# Patient Record
Sex: Female | Born: 1990 | Race: Black or African American | Hispanic: No | Marital: Single | Smoking: Former smoker
Health system: Southern US, Community
[De-identification: ages and names within clinical notes are randomized; demographics above are authoritative.]

## PROBLEM LIST (undated history)

## (undated) DIAGNOSIS — F32A Depression, unspecified: Secondary | ICD-10-CM

## (undated) DIAGNOSIS — F329 Major depressive disorder, single episode, unspecified: Secondary | ICD-10-CM

## (undated) DIAGNOSIS — H6020 Malignant otitis externa, unspecified ear: Secondary | ICD-10-CM

## (undated) HISTORY — PX: DENTAL EXAMINATION UNDER ANESTHESIA: SHX1447

---
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
## 2008-04-22 ENCOUNTER — Emergency Department (HOSPITAL_COMMUNITY): Admission: EM | Admit: 2008-04-22 | Discharge: 2008-04-22 | Payer: Self-pay | Admitting: Emergency Medicine

## 2008-10-29 ENCOUNTER — Ambulatory Visit: Payer: Self-pay | Admitting: Internal Medicine

## 2008-10-29 ENCOUNTER — Inpatient Hospital Stay (HOSPITAL_COMMUNITY): Admission: EM | Admit: 2008-10-29 | Discharge: 2008-10-30 | Payer: Self-pay | Admitting: Emergency Medicine

## 2008-10-29 ENCOUNTER — Ambulatory Visit: Payer: Self-pay | Admitting: Psychiatry

## 2008-10-30 ENCOUNTER — Encounter: Payer: Self-pay | Admitting: Internal Medicine

## 2009-06-20 ENCOUNTER — Emergency Department (HOSPITAL_COMMUNITY): Admission: EM | Admit: 2009-06-20 | Discharge: 2009-06-20 | Payer: Self-pay | Admitting: Emergency Medicine

## 2010-02-06 ENCOUNTER — Emergency Department (HOSPITAL_COMMUNITY): Admission: EM | Admit: 2010-02-06 | Discharge: 2010-02-06 | Payer: Self-pay | Admitting: Emergency Medicine

## 2010-03-16 ENCOUNTER — Emergency Department (HOSPITAL_COMMUNITY): Admission: EM | Admit: 2010-03-16 | Discharge: 2010-03-16 | Payer: Self-pay | Admitting: Emergency Medicine

## 2010-04-22 ENCOUNTER — Emergency Department (HOSPITAL_COMMUNITY): Admission: EM | Admit: 2010-04-22 | Discharge: 2010-04-22 | Payer: Self-pay | Admitting: Emergency Medicine

## 2010-04-27 ENCOUNTER — Emergency Department (HOSPITAL_COMMUNITY): Admission: EM | Admit: 2010-04-27 | Discharge: 2010-04-27 | Payer: Self-pay | Admitting: Emergency Medicine

## 2010-06-11 ENCOUNTER — Emergency Department (HOSPITAL_COMMUNITY): Admission: EM | Admit: 2010-06-11 | Discharge: 2010-06-12 | Payer: Self-pay | Admitting: Emergency Medicine

## 2010-08-04 ENCOUNTER — Emergency Department (HOSPITAL_COMMUNITY): Admission: EM | Admit: 2010-08-04 | Discharge: 2010-08-04 | Payer: Self-pay | Admitting: Emergency Medicine

## 2010-08-10 ENCOUNTER — Emergency Department (HOSPITAL_COMMUNITY): Admission: EM | Admit: 2010-08-10 | Discharge: 2010-08-10 | Payer: Self-pay | Admitting: Emergency Medicine

## 2010-08-22 ENCOUNTER — Emergency Department (HOSPITAL_COMMUNITY): Admission: EM | Admit: 2010-08-22 | Discharge: 2010-08-22 | Payer: Self-pay | Admitting: Emergency Medicine

## 2010-09-11 ENCOUNTER — Emergency Department (HOSPITAL_COMMUNITY): Admission: EM | Admit: 2010-09-11 | Discharge: 2010-09-11 | Payer: Self-pay | Admitting: Emergency Medicine

## 2010-09-18 ENCOUNTER — Emergency Department (HOSPITAL_COMMUNITY): Admission: EM | Admit: 2010-09-18 | Discharge: 2010-09-18 | Payer: Self-pay | Admitting: Emergency Medicine

## 2010-10-28 ENCOUNTER — Emergency Department (HOSPITAL_COMMUNITY)
Admission: EM | Admit: 2010-10-28 | Discharge: 2010-10-28 | Payer: Self-pay | Source: Home / Self Care | Admitting: Emergency Medicine

## 2010-11-02 ENCOUNTER — Emergency Department (HOSPITAL_COMMUNITY)
Admission: EM | Admit: 2010-11-02 | Discharge: 2010-11-02 | Payer: Self-pay | Source: Home / Self Care | Admitting: Emergency Medicine

## 2010-12-13 ENCOUNTER — Emergency Department (HOSPITAL_COMMUNITY)
Admission: EM | Admit: 2010-12-13 | Discharge: 2010-12-13 | Payer: Self-pay | Source: Home / Self Care | Admitting: Emergency Medicine

## 2010-12-13 LAB — URINE MICROSCOPIC-ADD ON

## 2010-12-13 LAB — DIFFERENTIAL
Basophils Absolute: 0 10*3/uL (ref 0.0–0.1)
Basophils Relative: 0 % (ref 0–1)
Eosinophils Absolute: 0.1 10*3/uL (ref 0.0–0.7)
Eosinophils Relative: 1 % (ref 0–5)
Lymphs Abs: 1.3 10*3/uL (ref 0.7–4.0)
Monocytes Absolute: 0.6 10*3/uL (ref 0.1–1.0)
Monocytes Relative: 8 % (ref 3–12)
Neutro Abs: 5.8 10*3/uL (ref 1.7–7.7)
Neutrophils Relative %: 74 % (ref 43–77)

## 2010-12-13 LAB — COMPREHENSIVE METABOLIC PANEL
ALT: 13 U/L (ref 0–35)
AST: 33 U/L (ref 0–37)
Albumin: 3.9 g/dL (ref 3.5–5.2)
Alkaline Phosphatase: 69 U/L (ref 39–117)
BUN: 13 mg/dL (ref 6–23)
CO2: 25 mEq/L (ref 19–32)
Calcium: 9.3 mg/dL (ref 8.4–10.5)
Chloride: 108 mEq/L (ref 96–112)
Creatinine, Ser: 0.98 mg/dL (ref 0.4–1.2)
GFR calc Af Amer: >60 mL/min (ref >60)
GFR calc non Af Amer: >60 mL/min (ref >60)
Potassium: 4.7 mEq/L (ref 3.5–5.1)
Total Bilirubin: 1.4 mg/dL — ABNORMAL HIGH (ref 0.3–1.2)
Total Protein: 6.6 g/dL (ref 6.0–8.3)

## 2010-12-13 LAB — URINALYSIS, ROUTINE W REFLEX MICROSCOPIC
Hgb urine dipstick: NEGATIVE
Ketones, ur: 15 mg/dL — AB
Leukocytes, UA: NEGATIVE
Protein, ur: 30 mg/dL — AB
Specific Gravity, Urine: 1.023 (ref 1.005–1.030)
Urine Glucose, Fasting: NEGATIVE mg/dL
Urobilinogen, UA: 0.2 mg/dL (ref 0.0–1.0)
pH: 8 (ref 5.0–8.0)

## 2010-12-13 LAB — WET PREP, GENITAL
Trich, Wet Prep: NONE SEEN
Yeast Wet Prep HPF POC: NONE SEEN

## 2010-12-13 LAB — CBC
MCH: 29.9 pg (ref 26.0–34.0)
MCHC: 33.9 g/dL (ref 30.0–36.0)
MCV: 88.1 fL (ref 78.0–100.0)
Platelets: 237 10*3/uL (ref 150–400)
RBC: 4.02 MIL/uL (ref 3.87–5.11)
RDW: 14.3 % (ref 11.5–15.5)

## 2010-12-13 LAB — POCT PREGNANCY, URINE: Preg Test, Ur: NEGATIVE

## 2010-12-14 LAB — GC/CHLAMYDIA PROBE AMP, GENITAL
Chlamydia, DNA Probe: NEGATIVE
GC Probe Amp, Genital: NEGATIVE

## 2011-01-31 LAB — URINALYSIS, ROUTINE W REFLEX MICROSCOPIC
Bilirubin Urine: NEGATIVE
Bilirubin Urine: NEGATIVE
Glucose, UA: NEGATIVE mg/dL
Glucose, UA: NEGATIVE mg/dL
Hgb urine dipstick: NEGATIVE
Hgb urine dipstick: NEGATIVE
Ketones, ur: 40 mg/dL — AB
Ketones, ur: NEGATIVE mg/dL
Nitrite: NEGATIVE
Nitrite: NEGATIVE
Protein, ur: NEGATIVE mg/dL
Protein, ur: NEGATIVE mg/dL
Specific Gravity, Urine: 1.017 (ref 1.005–1.030)
Specific Gravity, Urine: 1.022 (ref 1.005–1.030)
Urobilinogen, UA: 0.2 mg/dL (ref 0.0–1.0)
Urobilinogen, UA: 1 mg/dL (ref 0.0–1.0)
pH: 5.5 (ref 5.0–8.0)
pH: 8.5 — ABNORMAL HIGH (ref 5.0–8.0)

## 2011-01-31 LAB — DIFFERENTIAL
Basophils Relative: 0 % (ref 0–1)
Eosinophils Absolute: 0.1 10*3/uL (ref 0.0–0.7)
Eosinophils Relative: 1 % (ref 0–5)
Monocytes Absolute: 0.8 10*3/uL (ref 0.1–1.0)
Monocytes Relative: 9 % (ref 3–12)
Neutrophils Relative %: 62 % (ref 43–77)

## 2011-01-31 LAB — POCT PREGNANCY, URINE
Preg Test, Ur: NEGATIVE
Preg Test, Ur: NEGATIVE

## 2011-01-31 LAB — CBC
HCT: 36.6 % (ref 36.0–46.0)
Hemoglobin: 12.2 g/dL (ref 12.0–15.0)
MCH: 29.7 pg (ref 26.0–34.0)
MCHC: 33.3 g/dL (ref 30.0–36.0)
MCV: 89.1 fL (ref 78.0–100.0)
Platelets: 309 10*3/uL (ref 150–400)
RBC: 4.11 MIL/uL (ref 3.87–5.11)
RDW: 12.9 % (ref 11.5–15.5)
WBC: 8.3 10*3/uL (ref 4.0–10.5)

## 2011-01-31 LAB — COMPREHENSIVE METABOLIC PANEL
BUN: 13 mg/dL (ref 6–23)
Calcium: 9.2 mg/dL (ref 8.4–10.5)
Creatinine, Ser: 0.74 mg/dL (ref 0.4–1.2)
Glucose, Bld: 85 mg/dL (ref 70–99)
Total Protein: 7.1 g/dL (ref 6.0–8.3)

## 2011-01-31 LAB — LIPASE, BLOOD: Lipase: 22 U/L (ref 11–59)

## 2011-02-01 LAB — COMPREHENSIVE METABOLIC PANEL
ALT: 18 U/L (ref 0–35)
AST: 22 U/L (ref 0–37)
Albumin: 4.1 g/dL (ref 3.5–5.2)
CO2: 27 mEq/L (ref 19–32)
Calcium: 9.8 mg/dL (ref 8.4–10.5)
Chloride: 105 mEq/L (ref 96–112)
GFR calc Af Amer: >60 mL/min (ref >60)
GFR calc non Af Amer: >60 mL/min (ref >60)
Sodium: 139 mEq/L (ref 135–145)

## 2011-02-01 LAB — CBC
Hemoglobin: 12.7 g/dL (ref 12.0–15.0)
MCHC: 34 g/dL (ref 30.0–36.0)
Platelets: 261 10*3/uL (ref 150–400)
RBC: 4.15 MIL/uL (ref 3.87–5.11)

## 2011-02-01 LAB — DIFFERENTIAL
Eosinophils Absolute: 0.1 10*3/uL (ref 0.0–0.7)
Eosinophils Relative: 1 % (ref 0–5)
Lymphs Abs: 2.2 10*3/uL (ref 0.7–4.0)
Monocytes Absolute: 0.8 10*3/uL (ref 0.1–1.0)

## 2011-02-03 LAB — POCT I-STAT, CHEM 8
Calcium, Ion: 1.19 mmol/L (ref 1.12–1.32)
HCT: 43 % (ref 36.0–46.0)
Hemoglobin: 14.6 g/dL (ref 12.0–15.0)
Sodium: 138 mEq/L (ref 135–145)
TCO2: 26 mmol/L (ref 0–100)

## 2011-02-03 LAB — COMPREHENSIVE METABOLIC PANEL
ALT: 20 U/L (ref 0–35)
AST: 24 U/L (ref 0–37)
CO2: 28 mEq/L (ref 19–32)
Calcium: 9.4 mg/dL (ref 8.4–10.5)
Creatinine, Ser: 0.79 mg/dL (ref 0.4–1.2)
GFR calc Af Amer: >60 mL/min (ref >60)
GFR calc non Af Amer: >60 mL/min (ref >60)
Sodium: 137 mEq/L (ref 135–145)
Total Protein: 7.7 g/dL (ref 6.0–8.3)

## 2011-02-03 LAB — URINALYSIS, ROUTINE W REFLEX MICROSCOPIC
Bilirubin Urine: NEGATIVE
Nitrite: NEGATIVE
Specific Gravity, Urine: 1.021 (ref 1.005–1.030)
Urobilinogen, UA: 0.2 mg/dL (ref 0.0–1.0)
pH: 5.5 (ref 5.0–8.0)

## 2011-02-03 LAB — URINE CULTURE: Colony Count: >=100000

## 2011-02-03 LAB — CBC
HCT: 39.6 % (ref 36.0–46.0)
MCH: 31 pg (ref 26.0–34.0)
MCHC: 33.6 g/dL (ref 30.0–36.0)
MCV: 92.3 fL (ref 78.0–100.0)
Platelets: 269 10*3/uL (ref 150–400)
RDW: 14.2 % (ref 11.5–15.5)
WBC: 9.3 10*3/uL (ref 4.0–10.5)

## 2011-02-03 LAB — DIFFERENTIAL
Basophils Absolute: 0 10*3/uL (ref 0.0–0.1)
Basophils Relative: 0 % (ref 0–1)
Eosinophils Absolute: 0.2 10*3/uL (ref 0.0–0.7)
Eosinophils Relative: 3 % (ref 0–5)
Lymphocytes Relative: 21 % (ref 12–46)
Monocytes Absolute: 0.8 10*3/uL (ref 0.1–1.0)

## 2011-02-03 LAB — WET PREP, GENITAL
Trich, Wet Prep: NONE SEEN
Yeast Wet Prep HPF POC: NONE SEEN

## 2011-02-03 LAB — LIPASE, BLOOD: Lipase: 28 U/L (ref 11–59)

## 2011-02-03 LAB — URINE MICROSCOPIC-ADD ON

## 2011-02-14 ENCOUNTER — Emergency Department (HOSPITAL_COMMUNITY): Payer: Self-pay

## 2011-02-14 ENCOUNTER — Emergency Department (HOSPITAL_COMMUNITY)
Admission: EM | Admit: 2011-02-14 | Discharge: 2011-02-14 | Discharge status: Against Medical Advice | Payer: Self-pay | Attending: Emergency Medicine | Admitting: Emergency Medicine

## 2011-02-14 DIAGNOSIS — R071 Chest pain on breathing: Secondary | ICD-10-CM | POA: Insufficient documentation

## 2011-02-14 DIAGNOSIS — R10816 Epigastric abdominal tenderness: Secondary | ICD-10-CM | POA: Insufficient documentation

## 2011-02-14 DIAGNOSIS — R109 Unspecified abdominal pain: Secondary | ICD-10-CM | POA: Insufficient documentation

## 2011-02-25 LAB — BASIC METABOLIC PANEL
CO2: 26 mEq/L (ref 19–32)
Calcium: 8.8 mg/dL (ref 8.4–10.5)
Creatinine, Ser: 0.92 mg/dL (ref 0.4–1.2)
GFR calc Af Amer: >60 mL/min (ref >60)
GFR calc non Af Amer: >60 mL/min (ref >60)
Sodium: 139 mEq/L (ref 135–145)

## 2011-02-25 LAB — URINE MICROSCOPIC-ADD ON

## 2011-02-25 LAB — GC/CHLAMYDIA PROBE AMP, GENITAL
Chlamydia, DNA Probe: NEGATIVE
GC Probe Amp, Genital: NEGATIVE

## 2011-02-25 LAB — URINALYSIS, ROUTINE W REFLEX MICROSCOPIC
Bilirubin Urine: NEGATIVE
Glucose, UA: NEGATIVE mg/dL
Specific Gravity, Urine: 1.028 (ref 1.005–1.030)
pH: 5.5 (ref 5.0–8.0)

## 2011-02-25 LAB — WET PREP, GENITAL: WBC, Wet Prep HPF POC: NONE SEEN

## 2011-02-25 LAB — POCT PREGNANCY, URINE: Preg Test, Ur: NEGATIVE

## 2011-03-05 ENCOUNTER — Emergency Department (HOSPITAL_COMMUNITY)
Admission: EM | Admit: 2011-03-05 | Discharge: 2011-03-05 | Disposition: A | Discharge status: Home / Self Care | Payer: Self-pay | Attending: Emergency Medicine | Admitting: Emergency Medicine

## 2011-03-05 ENCOUNTER — Emergency Department (HOSPITAL_COMMUNITY): Payer: Self-pay

## 2011-03-05 DIAGNOSIS — R1031 Right lower quadrant pain: Secondary | ICD-10-CM | POA: Insufficient documentation

## 2011-03-05 DIAGNOSIS — R112 Nausea with vomiting, unspecified: Secondary | ICD-10-CM | POA: Insufficient documentation

## 2011-03-05 DIAGNOSIS — R509 Fever, unspecified: Secondary | ICD-10-CM | POA: Insufficient documentation

## 2011-03-05 LAB — COMPREHENSIVE METABOLIC PANEL
BUN: 14 mg/dL (ref 6–23)
Calcium: 9.5 mg/dL (ref 8.4–10.5)
Glucose, Bld: 99 mg/dL (ref 70–99)
Total Protein: 7 g/dL (ref 6.0–8.3)

## 2011-03-05 LAB — URINALYSIS, ROUTINE W REFLEX MICROSCOPIC
Glucose, UA: NEGATIVE mg/dL
Ketones, ur: 15 mg/dL — AB
Leukocytes, UA: NEGATIVE
pH: 5.5 (ref 5.0–8.0)

## 2011-03-05 LAB — WET PREP, GENITAL
Trich, Wet Prep: NONE SEEN
Yeast Wet Prep HPF POC: NONE SEEN

## 2011-03-05 LAB — CBC
HCT: 36.2 % (ref 36.0–46.0)
Hemoglobin: 12.1 g/dL (ref 12.0–15.0)
MCH: 29.6 pg (ref 26.0–34.0)
RBC: 4.09 MIL/uL (ref 3.87–5.11)

## 2011-03-05 LAB — URINE MICROSCOPIC-ADD ON

## 2011-03-05 LAB — DIFFERENTIAL
Basophils Absolute: 0 10*3/uL (ref 0.0–0.1)
Lymphocytes Relative: 13 % (ref 12–46)
Lymphs Abs: 1.2 10*3/uL (ref 0.7–4.0)
Monocytes Absolute: 0.5 10*3/uL (ref 0.1–1.0)
Neutro Abs: 7.9 10*3/uL — ABNORMAL HIGH (ref 1.7–7.7)

## 2011-03-05 MED ORDER — IOHEXOL 300 MG/ML  SOLN
100.0000 mL | Freq: Once | INTRAMUSCULAR | Status: AC | PRN
  Administered 2011-03-05: 100 mL via INTRAVENOUS

## 2011-03-06 LAB — GC/CHLAMYDIA PROBE AMP, GENITAL: GC Probe Amp, Genital: NEGATIVE
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
## 2011-05-01 ENCOUNTER — Emergency Department (HOSPITAL_COMMUNITY): Payer: Self-pay

## 2011-05-01 ENCOUNTER — Emergency Department (HOSPITAL_COMMUNITY)
Admission: EM | Admit: 2011-05-01 | Discharge: 2011-05-01 | Disposition: A | Discharge status: Home / Self Care | Payer: Self-pay | Attending: Emergency Medicine | Admitting: Emergency Medicine

## 2011-05-01 DIAGNOSIS — N390 Urinary tract infection, site not specified: Secondary | ICD-10-CM | POA: Insufficient documentation

## 2011-05-01 DIAGNOSIS — R1031 Right lower quadrant pain: Secondary | ICD-10-CM | POA: Insufficient documentation

## 2011-05-01 LAB — COMPREHENSIVE METABOLIC PANEL
Albumin: 3.8 g/dL (ref 3.5–5.2)
Alkaline Phosphatase: 61 U/L (ref 39–117)
BUN: 12 mg/dL (ref 6–23)
Potassium: 3.6 mEq/L (ref 3.5–5.1)
Total Protein: 6.8 g/dL (ref 6.0–8.3)

## 2011-05-01 LAB — URINALYSIS, ROUTINE W REFLEX MICROSCOPIC
Bilirubin Urine: NEGATIVE
Nitrite: NEGATIVE
Specific Gravity, Urine: 1.02 (ref 1.005–1.030)
pH: 7.5 (ref 5.0–8.0)

## 2011-05-01 LAB — DIFFERENTIAL
Basophils Absolute: 0 10*3/uL (ref 0.0–0.1)
Basophils Relative: 0 % (ref 0–1)
Eosinophils Absolute: 0.1 10*3/uL (ref 0.0–0.7)
Eosinophils Relative: 1 % (ref 0–5)
Monocytes Absolute: 0.6 10*3/uL (ref 0.1–1.0)

## 2011-05-01 LAB — URINE MICROSCOPIC-ADD ON

## 2011-05-01 LAB — CBC
HCT: 36.6 % (ref 36.0–46.0)
MCHC: 33.6 g/dL (ref 30.0–36.0)
RDW: 14 % (ref 11.5–15.5)

## 2011-05-01 LAB — POCT PREGNANCY, URINE: Preg Test, Ur: NEGATIVE

## 2011-05-02 ENCOUNTER — Other Ambulatory Visit (HOSPITAL_COMMUNITY): Payer: Self-pay | Admitting: Emergency Medicine

## 2011-05-02 ENCOUNTER — Ambulatory Visit (HOSPITAL_COMMUNITY)
Admit: 2011-05-02 | Discharge: 2011-05-02 | Disposition: A | Discharge status: Home / Self Care | Payer: Self-pay | Attending: Emergency Medicine | Admitting: Emergency Medicine

## 2011-05-02 DIAGNOSIS — D259 Leiomyoma of uterus, unspecified: Secondary | ICD-10-CM | POA: Insufficient documentation

## 2011-05-02 DIAGNOSIS — R1031 Right lower quadrant pain: Secondary | ICD-10-CM | POA: Insufficient documentation

## 2011-05-02 DIAGNOSIS — N83209 Unspecified ovarian cyst, unspecified side: Secondary | ICD-10-CM

## 2011-07-30 ENCOUNTER — Emergency Department (HOSPITAL_COMMUNITY)
Admission: EM | Admit: 2011-07-30 | Discharge: 2011-07-31 | Discharge status: Against Medical Advice | Payer: Self-pay | Attending: Emergency Medicine | Admitting: Emergency Medicine

## 2011-07-30 DIAGNOSIS — R110 Nausea: Secondary | ICD-10-CM | POA: Insufficient documentation

## 2011-07-30 DIAGNOSIS — R1031 Right lower quadrant pain: Secondary | ICD-10-CM | POA: Insufficient documentation

## 2011-07-30 DIAGNOSIS — R109 Unspecified abdominal pain: Secondary | ICD-10-CM | POA: Insufficient documentation

## 2011-07-30 LAB — URINALYSIS, ROUTINE W REFLEX MICROSCOPIC
Bilirubin Urine: NEGATIVE
Glucose, UA: NEGATIVE mg/dL
Ketones, ur: NEGATIVE mg/dL
Protein, ur: NEGATIVE mg/dL

## 2011-08-01 ENCOUNTER — Emergency Department (HOSPITAL_COMMUNITY)
Admission: EM | Admit: 2011-08-01 | Discharge: 2011-08-02 | Disposition: A | Discharge status: Home / Self Care | Payer: Self-pay | Attending: Emergency Medicine | Admitting: Emergency Medicine

## 2011-08-01 ENCOUNTER — Emergency Department (HOSPITAL_COMMUNITY): Payer: Self-pay

## 2011-08-01 DIAGNOSIS — R55 Syncope and collapse: Secondary | ICD-10-CM | POA: Insufficient documentation

## 2011-08-01 DIAGNOSIS — R110 Nausea: Secondary | ICD-10-CM | POA: Insufficient documentation

## 2011-08-01 DIAGNOSIS — R51 Headache: Secondary | ICD-10-CM | POA: Insufficient documentation

## 2011-08-01 DIAGNOSIS — G8929 Other chronic pain: Secondary | ICD-10-CM | POA: Insufficient documentation

## 2011-08-01 DIAGNOSIS — R109 Unspecified abdominal pain: Secondary | ICD-10-CM | POA: Insufficient documentation

## 2011-08-01 LAB — CBC
HCT: 35.7 % — ABNORMAL LOW (ref 36.0–46.0)
MCH: 30.8 pg (ref 26.0–34.0)
MCHC: 35 g/dL (ref 30.0–36.0)
MCV: 87.9 fL (ref 78.0–100.0)
RDW: 12.5 % (ref 11.5–15.5)
WBC: 5.8 10*3/uL (ref 4.0–10.5)

## 2011-08-01 LAB — COMPREHENSIVE METABOLIC PANEL
Albumin: 4.1 g/dL (ref 3.5–5.2)
BUN: 11 mg/dL (ref 6–23)
Chloride: 103 mEq/L (ref 96–112)
Creatinine, Ser: 0.89 mg/dL (ref 0.50–1.10)
Total Bilirubin: 0.7 mg/dL (ref 0.3–1.2)

## 2011-08-01 LAB — WET PREP, GENITAL
Trich, Wet Prep: NONE SEEN
Yeast Wet Prep HPF POC: NONE SEEN

## 2011-08-01 LAB — DIFFERENTIAL
Lymphocytes Relative: 39 % (ref 12–46)
Monocytes Absolute: 0.4 10*3/uL (ref 0.1–1.0)
Monocytes Relative: 7 % (ref 3–12)
Neutro Abs: 3 10*3/uL (ref 1.7–7.7)

## 2011-08-02 LAB — POCT PREGNANCY, URINE: Preg Test, Ur: NEGATIVE

## 2011-08-02 LAB — URINALYSIS, ROUTINE W REFLEX MICROSCOPIC
Bilirubin Urine: NEGATIVE
Glucose, UA: NEGATIVE mg/dL
Ketones, ur: NEGATIVE mg/dL
Nitrite: NEGATIVE
pH: 6 (ref 5.0–8.0)

## 2011-08-02 LAB — URINE MICROSCOPIC-ADD ON

## 2011-08-03 LAB — GC/CHLAMYDIA PROBE AMP, GENITAL: GC Probe Amp, Genital: POSITIVE — AB

## 2011-08-12 ENCOUNTER — Emergency Department (HOSPITAL_COMMUNITY)
Admission: EM | Admit: 2011-08-12 | Discharge: 2011-08-12 | Disposition: A | Discharge status: Home / Self Care | Payer: Self-pay | Attending: Emergency Medicine | Admitting: Emergency Medicine

## 2011-08-12 ENCOUNTER — Emergency Department (HOSPITAL_COMMUNITY): Payer: Self-pay

## 2011-08-12 DIAGNOSIS — IMO0002 Reserved for concepts with insufficient information to code with codable children: Secondary | ICD-10-CM | POA: Insufficient documentation

## 2011-08-12 DIAGNOSIS — M25469 Effusion, unspecified knee: Secondary | ICD-10-CM | POA: Insufficient documentation

## 2011-08-12 DIAGNOSIS — S8000XA Contusion of unspecified knee, initial encounter: Secondary | ICD-10-CM | POA: Insufficient documentation

## 2011-08-12 DIAGNOSIS — M25569 Pain in unspecified knee: Secondary | ICD-10-CM | POA: Insufficient documentation

## 2011-08-16 LAB — URINALYSIS, ROUTINE W REFLEX MICROSCOPIC
Glucose, UA: NEGATIVE
Leukocytes, UA: NEGATIVE
Nitrite: NEGATIVE
Protein, ur: NEGATIVE
pH: 6

## 2011-08-16 LAB — URINE MICROSCOPIC-ADD ON

## 2011-08-16 LAB — DIFFERENTIAL
Basophils Absolute: 0
Lymphocytes Relative: 9 — ABNORMAL LOW
Neutro Abs: 7.8 — ABNORMAL HIGH
Neutrophils Relative %: 84 — ABNORMAL HIGH

## 2011-08-16 LAB — POCT I-STAT, CHEM 8
BUN: 13
Chloride: 104
Sodium: 139
TCO2: 24

## 2011-08-16 LAB — POCT PREGNANCY, URINE
Operator id: 295021
Preg Test, Ur: NEGATIVE

## 2011-08-16 LAB — CBC
Platelets: 212
RDW: 14.1

## 2011-08-24 LAB — CBC
HCT: 32.3 % — ABNORMAL LOW (ref 36.0–46.0)
HCT: 35.3 % — ABNORMAL LOW (ref 36.0–46.0)
MCHC: 32.6 g/dL (ref 30.0–36.0)
MCHC: 33.3 g/dL (ref 30.0–36.0)
MCV: 89 fL (ref 78.0–100.0)
MCV: 90.8 fL (ref 78.0–100.0)
Platelets: 255 10*3/uL (ref 150–400)
Platelets: 292 10*3/uL (ref 150–400)
RDW: 15.1 % (ref 11.5–15.5)

## 2011-08-24 LAB — FOLATE: Folate: 12.8 ng/mL

## 2011-08-24 LAB — POCT I-STAT, CHEM 8
Calcium, Ion: 1.2 mmol/L (ref 1.12–1.32)
Creatinine, Ser: 1.4 mg/dL — ABNORMAL HIGH (ref 0.4–1.2)
Glucose, Bld: 92 mg/dL (ref 70–99)
HCT: 37 % (ref 36.0–46.0)
Hemoglobin: 12.6 g/dL (ref 12.0–15.0)
TCO2: 25 mmol/L (ref 0–100)

## 2011-08-24 LAB — TRICYCLIC ANTIDEPRESSANT EVALUATION
Amitrip+Nortrip: <5 mcg/L — ABNORMAL LOW (ref 120–250)
Amitriptyline: <5 mcg/L
Clomipramine.: <5 ng/mL
Desipramine: <5 mcg/L
Doxepin: <5 mcg/L
Imipram+Desipr Total: <5 mcg/L — ABNORMAL LOW (ref 150–300)
Imipramine Lvl: <5 mcg/L
Nortriptyline Lvl: <5 mcg/L

## 2011-08-24 LAB — RAPID URINE DRUG SCREEN, HOSP PERFORMED
Amphetamines: NOT DETECTED
Benzodiazepines: NOT DETECTED
Opiates: NOT DETECTED
Tetrahydrocannabinol: NOT DETECTED

## 2011-08-24 LAB — COMPREHENSIVE METABOLIC PANEL
AST: 21 U/L (ref 0–37)
Albumin: 3.1 g/dL — ABNORMAL LOW (ref 3.5–5.2)
BUN: 4 mg/dL — ABNORMAL LOW (ref 6–23)
Calcium: 8.5 mg/dL (ref 8.4–10.5)
Chloride: 107 mEq/L (ref 96–112)
Creatinine, Ser: 0.99 mg/dL (ref 0.4–1.2)
GFR calc Af Amer: >60 mL/min (ref >60)
GFR calc non Af Amer: >60 mL/min (ref >60)
Total Bilirubin: 0.5 mg/dL (ref 0.3–1.2)

## 2011-08-24 LAB — ACETAMINOPHEN LEVEL: Acetaminophen (Tylenol), Serum: <10 ug/mL — ABNORMAL LOW (ref 10–30)

## 2011-08-24 LAB — DIFFERENTIAL
Basophils Absolute: 0.1 10*3/uL (ref 0.0–0.1)
Eosinophils Absolute: 0.1 10*3/uL (ref 0.0–0.7)
Lymphs Abs: 2.2 10*3/uL (ref 0.7–4.0)
Monocytes Absolute: 0.6 10*3/uL (ref 0.1–1.0)
Monocytes Relative: 9 % (ref 3–12)
Neutro Abs: 3.9 10*3/uL (ref 1.7–7.7)

## 2011-08-24 LAB — BASIC METABOLIC PANEL
BUN: 15 mg/dL (ref 6–23)
CO2: 23 mEq/L (ref 19–32)
Calcium: 9.1 mg/dL (ref 8.4–10.5)
Creatinine, Ser: 0.92 mg/dL (ref 0.4–1.2)
GFR calc non Af Amer: >60 mL/min (ref >60)
Glucose, Bld: 119 mg/dL — ABNORMAL HIGH (ref 70–99)
Sodium: 143 mEq/L (ref 135–145)

## 2011-08-24 LAB — HEPATIC FUNCTION PANEL
ALT: 16 U/L (ref 0–35)
Bilirubin, Direct: 0.2 mg/dL (ref 0.0–0.3)
Indirect Bilirubin: 0.6 mg/dL (ref 0.3–0.9)
Total Bilirubin: 0.8 mg/dL (ref 0.3–1.2)

## 2011-08-24 LAB — TROPONIN I: Troponin I: <0.01 ng/mL (ref 0.00–0.06)

## 2011-08-24 LAB — RETICULOCYTES
RBC.: 3.67 MIL/uL — ABNORMAL LOW (ref 3.87–5.11)
Retic Ct Pct: 0.9 % (ref 0.4–3.1)

## 2011-08-24 LAB — CK TOTAL AND CKMB (NOT AT ARMC)
CK, MB: 3.4 ng/mL (ref 0.3–4.0)
Total CK: 257 U/L — ABNORMAL HIGH (ref 7–177)

## 2011-08-24 LAB — ETHANOL: Alcohol, Ethyl (B): <5 mg/dL (ref 0–10)

## 2011-08-24 LAB — APTT: aPTT: 29 seconds (ref 24–37)

## 2011-08-24 LAB — IRON AND TIBC
Saturation Ratios: 10 % — ABNORMAL LOW (ref 20–55)
TIBC: 304 ug/dL (ref 250–470)
UIBC: 273 ug/dL

## 2011-08-24 LAB — PROTIME-INR: Prothrombin Time: 18.6 seconds — ABNORMAL HIGH (ref 11.6–15.2)

## 2011-09-24 ENCOUNTER — Emergency Department (HOSPITAL_COMMUNITY)
Admission: EM | Admit: 2011-09-24 | Discharge: 2011-09-24 | Discharge status: Against Medical Advice | Payer: Self-pay | Attending: Emergency Medicine | Admitting: Emergency Medicine

## 2011-09-24 DIAGNOSIS — R109 Unspecified abdominal pain: Secondary | ICD-10-CM | POA: Insufficient documentation

## 2011-09-24 NOTE — ED Notes (Signed)
Per EMS pt from home, pt c/o sudden onset RLQ abd pain x3 hrs. VSS bp 132/66, hr 88, rr 18

## 2011-10-23 ENCOUNTER — Emergency Department (HOSPITAL_COMMUNITY): Payer: Self-pay

## 2011-10-23 ENCOUNTER — Emergency Department (HOSPITAL_COMMUNITY)
Admission: EM | Admit: 2011-10-23 | Discharge: 2011-10-23 | Disposition: A | Discharge status: Home / Self Care | Payer: Self-pay | Attending: Emergency Medicine | Admitting: Emergency Medicine

## 2011-10-23 DIAGNOSIS — J111 Influenza due to unidentified influenza virus with other respiratory manifestations: Secondary | ICD-10-CM | POA: Insufficient documentation

## 2011-10-23 DIAGNOSIS — R059 Cough, unspecified: Secondary | ICD-10-CM | POA: Insufficient documentation

## 2011-10-23 DIAGNOSIS — M549 Dorsalgia, unspecified: Secondary | ICD-10-CM | POA: Insufficient documentation

## 2011-10-23 DIAGNOSIS — R5381 Other malaise: Secondary | ICD-10-CM | POA: Insufficient documentation

## 2011-10-23 DIAGNOSIS — R6883 Chills (without fever): Secondary | ICD-10-CM | POA: Insufficient documentation

## 2011-10-23 DIAGNOSIS — R079 Chest pain, unspecified: Secondary | ICD-10-CM | POA: Insufficient documentation

## 2011-10-23 DIAGNOSIS — R42 Dizziness and giddiness: Secondary | ICD-10-CM | POA: Insufficient documentation

## 2011-10-23 DIAGNOSIS — IMO0001 Reserved for inherently not codable concepts without codable children: Secondary | ICD-10-CM | POA: Insufficient documentation

## 2011-10-23 DIAGNOSIS — R05 Cough: Secondary | ICD-10-CM | POA: Insufficient documentation

## 2011-10-23 LAB — URINALYSIS, ROUTINE W REFLEX MICROSCOPIC
Ketones, ur: 15 mg/dL — AB
Leukocytes, UA: NEGATIVE
Nitrite: NEGATIVE
Protein, ur: NEGATIVE mg/dL

## 2011-10-23 MED ORDER — OSELTAMIVIR PHOSPHATE 75 MG PO CAPS: 75.0000 mg | ORAL_CAPSULE | Freq: Two times a day (BID) | ORAL | Status: AC

## 2011-10-23 MED ORDER — ACETAMINOPHEN 325 MG PO TABS
650.0000 mg | ORAL_TABLET | Freq: Once | ORAL | Status: AC
  Administered 2011-10-23: 650 mg via ORAL
  Filled 2011-10-23: qty 2

## 2011-10-23 NOTE — ED Provider Notes (Signed)
History     CSN: [id number] Arrival date & time: 10/23/2011  2:10 PM   First MD Initiated Contact with Patient 10/23/11 1543      Chief Complaint  Patient presents with  . Chest Pain    associated with cough,   . Back Pain  . Dizziness   patient has had diffuse pain throughout the chest and upper back. She's had decreased appetite, back pain and chills. She feels this is all stemming from her currently acquired cough, which has been intermittently productive. She was using Robitussin at home. Patient has had no fevers, but some malaise and body aches. The chest pain has been nonexertional. Persistent cough with no dyspnea. No distal edema. Patient is requesting food and drink.  (Consider location/radiation/quality/duration/timing/severity/associated sxs/prior treatment) HPI  History reviewed. No pertinent past medical history.  History reviewed. No pertinent past surgical history.  History reviewed. No pertinent family history.  History  Substance Use Topics  . Smoking status: Never Smoker   . Smokeless tobacco: Not on file  . Alcohol Use: No    OB History    Grav Para Term Preterm Abortions TAB SAB Ect Mult Living                  Review of Systems  All other systems reviewed and are negative.    Allergies  Review of patient's allergies indicates no known allergies.  Home Medications   Current Outpatient Rx  Name Route Sig Dispense Refill  . GUAIFENESIN 100 MG/5ML PO SYRP Oral Take 200 mg by mouth 3 (three) times daily as needed. Cough and congestion       BP 122/76  Pulse 114  Resp 16  SpO2 98%  LMP 10/01/2011  Physical Exam  Nursing note and vitals reviewed. Constitutional: She is oriented to person, place, and time. She appears well-developed and well-nourished.  HENT:  Head: Normocephalic and atraumatic.  Eyes: Conjunctivae and EOM are normal. Pupils are equal, round, and reactive to light.  Neck: Neck supple.  Cardiovascular: Normal rate and  regular rhythm.  Exam reveals no gallop and no friction rub.   No murmur heard. Pulmonary/Chest: Breath sounds normal. She has no wheezes. She has no rales. She exhibits no tenderness.       Normal except for persistent cough.  Abdominal: Soft. Bowel sounds are normal. She exhibits no distension. There is no tenderness. There is no rebound and no guarding.  Musculoskeletal: Normal range of motion.  Neurological: She is alert and oriented to person, place, and time. No cranial nerve deficit. Coordination normal.  Skin: Skin is warm and dry. No rash noted.  Psychiatric: She has a normal mood and affect.    ED Course  Procedures (including critical care time)  Labs Reviewed - No data to display Dg Chest 2 View  10/23/2011  *RADIOLOGY REPORT*  Clinical Data: Cough.  Mid chest pain.  CHEST - 2 VIEW 10/23/2011:  Comparison: Two-view chest x-ray 08/19/2006 [facility].  Findings: Cardiomediastinal silhouette unremarkable.  Lungs clear. Bronchovascular markings normal.  Pulmonary vascularity normal.  No pleural effusions.  No pneumothorax.  Visualized bony thorax intact.  No significant interval change.  IMPRESSION: Normal and stable examination.  Original Report Authenticated By: Arnell Sieving, M.D.     No diagnosis found.    MDM  Pt is seen and examined;  Initial history and physical completed.  Will follow.          Damira Kem A. Patrica Duel, MD 10/23/11 2130

## 2011-10-23 NOTE — ED Notes (Signed)
Chest pain, vomiting, no appetite, back pain, chills, started on Saturday.

## 2011-10-23 NOTE — ED Notes (Signed)
Pt unable to void at this time. 

## 2012-02-13 ENCOUNTER — Emergency Department (HOSPITAL_COMMUNITY)
Admission: EM | Admit: 2012-02-13 | Discharge: 2012-02-14 | Disposition: A | Discharge status: Home / Self Care | Payer: Self-pay | Attending: Emergency Medicine | Admitting: Emergency Medicine

## 2012-02-13 DIAGNOSIS — S86009A Unspecified injury of unspecified Achilles tendon, initial encounter: Secondary | ICD-10-CM

## 2012-02-13 DIAGNOSIS — S99919A Unspecified injury of unspecified ankle, initial encounter: Secondary | ICD-10-CM | POA: Insufficient documentation

## 2012-02-13 DIAGNOSIS — M25579 Pain in unspecified ankle and joints of unspecified foot: Secondary | ICD-10-CM | POA: Insufficient documentation

## 2012-02-13 DIAGNOSIS — S8990XA Unspecified injury of unspecified lower leg, initial encounter: Secondary | ICD-10-CM | POA: Insufficient documentation

## 2012-02-13 DIAGNOSIS — M7989 Other specified soft tissue disorders: Secondary | ICD-10-CM | POA: Insufficient documentation
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
## 2012-02-14 ENCOUNTER — Encounter (HOSPITAL_COMMUNITY): Payer: Self-pay | Admitting: Emergency Medicine

## 2012-02-14 ENCOUNTER — Emergency Department (HOSPITAL_COMMUNITY): Payer: Self-pay

## 2012-02-14 MED ORDER — OXYCODONE-ACETAMINOPHEN 5-325 MG PO TABS: 1.0000 | ORAL_TABLET | Freq: Four times a day (QID) | ORAL | Status: AC | PRN

## 2012-02-14 MED ORDER — BACITRACIN ZINC 500 UNIT/GM EX OINT
TOPICAL_OINTMENT | CUTANEOUS | Status: AC
  Administered 2012-02-14: 1
  Filled 2012-02-14: qty 0.9

## 2012-02-14 MED ORDER — OXYCODONE-ACETAMINOPHEN 5-325 MG PO TABS
1.0000 | ORAL_TABLET | Freq: Once | ORAL | Status: AC
  Administered 2012-02-14: 1 via ORAL
  Filled 2012-02-14: qty 1
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
## 2012-02-14 NOTE — ED Provider Notes (Signed)
Medical screening examination/treatment/procedure(s) were performed by non-physician practitioner and as supervising physician I was immediately available for consultation/collaboration.  Vinnie Gombert K Sherrell Farish-Rasch, MD 02/14/12 0103 

## 2012-02-14 NOTE — Discharge Instructions (Signed)
You have been placed in a splint for comfort and to preserve function please call Dr. Rennis Chris in the morning  To set an appointment for further evalaution  Your exam is consistent with a achilles tendon injury

## 2012-02-15 ENCOUNTER — Emergency Department (HOSPITAL_COMMUNITY)
Admission: EM | Admit: 2012-02-15 | Discharge: 2012-02-15 | Disposition: A | Discharge status: Home / Self Care | Payer: Self-pay | Attending: Emergency Medicine | Admitting: Emergency Medicine

## 2012-02-15 DIAGNOSIS — S96819A Strain of other specified muscles and tendons at ankle and foot level, unspecified foot, initial encounter: Secondary | ICD-10-CM | POA: Insufficient documentation

## 2012-02-15 DIAGNOSIS — F10929 Alcohol use, unspecified with intoxication, unspecified: Secondary | ICD-10-CM

## 2012-02-15 DIAGNOSIS — S93499A Sprain of other ligament of unspecified ankle, initial encounter: Secondary | ICD-10-CM | POA: Insufficient documentation

## 2012-02-15 DIAGNOSIS — S86011A Strain of right Achilles tendon, initial encounter: Secondary | ICD-10-CM

## 2012-02-15 DIAGNOSIS — X58XXXA Exposure to other specified factors, initial encounter: Secondary | ICD-10-CM | POA: Insufficient documentation

## 2012-02-15 DIAGNOSIS — F1010 Alcohol abuse, uncomplicated: Secondary | ICD-10-CM | POA: Insufficient documentation

## 2012-02-15 LAB — RAPID URINE DRUG SCREEN, HOSP PERFORMED
Amphetamines: NOT DETECTED
Barbiturates: NOT DETECTED
Benzodiazepines: NOT DETECTED
Cocaine: NOT DETECTED
Opiates: NOT DETECTED
Tetrahydrocannabinol: NOT DETECTED

## 2012-02-15 LAB — ETHANOL: Alcohol, Ethyl (B): 194 mg/dL — ABNORMAL HIGH (ref 0–11)

## 2012-02-15 LAB — ACETAMINOPHEN LEVEL: Acetaminophen (Tylenol), Serum: <15 ug/mL (ref 10–30)

## 2012-02-15 MED ORDER — SODIUM CHLORIDE 0.9 % IV SOLN
Freq: Once | INTRAVENOUS | Status: AC
  Administered 2012-02-15: 04:00:00 via INTRAVENOUS

## 2012-02-15 MED ORDER — PANTOPRAZOLE SODIUM 40 MG IV SOLR
40.0000 mg | Freq: Once | INTRAVENOUS | Status: AC
  Administered 2012-02-15: 40 mg via INTRAVENOUS
  Filled 2012-02-15: qty 40

## 2012-02-15 MED ORDER — SODIUM CHLORIDE 0.9 % IV BOLUS (SEPSIS)
1000.0000 mL | Freq: Once | INTRAVENOUS | Status: AC
  Administered 2012-02-15: 1000 mL via INTRAVENOUS

## 2012-02-15 MED ORDER — NALOXONE HCL 0.4 MG/ML IJ SOLN
0.4000 mg | INTRAMUSCULAR | Status: DC | PRN
  Administered 2012-02-15: 0.4 mg via INTRAVENOUS
  Filled 2012-02-15: qty 1

## 2012-02-15 MED ORDER — ONDANSETRON HCL 4 MG/2ML IJ SOLN
4.0000 mg | Freq: Once | INTRAMUSCULAR | Status: AC
  Administered 2012-02-15: 4 mg via INTRAVENOUS
  Filled 2012-02-15: qty 2

## 2012-02-15 NOTE — ED Notes (Signed)
No urine in pt's catheter. VSS. Dr. Read Drivers aware. Further orders received.

## 2012-02-15 NOTE — ED Notes (Signed)
MD at bedside. Dr. Read Drivers at bedside to place splint on pt's R foot.
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
## 2012-02-15 NOTE — ED Notes (Signed)
MD at bedside. Dr. Molpus at bedside.  
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
## 2012-02-15 NOTE — ED Notes (Signed)
Pt awakens readily. A/o x 4.

## 2012-03-23 ENCOUNTER — Emergency Department (HOSPITAL_COMMUNITY)
Admission: EM | Admit: 2012-03-23 | Discharge: 2012-03-23 | Disposition: A | Discharge status: Home / Self Care | Payer: Self-pay | Attending: Emergency Medicine | Admitting: Emergency Medicine

## 2012-03-23 ENCOUNTER — Encounter (HOSPITAL_COMMUNITY): Payer: Self-pay | Admitting: *Deleted

## 2012-03-23 DIAGNOSIS — R1031 Right lower quadrant pain: Secondary | ICD-10-CM | POA: Insufficient documentation

## 2012-03-23 DIAGNOSIS — N898 Other specified noninflammatory disorders of vagina: Secondary | ICD-10-CM | POA: Insufficient documentation

## 2012-03-23 DIAGNOSIS — R112 Nausea with vomiting, unspecified: Secondary | ICD-10-CM | POA: Insufficient documentation

## 2012-03-23 DIAGNOSIS — R10813 Right lower quadrant abdominal tenderness: Secondary | ICD-10-CM | POA: Insufficient documentation

## 2012-03-23 LAB — POCT PREGNANCY, URINE: Preg Test, Ur: NEGATIVE

## 2012-03-23 LAB — URINE MICROSCOPIC-ADD ON

## 2012-03-23 LAB — BASIC METABOLIC PANEL
Chloride: 103 mEq/L (ref 96–112)
Creatinine, Ser: 0.84 mg/dL (ref 0.50–1.10)
GFR calc Af Amer: >90 mL/min (ref >90)
Potassium: 3.4 mEq/L — ABNORMAL LOW (ref 3.5–5.1)

## 2012-03-23 LAB — CBC
MCHC: 34.2 g/dL (ref 30.0–36.0)
Platelets: 224 10*3/uL (ref 150–400)
RDW: 12.8 % (ref 11.5–15.5)

## 2012-03-23 LAB — URINALYSIS, ROUTINE W REFLEX MICROSCOPIC
Bilirubin Urine: NEGATIVE
Glucose, UA: NEGATIVE mg/dL
Ketones, ur: NEGATIVE mg/dL
Protein, ur: NEGATIVE mg/dL
Urobilinogen, UA: 1 mg/dL (ref 0.0–1.0)

## 2012-03-23 LAB — DIFFERENTIAL
Basophils Absolute: 0 10*3/uL (ref 0.0–0.1)
Basophils Relative: 0 % (ref 0–1)
Neutro Abs: 5 10*3/uL (ref 1.7–7.7)
Neutrophils Relative %: 66 % (ref 43–77)

## 2012-03-23 LAB — WET PREP, GENITAL: Yeast Wet Prep HPF POC: NONE SEEN

## 2012-03-23 MED ORDER — OXYCODONE-ACETAMINOPHEN 5-325 MG PO TABS
2.0000 | ORAL_TABLET | Freq: Once | ORAL | Status: AC
  Administered 2012-03-23: 2 via ORAL
  Filled 2012-03-23: qty 2

## 2012-03-23 MED ORDER — KETOROLAC TROMETHAMINE 30 MG/ML IJ SOLN
30.0000 mg | Freq: Once | INTRAMUSCULAR | Status: AC
  Administered 2012-03-23: 30 mg via INTRAVENOUS
  Filled 2012-03-23: qty 1

## 2012-03-23 MED ORDER — HYDROCODONE-ACETAMINOPHEN 5-500 MG PO TABS: 1.0000 | ORAL_TABLET | Freq: Four times a day (QID) | ORAL | Status: AC | PRN

## 2012-03-23 MED ORDER — ONDANSETRON HCL 4 MG/2ML IJ SOLN
4.0000 mg | Freq: Once | INTRAMUSCULAR | Status: AC
  Administered 2012-03-23: 4 mg via INTRAVENOUS
  Filled 2012-03-23: qty 2

## 2012-03-23 MED ORDER — IBUPROFEN 800 MG PO TABS: 800.0000 mg | ORAL_TABLET | Freq: Three times a day (TID) | ORAL | Status: AC

## 2012-03-23 MED ORDER — MORPHINE SULFATE 4 MG/ML IJ SOLN
4.0000 mg | Freq: Once | INTRAMUSCULAR | Status: AC
  Administered 2012-03-23: 4 mg via INTRAVENOUS
  Filled 2012-03-23: qty 1

## 2012-03-23 NOTE — ED Notes (Signed)
Reports RLQ pain that started last night with n/v, denies diarrhea.

## 2012-03-23 NOTE — ED Provider Notes (Signed)
History     CSN: [id number]  Arrival date & time 03/23/12  7829   First MD Initiated Contact with Patient 03/23/12 [phone]      Chief Complaint  Patient presents with  . Abdominal Pain  . Emesis    (Consider location/radiation/quality/duration/timing/severity/associated sxs/prior treatment) HPI  Patient presents to emergency department complaining of gradual onset right lower quadrant pain that began last night. Patient states pain is associated with nausea and vomiting but denies association of diarrhea. Patient states this is the third episode of pain last few months. Patient states right lower quadrant pain is similar to the right lower quadrant abdominal pain she's had previously twice over last few months. Patient states that she presented to the emergency department both of those times with a negative workup. Patient states she followed up with a GI specialist, of unknown name, and cannot find origin of pain. Patient states her last menstrual period ended last week however she is currently spotting.  History reviewed. No pertinent past medical history.  History reviewed. No pertinent past surgical history.  History reviewed. No pertinent family history.  History  Substance Use Topics  . Smoking status: Never Smoker   . Smokeless tobacco: Not on file  . Alcohol Use: No    OB History    Grav Para Term Preterm Abortions TAB SAB Ect Mult Living                  Review of Systems  Allergies  Review of patient's allergies indicates no known allergies.  Home Medications  No current outpatient prescriptions on file.  BP 136/78  Pulse 63  Temp(Src) 97.6 F (36.4 C) (Oral)  Resp 20  SpO2 98%  Physical Exam  Nursing note and vitals reviewed. Constitutional: She is oriented to person, place, and time. She appears well-developed and well-nourished. No distress.  HENT:  Head: Normocephalic and atraumatic.  Eyes: Conjunctivae are normal.  Neck: Normal range of motion.  Neck supple.  Cardiovascular: Normal rate, regular rhythm, normal heart sounds and intact distal pulses.  Exam reveals no gallop and no friction rub.   No murmur heard. Pulmonary/Chest: Effort normal and breath sounds normal. No respiratory distress. She has no wheezes. She has no rales. She exhibits no tenderness.  Abdominal: Soft. Bowel sounds are normal. She exhibits no distension and no mass. There is tenderness. There is no rebound and no guarding.       TTP of RLQ without peritoneal signs, rigidity or guarding  Genitourinary: There is no rash, tenderness or lesion on the right labia. There is no rash, tenderness or lesion on the left labia. Uterus is not tender. Cervix exhibits no motion tenderness. Right adnexum displays no mass, no tenderness and no fullness. Left adnexum displays no mass, no tenderness and no fullness.       Moderate amount of blood in vaginal vault with blood coming from closed nulliparous cervix.   Musculoskeletal: Normal range of motion. She exhibits no edema and no tenderness.  Neurological: She is alert and oriented to person, place, and time.  Skin: Skin is warm and dry. No rash noted. She is not diaphoretic. No erythema.  Psychiatric: She has a normal mood and affect.    ED Course  Procedures (including critical care time)  IV morphine and zofran  Patient complaining of ongoing pain  IV toradol, abdomen is non acute.   Labs Reviewed  URINALYSIS, ROUTINE W REFLEX MICROSCOPIC - Abnormal; Notable for the following:    Hgb  urine dipstick LARGE (*)    All other components within normal limits  WET PREP, GENITAL - Abnormal; Notable for the following:    Clue Cells Wet Prep HPF POC FEW (*)    WBC, Wet Prep HPF POC FEW (*)    All other components within normal limits  BASIC METABOLIC PANEL - Abnormal; Notable for the following:    Potassium 3.4 (*)    Glucose, Bld 137 (*)    All other components within normal limits  URINE MICROSCOPIC-ADD ON - Abnormal;  Notable for the following:    Squamous Epithelial / LPF FEW (*)    All other components within normal limits  CBC  DIFFERENTIAL  POCT PREGNANCY, URINE  GC/CHLAMYDIA PROBE AMP, GENITAL   No results found.   1. Abdominal pain       MDM  Patient is afebrile and non toxic appearing. Patient has recurrent similar abdominal pain with multiple episodes over the last few months with negative CT scan and ultrasound in the past. She has right lower quadrant tenderness to palpation but she is no rigidity or peritoneal signs. non acute abdomen. No cervical motion tenderness to palpation or adnexal fullness or tenderness. She does have ongoing vaginal bleeding associated with her menstruation. I spoke at length with patient about the need for following up with gynecology for further evaluation and management of recurrent lower bowel pain stating that she has seen a GI specialist without any conclusive finding for her pain. At this time I think that her pain could be associated with menstruation. Spoke at length about 10 year worsening symptoms that should prompt immediate return to emergency department. Patient voices her understanding and is agreeable to plan.        Jenness Corner, [place] 03/23/12 [phone]

## 2012-03-24 LAB — GC/CHLAMYDIA PROBE AMP, GENITAL
Chlamydia, DNA Probe: NEGATIVE
GC Probe Amp, Genital: NEGATIVE

## 2012-03-24 NOTE — ED Provider Notes (Signed)
Medical screening examination/treatment/procedure(s) were performed by non-physician practitioner and as supervising physician I was immediately available for consultation/collaboration.  Geoffery Lyons, MD 03/24/12 [phone]

## 2012-04-04 ENCOUNTER — Encounter (HOSPITAL_COMMUNITY): Payer: Self-pay

## 2012-04-04 ENCOUNTER — Emergency Department (HOSPITAL_COMMUNITY)
Admission: EM | Admit: 2012-04-04 | Discharge: 2012-04-04 | Disposition: A | Discharge status: Home / Self Care | Payer: Self-pay | Attending: Emergency Medicine | Admitting: Emergency Medicine

## 2012-04-04 DIAGNOSIS — H6690 Otitis media, unspecified, unspecified ear: Secondary | ICD-10-CM | POA: Insufficient documentation

## 2012-04-04 MED ORDER — AMOXICILLIN 500 MG PO CAPS: 500.0000 mg | ORAL_CAPSULE | Freq: Three times a day (TID) | ORAL | Status: AC

## 2012-04-04 NOTE — ED Notes (Signed)
Unable to hear from right ear, onset three days ago.

## 2012-04-04 NOTE — Discharge Instructions (Signed)
See the ear Dr. if unable to hear from the right ear after 5 or 6 days. Use a decongestant like Afrin or Sudafed to help the ear drain.  Otitis Media, Adult A middle ear infection is an infection in the space behind the eardrum. The medical name for this is "otitis media." It may happen after a common cold. It is caused by a germ that starts growing in that space. You may feel swollen glands in your neck on the side of the ear infection. HOME CARE INSTRUCTIONS   Take your medicine as directed until it is gone, even if you feel better after the first few days.   Only take over-the-counter or prescription medicines for pain, discomfort, or fever as directed by your caregiver.   Occasional use of a nasal decongestant a couple times per day may help with discomfort and help the eustachian tube to drain better.  Follow up with your caregiver in 10 to 14 days or as directed, to be certain that the infection has cleared. Not keeping the appointment could result in a chronic or permanent injury, pain, hearing loss and disability. If there is any problem keeping the appointment, you must call back to this facility for assistance. SEEK IMMEDIATE MEDICAL CARE IF:   You are not getting better in 2 to 3 days.   You have pain that is not controlled with medication.   You feel worse instead of better.   You cannot use the medication as directed.   You develop swelling, redness or pain around the ear or stiffness in your neck.  MAKE SURE YOU:   Understand these instructions.   Will watch your condition.   Will get help right away if you are not doing well or get worse.  Document Released: 08/10/2004 Document Revised: 10/25/2011 Document Reviewed: 06/11/2008 [facility] Patient Information 2012 [place], [place].

## 2012-04-04 NOTE — ED Provider Notes (Signed)
History  This chart was scribed for Maria Melter, MD by Bennett Scrape. This patient was seen in room STRE5/STRE5 and the patient's care was started at 1:40PM.  CSN: [id number]  Arrival date & time 04/04/12  1202   First MD Initiated Contact with Patient 04/04/12 1340      Chief Complaint  Patient presents with  . Hearing Problem     The history is provided by the patient. No language interpreter was used.    Maria Sampson is a 21 y.o. female who presents to the Emergency Department complaining of 3 days of right ear discomfort described as aching with associated mild loss of hearing described as voices being muffled. She states that the pain is worse with tilting her head to the right. She has not tried any at home medications to improve her symptoms. She denies use of Q-tips or injury as the cause of the pain. She denies nasal congestion, sore throat, HA and fever as associated symptoms. She has no h/o chronic medical conditions. She denies smoking and alcohol use.   History reviewed. No pertinent past medical history.  History reviewed. No pertinent past surgical history.  History reviewed. No pertinent family history.  History  Substance Use Topics  . Smoking status: Never Smoker   . Smokeless tobacco: Not on file  . Alcohol Use: No     Review of Systems  A complete 10 system review of systems was obtained and all systems are negative except as noted in the HPI and PMH.   Allergies  Review of patient's allergies indicates no known allergies.  Home Medications   Current Outpatient Rx  Name Route Sig Dispense Refill  . IBUPROFEN 800 MG PO TABS Oral Take 800 mg by mouth every 8 (eight) hours as needed. For pain.    Marland Kitchen AMOXICILLIN 500 MG PO CAPS Oral Take 1 capsule (500 mg total) by mouth 3 (three) times daily. 21 capsule 0    Triage Vitals: BP 124/65  Pulse 88  Temp(Src) 98.1 F (36.7 C) (Oral)  Resp 16  SpO2 99%  Physical Exam  Nursing note and vitals  reviewed. Constitutional: She is oriented to person, place, and time. She appears well-developed and well-nourished.  HENT:  Head: Normocephalic and atraumatic.  Right Ear: External ear normal.  Left Ear: External ear normal.       Left TM is normal, Right TM is discolored, thickened and opaque consistent with otitis media, no clear evidence for external auditory canal foreign body or cerumen impaction  Eyes: Conjunctivae and EOM are normal. Pupils are equal, round, and reactive to light.  Neck: Normal range of motion and phonation normal. Neck supple.  Cardiovascular: Normal rate, regular rhythm, normal heart sounds and intact distal pulses.   Pulmonary/Chest: Effort normal and breath sounds normal. She exhibits no tenderness and no bony tenderness.  Abdominal: Soft. Normal appearance. She exhibits no distension. There is no tenderness. There is no guarding.  Musculoskeletal: Normal range of motion.  Neurological: She is alert and oriented to person, place, and time. She has normal strength. No cranial nerve deficit or sensory deficit. She exhibits normal muscle tone. Coordination normal.  Skin: Skin is warm, dry and intact.  Psychiatric: She has a normal mood and affect. Her behavior is normal. Judgment and thought content normal.    ED Course  Procedures (including critical care time)  DIAGNOSTIC STUDIES: Oxygen Saturation is 99% on room air, normal by my interpretation.    COORDINATION OF CARE:  1:58PM-Discussed antibiotic and Afrin spray as discharge plan with pt and pt agreed to plan. Advised pt to follow up with an ENT if the hearing problem has not resolved by next week.   Labs Reviewed - No data to display No results found.   1. Otitis media       MDM  Hearing loss, right ear without evidence for auditory obstruction. She most likely has middle ear infection. Doubt sinusitis, or serious bacterial illness. She is stable for discharge.      I personally performed the  services described in this documentation, which was scribed in my presence. The recorded iamoxicillinnformation has been reviewed and considered.     Plan: Home Medications-     Amoxacillin; Home Treatments- rest; Recommended follow up- ENT if not better in 4 days   Maria Melter, MD 04/04/12 2032

## 2012-04-12 IMAGING — CT CT ABD-PELV W/O CM
1 of 2 series · 15 of 32 positions shown, 19 images · non-contrast
Comparison: 03/05/2011

CLINICAL DATA: Right flank pain.  Right lower quadrant pain.
Nausea.

CT ABDOMEN AND PELVIS WITHOUT CONTRAST
TECHNIQUE: Multidetector CT imaging of the abdomen and pelvis was
performed following the standard protocol without intravenous
contrast.

[Series 2: stone under 200# w/ prev · axial · 0.65mm/px · z∈[-468,-42]mm · 15 of 93 slices shown, 19 images]
[im 4/93  soft-tissue]
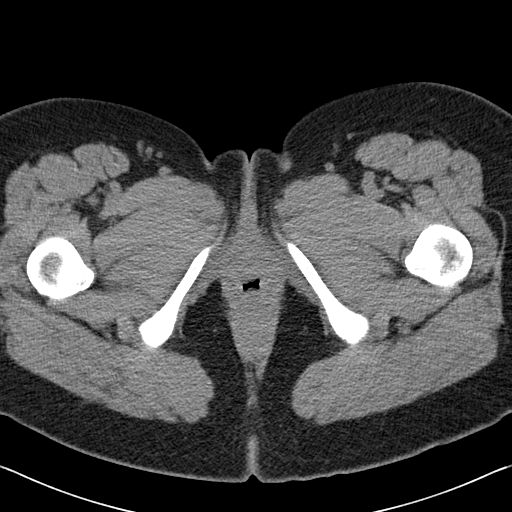
[im 4/93  bone]
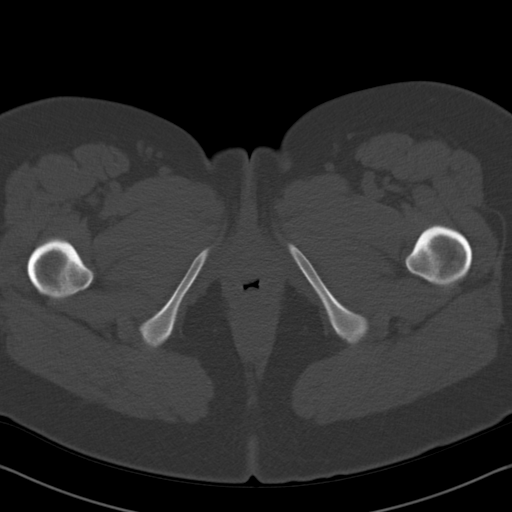
[im 12/93  soft-tissue]
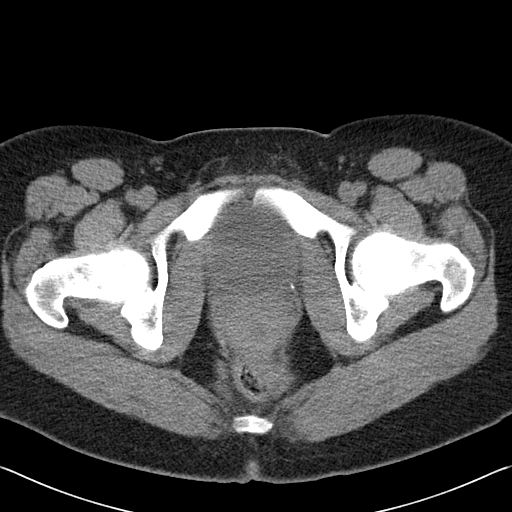
[im 20/93  soft-tissue]
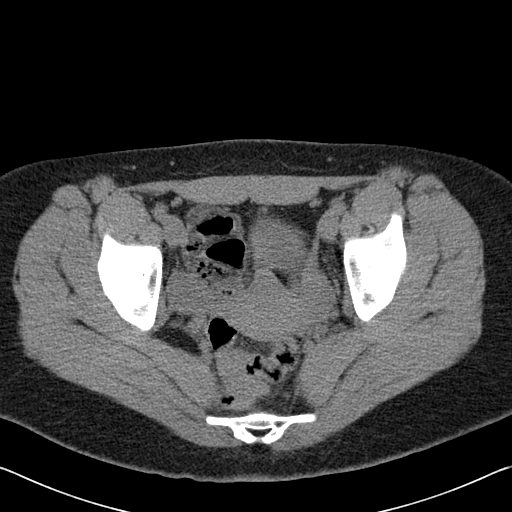
[im 27/93  soft-tissue]
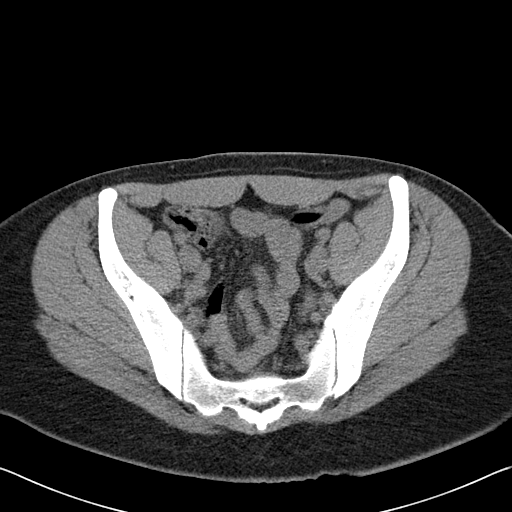
[im 31/93  soft-tissue]
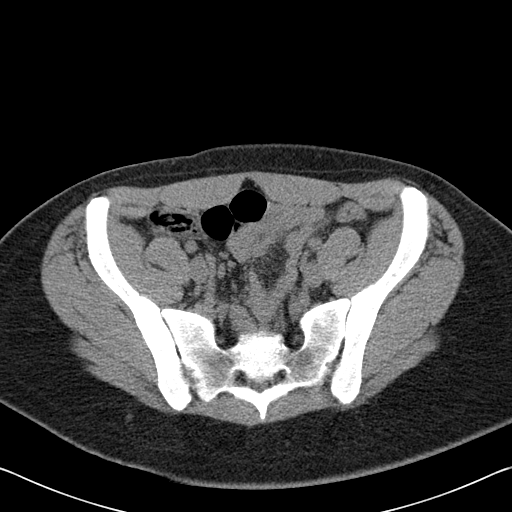
[im 39/93  soft-tissue]
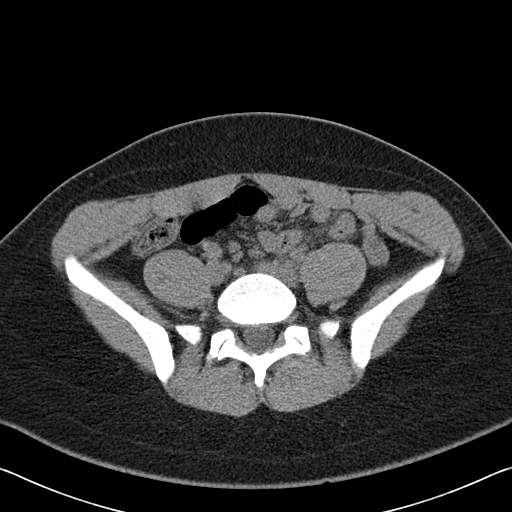
[im 47/93  soft-tissue]
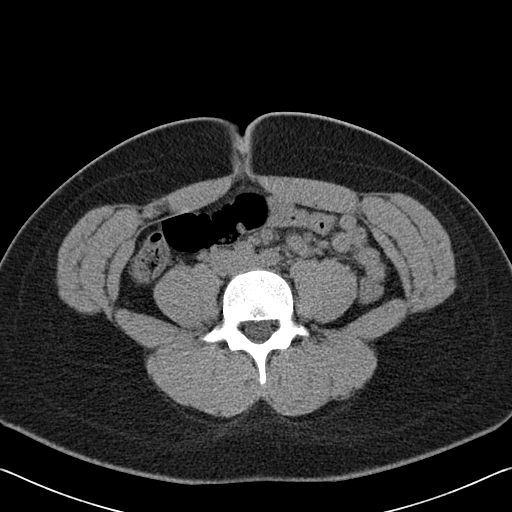
[im 54/93  soft-tissue]
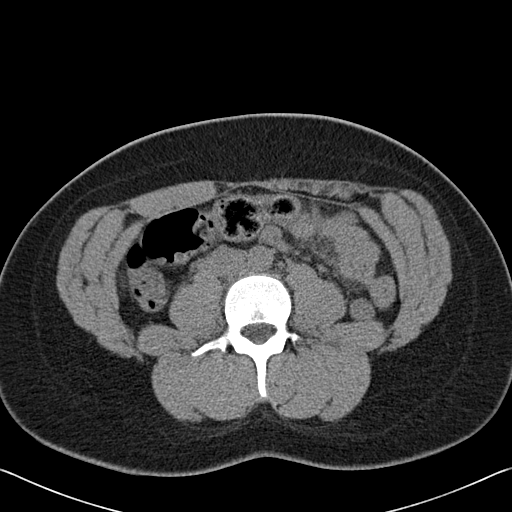
[im 62/93  soft-tissue]
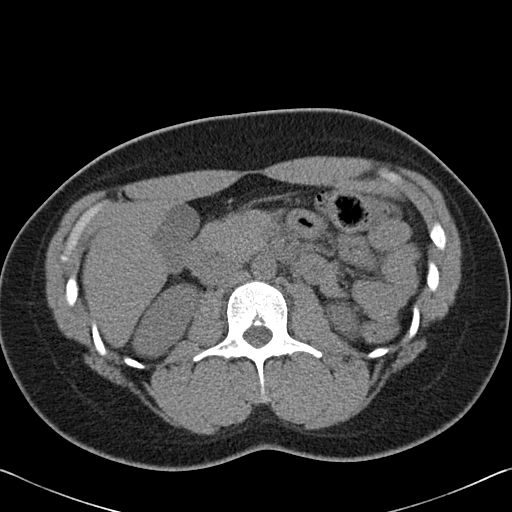
[im 62/93  bone]
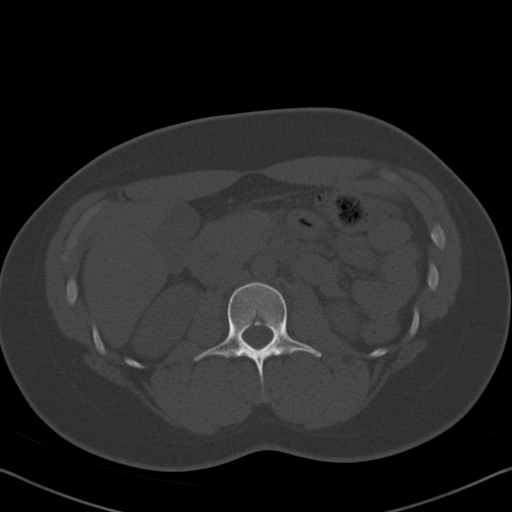
[im 66/93  soft-tissue]
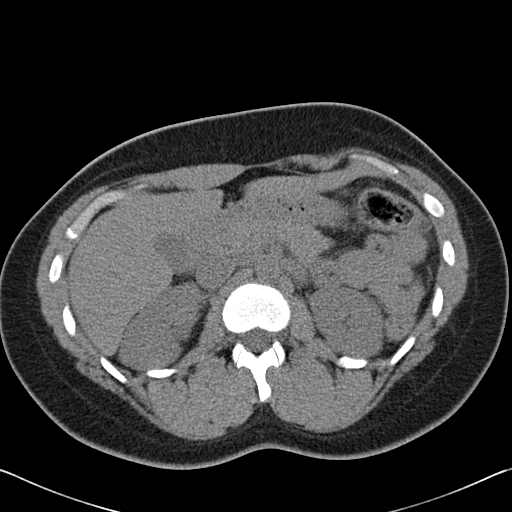
[im 73/93  soft-tissue]
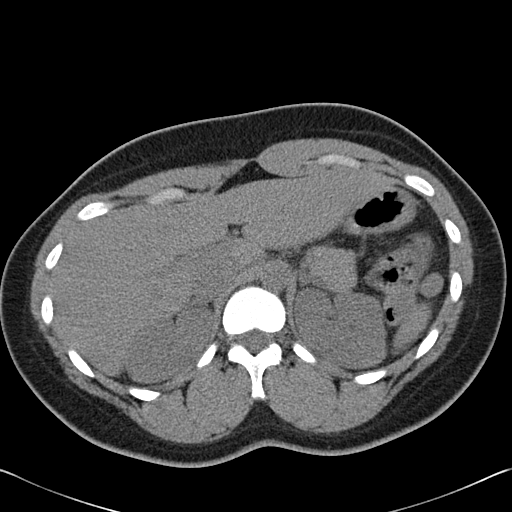
[im 77/93  lung]
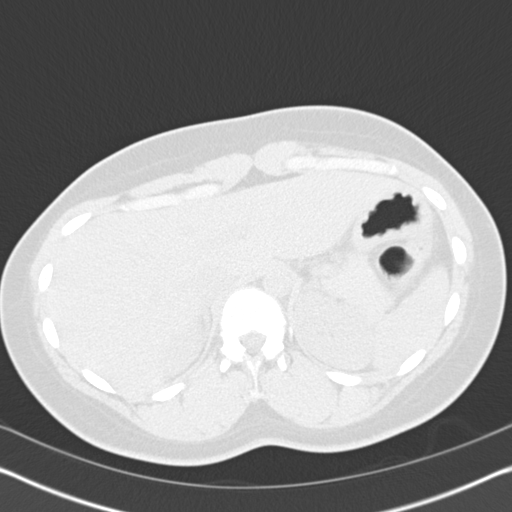
[im 81/93  soft-tissue]
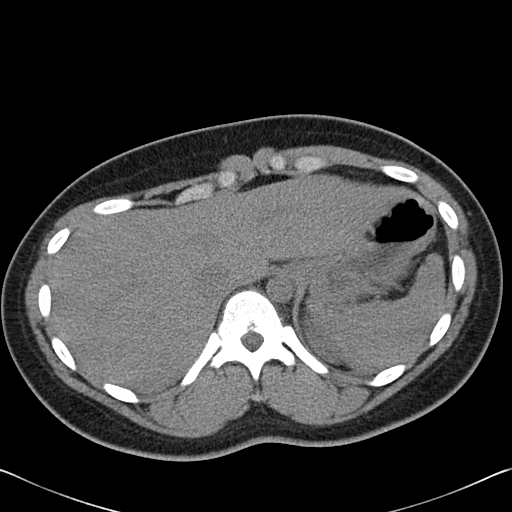
[im 81/93  lung]
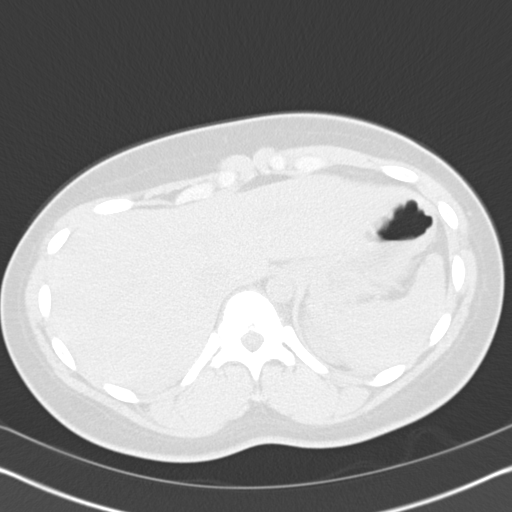
[im 85/93  lung]
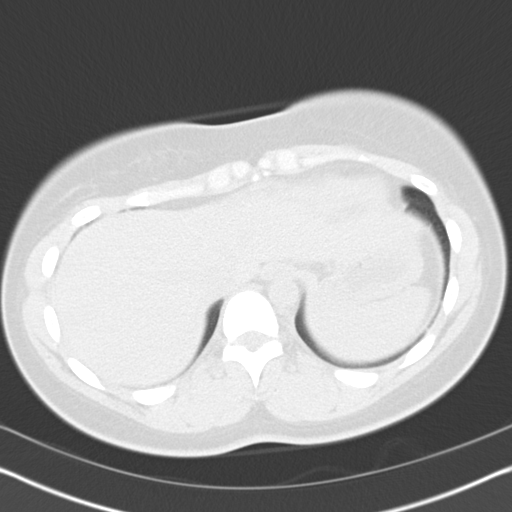
[im 89/93  soft-tissue]
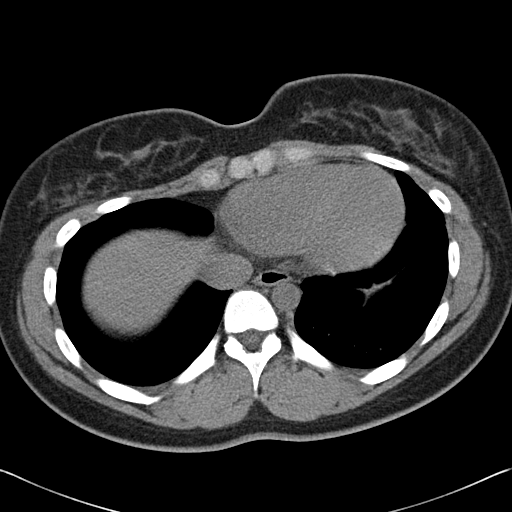
[im 89/93  lung]
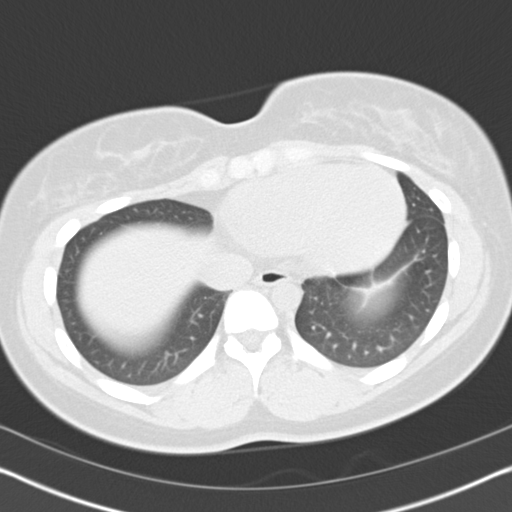

[15 of 32 positions shown; findings below may reference images not displayed]

FINDINGS: The visualized portion of the liver, spleen, pancreas,
and adrenal glands appear unremarkable in noncontrast CT
appearance.

The gallbladder and biliary system appear unremarkable.

Several left pelvic phleboliths appears stable.

No dilated bowel noted.

Segments of the appendix are well visualized, for example on image
43 of series 602, and do not appear thickened.  Some portions of
the appendix are obscured by adjacent bowel and ovarian structures.

No wall thickening in the terminal ileum is demonstrated.

Urinary bladder appears unremarkable. No ascites noted.  Urinary
bladder, uterus, and adnexal contours appear unremarkable.
IMPRESSION: 1.  Negative exam; no change from 03/05/2011 or prior CT scans.

## 2012-04-13 IMAGING — US US PELVIS COMPLETE
1 series · 14 of 25 positions shown · non-contrast
Comparison: None.

CLINICAL DATA: Right lower quadrant pain.  Rule out ovarian cyst



[Series 1: us pelvis complete · 0.33mm/px · 14 of 52 slices shown]
[im 1/52]
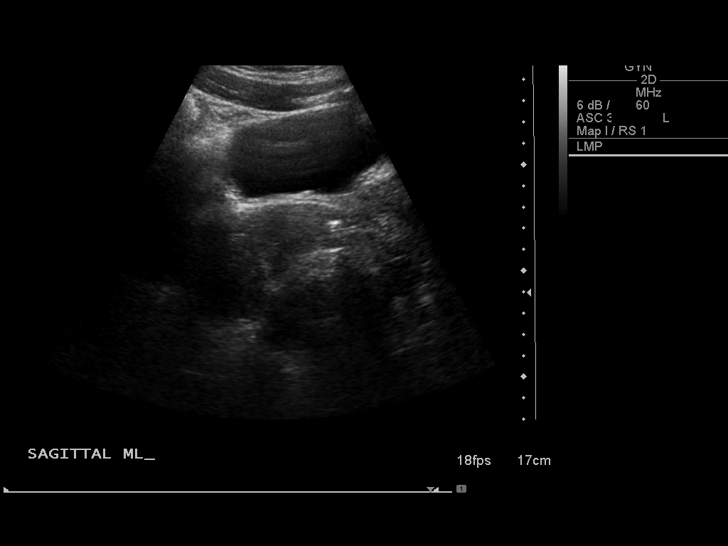
[im 5/52]
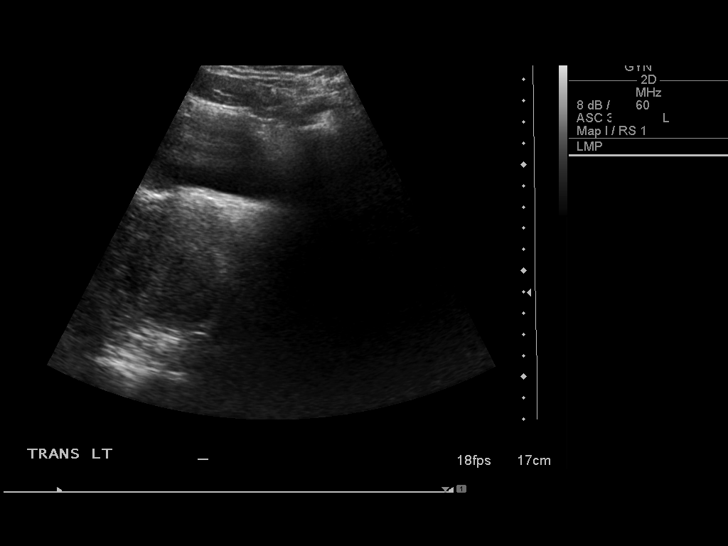
[im 9/52]
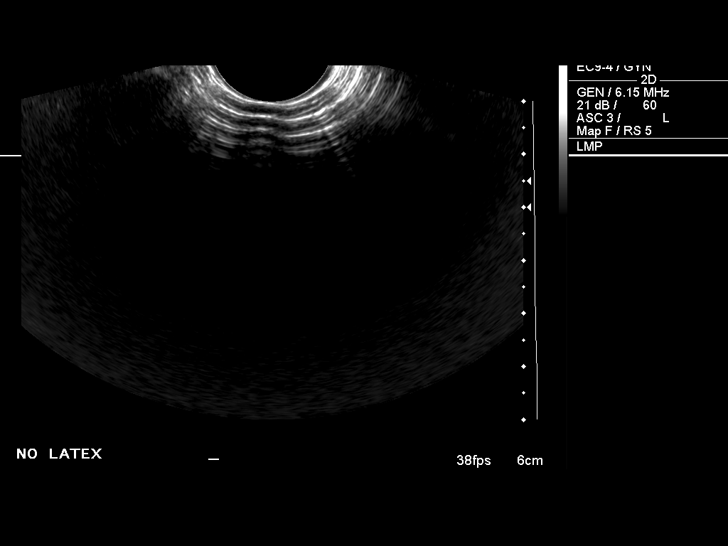
[im 13/52]
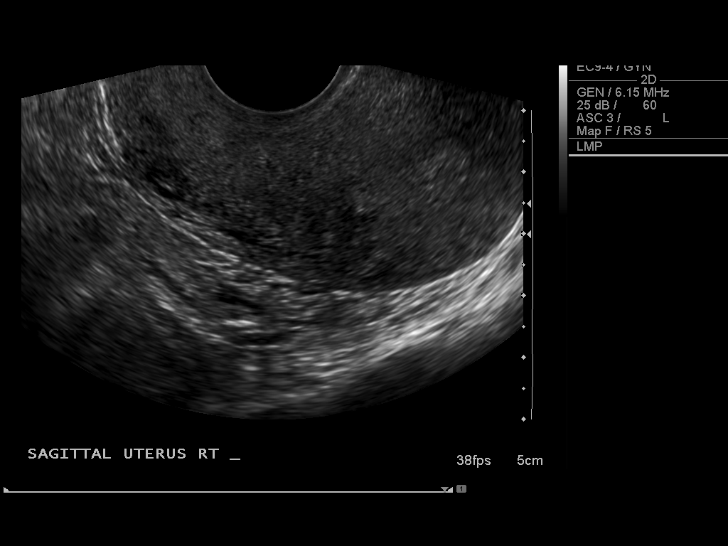
[im 18/52]
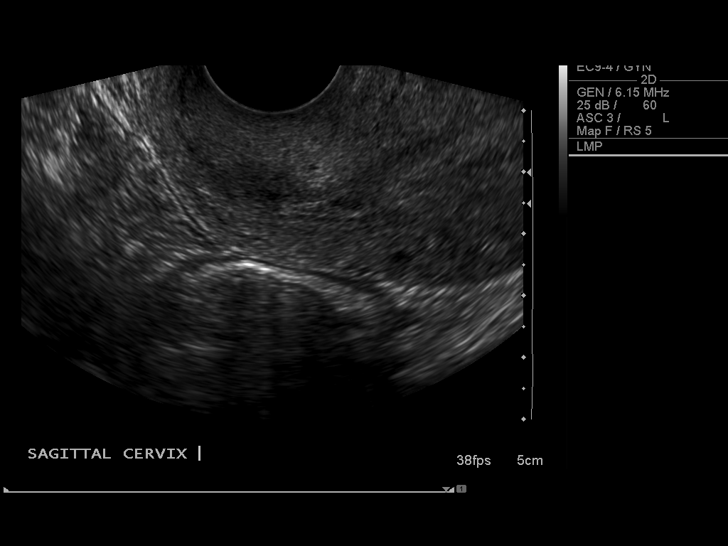
[im 20/52]
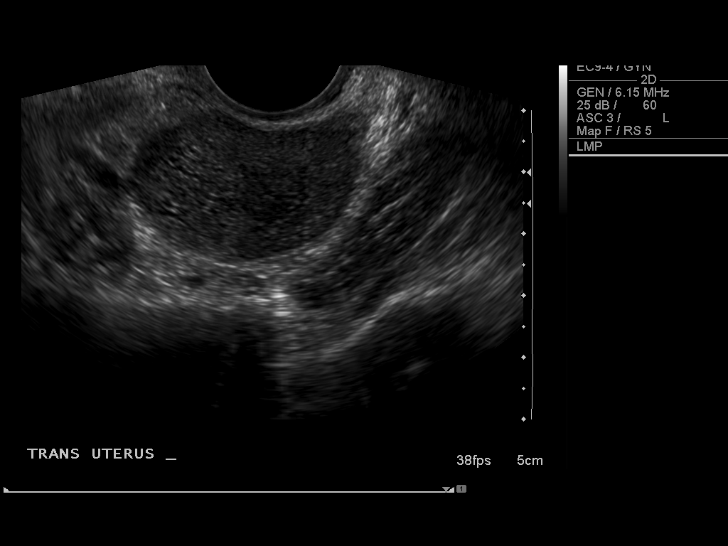
[im 24/52]
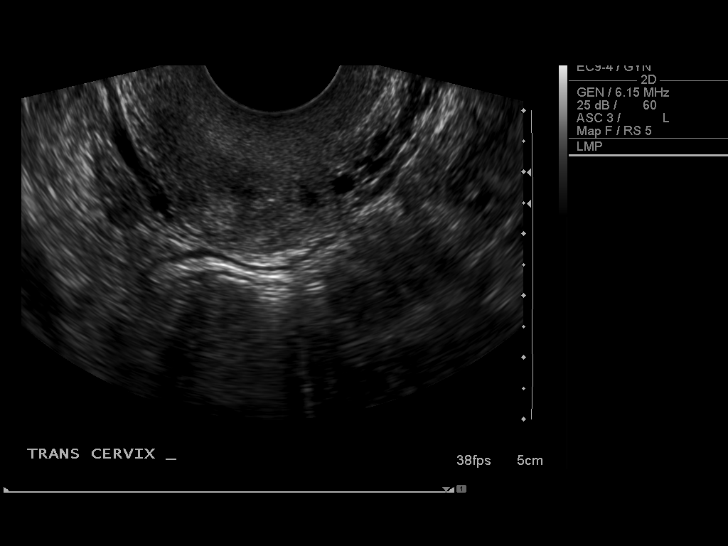
[im 28/52]
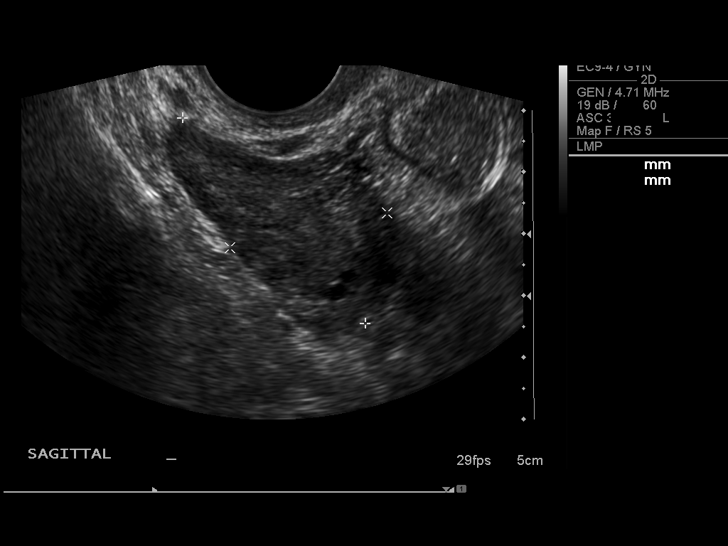
[im 32/52]
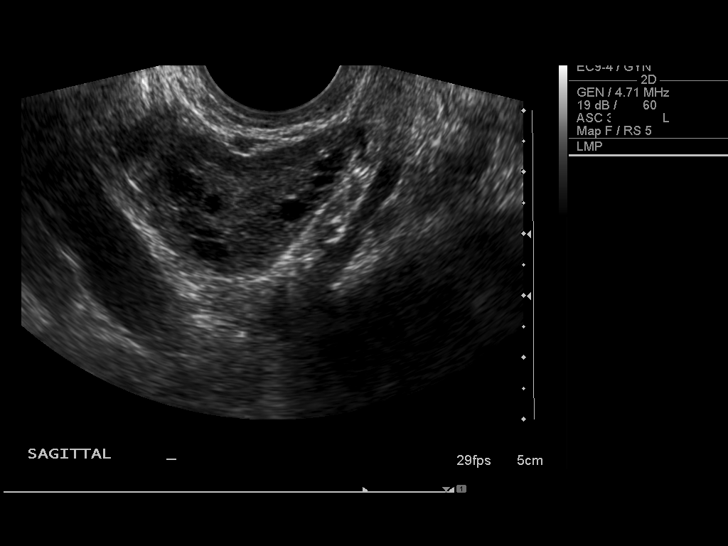
[im 35/52]
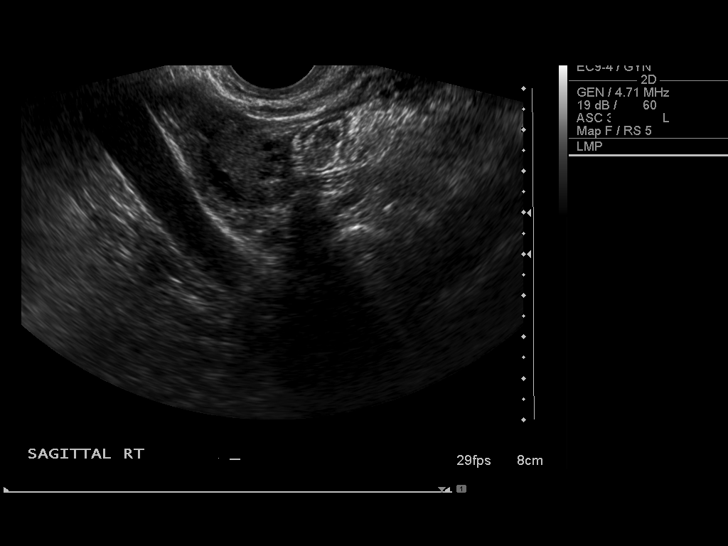
[im 39/52]
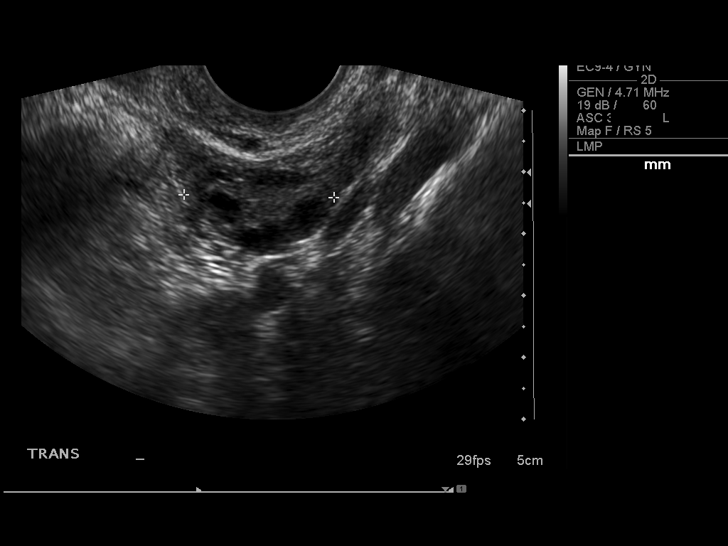
[im 43/52]
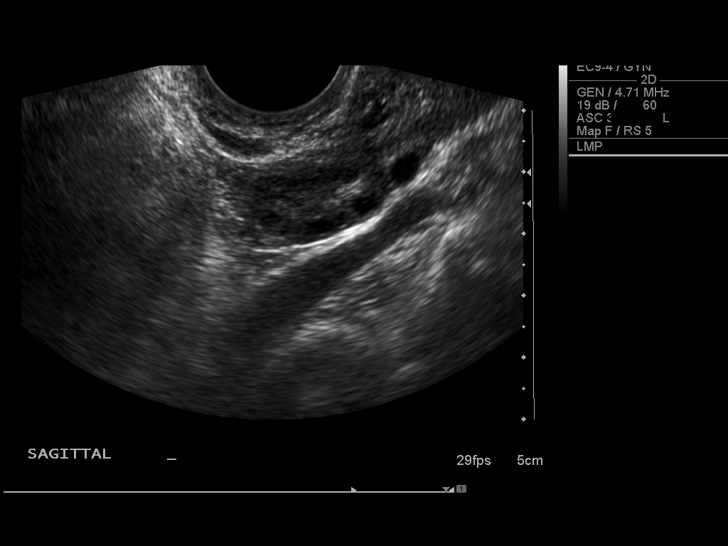
[im 47/52]
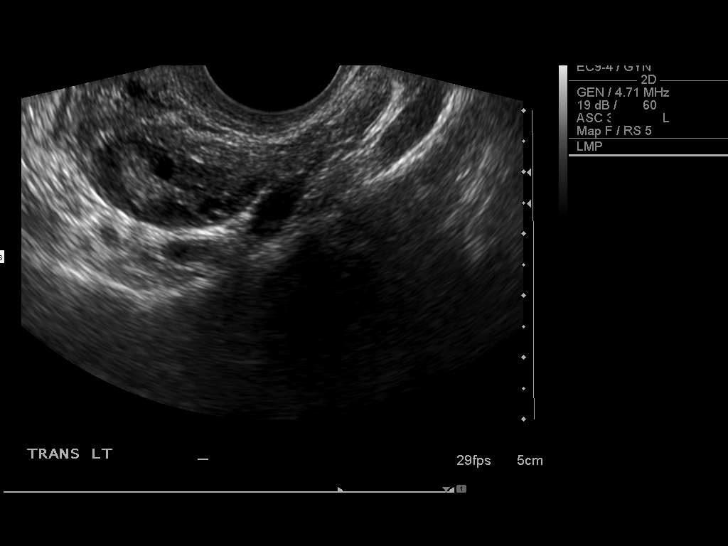
[im 52/52]
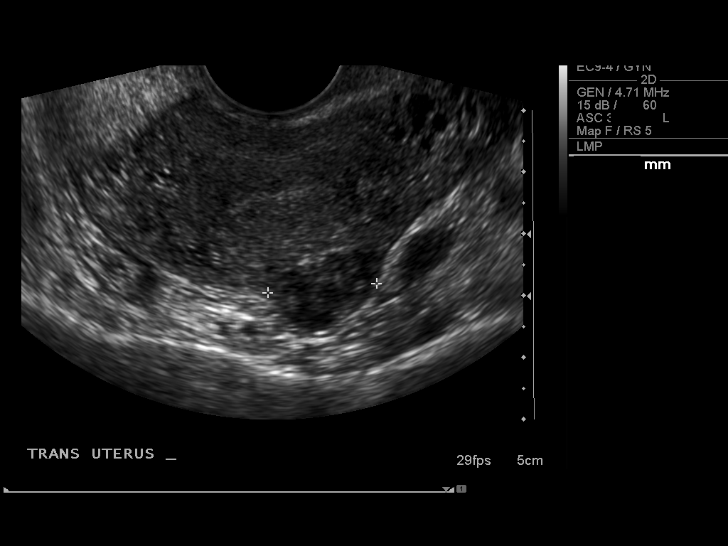

[14 of 25 positions shown; findings below may reference images not displayed]

FINDINGS: Uterus:  7.1 x 3.7 x 5.4 cm.  Retroverted uterus.  Posterior
fibroid measures 1.5 x 1.8 cm.

Endometrium:  7.4 mm and homogeneous.

Right ovary:  4.5 x 2.6 x 2.8 cm.  Normal appearance/no adnexal
mass.

Left ovary:  3.0 x 1.3 x 2.4 cm.  Normal appearance/no adnexal
mass.

Other findings:  No free fluid.
IMPRESSION: 1.5 cm uterine fibroid.  Otherwise negative.  No ovarian cyst or
free fluid.

## 2012-08-08 ENCOUNTER — Emergency Department (HOSPITAL_COMMUNITY): Payer: Self-pay

## 2012-08-08 ENCOUNTER — Emergency Department (HOSPITAL_COMMUNITY)
Admission: EM | Admit: 2012-08-08 | Discharge: 2012-08-08 | Disposition: A | Discharge status: Home / Self Care | Payer: Self-pay | Attending: Emergency Medicine | Admitting: Emergency Medicine

## 2012-08-08 ENCOUNTER — Encounter (HOSPITAL_COMMUNITY): Payer: Self-pay | Admitting: *Deleted

## 2012-08-08 DIAGNOSIS — S60221A Contusion of right hand, initial encounter: Secondary | ICD-10-CM

## 2012-08-08 DIAGNOSIS — S60229A Contusion of unspecified hand, initial encounter: Secondary | ICD-10-CM | POA: Insufficient documentation

## 2012-08-08 DIAGNOSIS — W2209XA Striking against other stationary object, initial encounter: Secondary | ICD-10-CM | POA: Insufficient documentation

## 2012-08-08 MED ORDER — IBUPROFEN 800 MG PO TABS
800.0000 mg | ORAL_TABLET | Freq: Once | ORAL | Status: AC
  Administered 2012-08-08: 800 mg via ORAL
  Filled 2012-08-08: qty 1

## 2012-08-08 MED ORDER — IBUPROFEN 800 MG PO TABS: 800.0000 mg | ORAL_TABLET | Freq: Three times a day (TID) | ORAL | Status: DC

## 2012-08-08 MED ORDER — TRAMADOL HCL 50 MG PO TABS: 50.0000 mg | ORAL_TABLET | Freq: Four times a day (QID) | ORAL | Status: DC | PRN

## 2012-08-08 NOTE — ED Notes (Signed)
Patient transported to X-ray 

## 2012-08-08 NOTE — Progress Notes (Signed)
Orthopedic Tech Progress Note Patient Details:  Maria Sampson November 19, 1991 [id number]  Ortho Devices Type of Ortho Device: Sling immobilizer   Haskell Flirt 08/08/2012, 4:15 AM
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
## 2012-08-08 NOTE — ED Notes (Signed)
Patient returned from X-ray 

## 2012-11-01 ENCOUNTER — Emergency Department (HOSPITAL_COMMUNITY)
Admission: EM | Admit: 2012-11-01 | Discharge: 2012-11-01 | Disposition: A | Discharge status: Home / Self Care | Payer: Self-pay | Attending: Emergency Medicine | Admitting: Emergency Medicine

## 2012-11-01 DIAGNOSIS — Y929 Unspecified place or not applicable: Secondary | ICD-10-CM | POA: Insufficient documentation

## 2012-11-01 DIAGNOSIS — S99929A Unspecified injury of unspecified foot, initial encounter: Secondary | ICD-10-CM | POA: Insufficient documentation

## 2012-11-01 DIAGNOSIS — W108XXA Fall (on) (from) other stairs and steps, initial encounter: Secondary | ICD-10-CM | POA: Insufficient documentation

## 2012-11-01 DIAGNOSIS — S8990XA Unspecified injury of unspecified lower leg, initial encounter: Secondary | ICD-10-CM | POA: Insufficient documentation

## 2012-11-01 DIAGNOSIS — S86009A Unspecified injury of unspecified Achilles tendon, initial encounter: Secondary | ICD-10-CM

## 2012-11-01 DIAGNOSIS — Y939 Activity, unspecified: Secondary | ICD-10-CM | POA: Insufficient documentation

## 2012-11-01 DIAGNOSIS — R269 Unspecified abnormalities of gait and mobility: Secondary | ICD-10-CM | POA: Insufficient documentation

## 2012-11-01 MED ORDER — NAPROXEN 500 MG PO TABS: 500.0000 mg | ORAL_TABLET | Freq: Two times a day (BID) | ORAL | Status: DC

## 2012-11-01 NOTE — ED Provider Notes (Signed)
History     CSN: [id number]  Arrival date & time 11/01/12  2033   First MD Initiated Contact with Patient 11/01/12 2047      No chief complaint on file.   (Consider location/radiation/quality/duration/timing/severity/associated sxs/prior treatment) The history is provided by the patient and medical records.    Maria Sampson is a 21 y.o. female  with Hx of achiles tendon injury in march 2013 presents to the Emergency Department complaining of acute, persistent, progressively worsening achilles tendon pain onset yesterday afternoon. Pt was helping move when he foot slipped off the top stair and landed on the second stair, scraping her achilles tendon and the back of her calf on the edge of the stair.  Associated symptoms include pain with walking, pain to palpation, swelling of the posterior ankle, decreased ROM.  Nothing makes it better and nothing makes it worse.  Pt denies fever, chills headache, neck pain, back pain, numbness, tingling, loss of sensation, inability to walk.     No past medical history on file.  No past surgical history on file.  No family history on file.  History  Substance Use Topics  . Smoking status: Never Smoker   . Smokeless tobacco: Not on file  . Alcohol Use: Yes     Comment: occasionally    OB History    Grav Para Term Preterm Abortions TAB SAB Ect Mult Living                  Review of Systems  Musculoskeletal: Positive for joint swelling, arthralgias and gait problem (2/2 pain).  Skin: Negative for wound.  Neurological: Negative for numbness.  All other systems reviewed and are negative.    Allergies  Review of patient's allergies indicates no known allergies.  Home Medications   Current Outpatient Rx  Name  Route  Sig  Dispense  Refill  . NAPROXEN 500 MG PO TABS   Oral   Take 1 tablet (500 mg total) by mouth 2 (two) times daily with a meal.   30 tablet   0     BP 143/91  Pulse 87  Temp 99 F (37.2 C) (Oral)  Resp 16   SpO2 100%  Physical Exam  Nursing note and vitals reviewed. Constitutional: She appears well-developed and well-nourished. No distress.  HENT:  Head: Normocephalic and atraumatic.  Eyes: Conjunctivae normal are normal.  Cardiovascular: Normal rate, regular rhythm, normal heart sounds and intact distal pulses.  Exam reveals no gallop and no friction rub.   No murmur heard.      Capillary refill < 3 seconds  Pulmonary/Chest: Effort normal and breath sounds normal. No respiratory distress. She has no wheezes. She has no rales.  Musculoskeletal: She exhibits tenderness. She exhibits no edema.       Right foot: She exhibits decreased range of motion and tenderness. She exhibits no bony tenderness, no swelling, normal capillary refill, no crepitus, no deformity and no laceration.       Feet:       ROM: decreased ROM of the R ankle 2/2 pain Thompson's test negative  Neurological: She is alert. Coordination normal.       Sensation intact to dull and sharp bilaterally Strength decreased 2/2 pain with dorsiflexion weaker than plantarflexion  Skin: Skin is warm and dry. She is not diaphoretic.    ED Course  Procedures (including critical care time)  Labs Reviewed - No data to display No results found.   1. Achilles tendon injury  MDM  Maria Sampson presents with injury to her Achilles tendon.  Patient with history of injury to the same Achilles tendon without followup to orthopedics.  Constant test negative and I do not suspect an Achilles tendon rupture. I do not suspect fracture to the ankle or foot, therefore we will not obtain images.  Patient is able to ambulate with some difficulty secondary to pain.  I will place in a CAM walker, recommended conservative therapies and follow-up with ortho.  Pt states understanding.    1. Medications: naprosyn, usual home medications 2. Treatment: rest, drink plenty of fluids, Rest, Ice, Compress, Elevate 3. Follow Up: Please followup  with your primary doctor for discussion of your diagnoses and further evaluation after today's visit; if you do not have a primary care doctor use the resource guide provided to find one; follow-up with orthopedics.             Dahlia Client Cabot Cromartie, PA-C 11/01/12 2130

## 2012-11-02 NOTE — ED Provider Notes (Signed)
Medical screening examination/treatment/procedure(s) were performed by non-physician practitioner and as supervising physician I was immediately available for consultation/collaboration.  Doyce Stonehouse T Seymore Brodowski, MD 11/02/12 1518 

## 2012-11-03 ENCOUNTER — Emergency Department (HOSPITAL_COMMUNITY)
Admission: EM | Admit: 2012-11-03 | Discharge: 2012-11-03 | Disposition: A | Discharge status: Home / Self Care | Payer: Self-pay | Attending: Emergency Medicine | Admitting: Emergency Medicine

## 2012-11-03 ENCOUNTER — Encounter (HOSPITAL_COMMUNITY): Payer: Self-pay | Admitting: Emergency Medicine

## 2012-11-03 DIAGNOSIS — Z888 Allergy status to other drugs, medicaments and biological substances status: Secondary | ICD-10-CM | POA: Insufficient documentation

## 2012-11-03 DIAGNOSIS — Z789 Other specified health status: Secondary | ICD-10-CM

## 2012-11-03 DIAGNOSIS — R42 Dizziness and giddiness: Secondary | ICD-10-CM | POA: Insufficient documentation

## 2012-11-03 DIAGNOSIS — I10 Essential (primary) hypertension: Secondary | ICD-10-CM | POA: Insufficient documentation

## 2012-11-03 DIAGNOSIS — M25579 Pain in unspecified ankle and joints of unspecified foot: Secondary | ICD-10-CM | POA: Insufficient documentation

## 2012-11-03 DIAGNOSIS — H538 Other visual disturbances: Secondary | ICD-10-CM | POA: Insufficient documentation

## 2012-11-03 MED ORDER — OXYCODONE-ACETAMINOPHEN 5-325 MG PO TABS
1.0000 | ORAL_TABLET | Freq: Once | ORAL | Status: AC
  Administered 2012-11-03: 1 via ORAL
  Filled 2012-11-03: qty 1

## 2012-11-03 MED ORDER — OXYCODONE-ACETAMINOPHEN 5-325 MG PO TABS: 1.0000 | ORAL_TABLET | ORAL | Status: DC | PRN

## 2012-11-03 NOTE — ED Provider Notes (Signed)
History  This chart was scribed for Maria Sampson, a non-physician practitioner, working with Raeford Razor, MD by Bennett Scrape, ED Scribe. This patient was seen in room WTR6/WTR6 and the patient's care was started at 7:27 PM.  CSN: [id number]  Arrival date & time 11/03/12  4098   First MD Initiated Contact with Patient 11/03/12 1927      Chief Complaint  Patient presents with  . Medication Dose Change     The history is provided by the patient. No language interpreter was used.    Maria Sampson is a 21 y.o. female who presents to the Emergency Department requesting a different pain medication. She reports that she was seen in the ED 2 days ago for an achilles injury and states that she received naproxen which has been making her dizzy and having blurred visual disturbances. Her last dose was yesterday. She states that she has also tried ibuprofen with no improvement. She denies having blurred visual disturbances and dizziness currently.The right achilles pain is constant and described as generally throbbing with worse stabbing pains occasionally. She rates her pain a 9 out of 10 currently even with the ortho boot.  She reports that she called the ortho referral and was told to call back in 2 days. The original injury occurred in May 2013 and the pain became worse 4 days ago. She denies any new injury or worsening pain since the last visit. She does not have a h/o chronic medical conditions and is an occasional alcohol user but denies smoking.   History reviewed. No pertinent past medical history.  History reviewed. No pertinent past surgical history.  No family history on file.  History  Substance Use Topics  . Smoking status: Never Smoker   . Smokeless tobacco: Not on file  . Alcohol Use: Yes     Comment: occasionally    No OB history provided.  Review of Systems  Constitutional: Negative for fever and chills.  Eyes: Positive for visual disturbance.  Gastrointestinal:  Negative for nausea, vomiting and abdominal pain.  Musculoskeletal: Negative for back pain.       Positive for right achilles pain  Skin: Negative for rash.  Neurological: Positive for dizziness. Negative for headaches.  All other systems reviewed and are negative.    Allergies  Review of patient's allergies indicates no known allergies.  Home Medications   Current Outpatient Rx  Name  Route  Sig  Dispense  Refill  . NAPROXEN 500 MG PO TABS   Oral   Take 1 tablet (500 mg total) by mouth 2 (two) times daily with a meal.   30 tablet   0     Triage Vitals: BP 140/77  Pulse 76  Temp 98.6 F (37 C) (Oral)  Resp 18  SpO2 100%  LMP 10/19/2012  Physical Exam  Nursing note and vitals reviewed. Constitutional: She is oriented to person, place, and time. She appears well-developed and well-nourished. No distress.  HENT:  Head: Normocephalic and atraumatic.  Eyes: EOM are normal.  Neck: Neck supple. No tracheal deviation present.  Cardiovascular: Normal rate.   Pulmonary/Chest: Effort normal. No respiratory distress.  Musculoskeletal:       Pain is in the posterior achilles of the right ankle, PT is 2+, positive CSM below injury  Neurological: She is alert and oriented to person, place, and time.  Skin: Skin is warm and dry.  Psychiatric: She has a normal mood and affect. Her behavior is normal.    ED  Course  Procedures (including critical care time)  DIAGNOSTIC STUDIES: Oxygen Saturation is 100% on room air, normal by my interpretation.    COORDINATION OF CARE: 7:40 PM-Discussed discharge plan which includes narcotic pain medication and ibuprofen with pt and pt agreed to plan. Also advised pt to follow up with ortho and pt agreed.   Labs Reviewed - No data to display No results found.   No diagnosis found.    MDM  Pain medication adjustment.  Unable to take naproxen due to dizziness and blurred vision when she took it yesterday.  No symptoms today.  Doubt  naproxyn is causing these symptoms.  Will give rx for ibuprofen and a few percocet.  Follow up on Wednesday with orthopedics.       I personally performed the services described in this documentation, which was scribed in my presence. The recorded information has been reviewed and is accurate.   Maria Haggard, NP 11/03/12 1948

## 2012-11-03 NOTE — ED Notes (Signed)
Pt states she has a ride home. 

## 2012-11-03 NOTE — ED Notes (Signed)
Pt ambulatory to exam room. Pt states naproxyn makes her dizzy and "messes with my vision." Pt states pain is not helped with naproxyn. Pt requesting stronger pain meds.Pt arrives with CAM boot to R foot.

## 2012-11-03 NOTE — ED Notes (Signed)
Pt presenting to ed with c/o seen here x 2 days ago for an achilles injury and she received naproxen for pain and it's making her dizzy pt requesting new pain medication.

## 2012-11-05 NOTE — ED Provider Notes (Signed)
Medical screening examination/treatment/procedure(s) were performed by non-physician practitioner and as supervising physician I was immediately available for consultation/collaboration.  Olesya Wike, MD 11/05/12 0025 

## 2012-11-25 ENCOUNTER — Emergency Department (HOSPITAL_COMMUNITY): Payer: Self-pay

## 2012-11-25 ENCOUNTER — Emergency Department (HOSPITAL_COMMUNITY)
Admission: EM | Admit: 2012-11-25 | Discharge: 2012-11-25 | Disposition: A | Discharge status: Home / Self Care | Payer: Self-pay | Attending: Emergency Medicine | Admitting: Emergency Medicine

## 2012-11-25 ENCOUNTER — Encounter (HOSPITAL_COMMUNITY): Payer: Self-pay

## 2012-11-25 DIAGNOSIS — Z3202 Encounter for pregnancy test, result negative: Secondary | ICD-10-CM | POA: Insufficient documentation

## 2012-11-25 DIAGNOSIS — R630 Anorexia: Secondary | ICD-10-CM | POA: Insufficient documentation

## 2012-11-25 DIAGNOSIS — R112 Nausea with vomiting, unspecified: Secondary | ICD-10-CM | POA: Insufficient documentation

## 2012-11-25 DIAGNOSIS — R1031 Right lower quadrant pain: Secondary | ICD-10-CM | POA: Insufficient documentation

## 2012-11-25 DIAGNOSIS — N760 Acute vaginitis: Secondary | ICD-10-CM | POA: Insufficient documentation

## 2012-11-25 LAB — CBC WITH DIFFERENTIAL/PLATELET
Basophils Absolute: 0 10*3/uL (ref 0.0–0.1)
Basophils Relative: 0 % (ref 0–1)
Eosinophils Absolute: 0.2 10*3/uL (ref 0.0–0.7)
HCT: 38.8 % (ref 36.0–46.0)
MCH: 30.7 pg (ref 26.0–34.0)
MCHC: 34.8 g/dL (ref 30.0–36.0)
Monocytes Absolute: 0.6 10*3/uL (ref 0.1–1.0)
Neutro Abs: 6 10*3/uL (ref 1.7–7.7)
RDW: 12.6 % (ref 11.5–15.5)

## 2012-11-25 LAB — URINALYSIS, ROUTINE W REFLEX MICROSCOPIC
Glucose, UA: NEGATIVE mg/dL
Specific Gravity, Urine: 1.023 (ref 1.005–1.030)
pH: 5.5 (ref 5.0–8.0)

## 2012-11-25 LAB — PREGNANCY, URINE: Preg Test, Ur: NEGATIVE

## 2012-11-25 LAB — COMPREHENSIVE METABOLIC PANEL
AST: 19 U/L (ref 0–37)
Albumin: 4.1 g/dL (ref 3.5–5.2)
Calcium: 9.3 mg/dL (ref 8.4–10.5)
Chloride: 98 mEq/L (ref 96–112)
Creatinine, Ser: 0.93 mg/dL (ref 0.50–1.10)
Total Bilirubin: 0.3 mg/dL (ref 0.3–1.2)
Total Protein: 7.2 g/dL (ref 6.0–8.3)

## 2012-11-25 LAB — URINE MICROSCOPIC-ADD ON

## 2012-11-25 MED ORDER — SODIUM CHLORIDE 0.9 % IV SOLN
1000.0000 mL | INTRAVENOUS | Status: DC
  Administered 2012-11-25: 1000 mL via INTRAVENOUS

## 2012-11-25 MED ORDER — TRAMADOL HCL 50 MG PO TABS: 100.0000 mg | ORAL_TABLET | Freq: Four times a day (QID) | ORAL | Status: DC | PRN

## 2012-11-25 MED ORDER — TRAMADOL HCL 50 MG PO TABS
100.0000 mg | ORAL_TABLET | Freq: Once | ORAL | Status: AC
  Administered 2012-11-25: 100 mg via ORAL
  Filled 2012-11-25: qty 2

## 2012-11-25 MED ORDER — MORPHINE SULFATE 4 MG/ML IJ SOLN
4.0000 mg | Freq: Once | INTRAMUSCULAR | Status: AC
  Administered 2012-11-25: 4 mg via INTRAVENOUS
  Filled 2012-11-25: qty 1

## 2012-11-25 MED ORDER — SODIUM CHLORIDE 0.9 % IV SOLN
1000.0000 mL | Freq: Once | INTRAVENOUS | Status: AC
  Administered 2012-11-25: 1000 mL via INTRAVENOUS

## 2012-11-25 MED ORDER — METRONIDAZOLE 500 MG PO TABS: 500.0000 mg | ORAL_TABLET | Freq: Two times a day (BID) | ORAL | Status: DC

## 2012-11-25 MED ORDER — IOHEXOL 300 MG/ML  SOLN
100.0000 mL | Freq: Once | INTRAMUSCULAR | Status: AC | PRN
  Administered 2012-11-25: 100 mL via INTRAVENOUS

## 2012-11-25 MED ORDER — ONDANSETRON HCL 4 MG/2ML IJ SOLN
4.0000 mg | Freq: Once | INTRAMUSCULAR | Status: AC
  Administered 2012-11-25: 4 mg via INTRAVENOUS
  Filled 2012-11-25: qty 2

## 2012-11-25 MED ORDER — KETOROLAC TROMETHAMINE 30 MG/ML IJ SOLN
30.0000 mg | Freq: Once | INTRAMUSCULAR | Status: AC
  Administered 2012-11-25: 30 mg via INTRAVENOUS
  Filled 2012-11-25: qty 1

## 2012-11-25 NOTE — Progress Notes (Signed)
Confirmed pcp as dr Leveda Anna EPIC updated

## 2012-11-25 NOTE — ED Notes (Signed)
Off floor for testing 

## 2012-11-25 NOTE — ED Provider Notes (Signed)
History     CSN: [id number]  Arrival date & time 11/25/12  0825   First MD Initiated Contact with Patient 11/25/12 [phone]      Chief Complaint  Patient presents with  . Abdominal Pain    rt lower abdominal pain    (Consider location/radiation/quality/duration/timing/severity/associated sxs/prior treatment) HPI  Patient is G0 P0, last normal period started last week. She states she's not been sexually active for several years. She states 2 years ago she started having some pain in her right lower quadrant and sometimes around her umbilicus that can radiate into her back. Yesterday she had fever to 101. She describes loss of appetite and also nausea and vomiting for the past 2 and half days. She states walking makes the pain worse, she states laying in shaking makes her pain feel better. She denies dysuria or frequency. She denies any vaginal discharge. She denies any prior abdominal surgery.  PCP none  History reviewed. No pertinent past medical history.  History reviewed. No pertinent past surgical history.  Family History  Problem Relation Age of Onset  . Crohn's disease Other     History  Substance Use Topics  . Smoking status: Never Smoker   . Smokeless tobacco: Not on file  . Alcohol Use: No     Comment: occasionally   college student  OB History    Grav Para Term Preterm Abortions TAB SAB Ect Mult Living                  Review of Systems  All other systems reviewed and are negative.    Allergies  Review of patient's allergies indicates no known allergies.  Home Medications   Current Outpatient Rx  Name  Route  Sig  Dispense  Refill  . ACETAMINOPHEN 500 MG PO TABS   Oral   Take 500 mg by mouth every 6 (six) hours as needed. pain         . NAPROXEN 500 MG PO TABS   Oral   Take 1 tablet (500 mg total) by mouth 2 (two) times daily with a meal.   30 tablet   0   . OXYCODONE-ACETAMINOPHEN 5-325 MG PO TABS   Oral   Take 1 tablet by mouth every 4  (four) hours as needed for pain.   8 tablet   0     BP 140/93  Pulse 75  Temp 97.6 F (36.4 C) (Oral)  Resp 20  LMP 11/25/2012  Vital signs normal    Physical Exam  Nursing note and vitals reviewed. Constitutional: She is oriented to person, place, and time. She appears well-developed and well-nourished.  Non-toxic appearance. She does not appear ill. No distress.  HENT:  Head: Normocephalic and atraumatic.  Right Ear: External ear normal.  Left Ear: External ear normal.  Nose: Nose normal. No mucosal edema or rhinorrhea.  Mouth/Throat: Oropharynx is clear and moist and mucous membranes are normal. No dental abscesses or uvula swelling.  Eyes: Conjunctivae normal and EOM are normal. Pupils are equal, round, and reactive to light.  Neck: Normal range of motion and full passive range of motion without pain. Neck supple.  Cardiovascular: Normal rate, regular rhythm and normal heart sounds.  Exam reveals no gallop and no friction rub.   No murmur heard. Pulmonary/Chest: Effort normal and breath sounds normal. No respiratory distress. She has no wheezes. She has no rhonchi. She has no rales. She exhibits no tenderness and no crepitus.  Abdominal: Soft. Normal appearance  and bowel sounds are normal. She exhibits no distension. There is tenderness. There is guarding. There is no rebound.       Patient is very tender in the right lower quadrant. When I percuss her left lower abdomen she has pain in the right abdomen consistent with Rovsigs sign.  Genitourinary:       13:15 Pelvic exam, normal external genitalia, is small amount of dark blood in the vault. Uterus hard to examine because of thickness of the abdominal wall but she states when the cervix is manipulated it hurts on her right side. Left adnexa is nontender right adnexa has some tenderness but her main pain may be slightly superior to where the ovary lies  Musculoskeletal: Normal range of motion. She exhibits no edema and no  tenderness.       Moves all extremities well.   Neurological: She is alert and oriented to person, place, and time. She has normal strength. No cranial nerve deficit.  Skin: Skin is warm, dry and intact. No rash noted. No erythema. No pallor.  Psychiatric: Her speech is normal and behavior is normal. Her mood appears not anxious.       Flat affect    ED Course  Procedures (including critical care time)   Medications  0.9 %  sodium chloride infusion (0 mL Intravenous Stopped 11/25/12 1148)    Followed by  0.9 %  sodium chloride infusion (1000 mL Intravenous New Bag/Given 11/25/12 1039)  traMADol (ULTRAM) tablet 100 mg (not administered)  metroNIDAZOLE (FLAGYL) 500 MG tablet (not administered)  morphine 4 MG/ML injection 4 mg (4 mg Intravenous Given 11/25/12 0957)  ondansetron (ZOFRAN) injection 4 mg (4 mg Intravenous Given 11/25/12 0957)  morphine 4 MG/ML injection 4 mg (4 mg Intravenous Given 11/25/12 1039)  morphine 4 MG/ML injection 4 mg (4 mg Intravenous Given 11/25/12 1207)  iohexol (OMNIPAQUE) 300 MG/ML solution 100 mL (100 mL Intravenous Contrast Given 11/25/12 1217)  ketorolac (TORADOL) 30 MG/ML injection 30 mg (30 mg Intravenous Given 11/25/12 1408)   Pt continues to have pain, after CT came back negative, pelvic exam performed the [place] ordered.   Pt given results of her tests which are all normal. PT still requesting more pain medication at discharge, given oral tramadol. Pt appears to be in NAD and is usually sleeping when I enter the room.   Results for orders placed during the hospital encounter of 11/25/12  CBC WITH DIFFERENTIAL      Component Value Range   WBC 8.9  4.0 - 10.5 K/uL   RBC 4.40  3.87 - 5.11 MIL/uL   Hemoglobin 13.5  12.0 - 15.0 g/dL   HCT 82.9  56.2 - 13.0 %   MCV 88.2  78.0 - 100.0 fL   MCH 30.7  26.0 - 34.0 pg   MCHC 34.8  30.0 - 36.0 g/dL   RDW 86.5  78.4 - 69.6 %   Platelets 261  150 - 400 K/uL   Neutrophils Relative 67  43 - 77 %   Neutro Abs 6.0  1.7 - 7.7  K/uL   Lymphocytes Relative 24  12 - 46 %   Lymphs Abs 2.1  0.7 - 4.0 K/uL   Monocytes Relative 7  3 - 12 %   Monocytes Absolute 0.6  0.1 - 1.0 K/uL   Eosinophils Relative 2  0 - 5 %   Eosinophils Absolute 0.2  0.0 - 0.7 K/uL   Basophils Relative 0  0 - 1 %  Basophils Absolute 0.0  0.0 - 0.1 K/uL  COMPREHENSIVE METABOLIC PANEL      Component Value Range   Sodium 133 (*) 135 - 145 mEq/L   Potassium 3.4 (*) 3.5 - 5.1 mEq/L   Chloride 98  96 - 112 mEq/L   CO2 25  19 - 32 mEq/L   Glucose, Bld 103 (*) 70 - 99 mg/dL   BUN 13  6 - 23 mg/dL   Creatinine, Ser 4.09  0.50 - 1.10 mg/dL   Calcium 9.3  8.4 - 81.1 mg/dL   Total Protein 7.2  6.0 - 8.3 g/dL   Albumin 4.1  3.5 - 5.2 g/dL   AST 19  0 - 37 U/L   ALT 16  0 - 35 U/L   Alkaline Phosphatase 71  39 - 117 U/L   Total Bilirubin 0.3  0.3 - 1.2 mg/dL   GFR calc non Af Amer 86 (*) >90 mL/min   GFR calc Af Amer >90  >90 mL/min  URINALYSIS, ROUTINE W REFLEX MICROSCOPIC      Component Value Range   Color, Urine YELLOW  YELLOW   APPearance CLEAR  CLEAR   Specific Gravity, Urine 1.023  1.005 - 1.030   pH 5.5  5.0 - 8.0   Glucose, UA NEGATIVE  NEGATIVE mg/dL   Hgb urine dipstick LARGE (*) NEGATIVE   Bilirubin Urine NEGATIVE  NEGATIVE   Ketones, ur NEGATIVE  NEGATIVE mg/dL   Protein, ur NEGATIVE  NEGATIVE mg/dL   Urobilinogen, UA 0.2  0.0 - 1.0 mg/dL   Nitrite NEGATIVE  NEGATIVE   Leukocytes, UA TRACE (*) NEGATIVE  PREGNANCY, URINE      Component Value Range   Preg Test, Ur NEGATIVE  NEGATIVE  URINE MICROSCOPIC-ADD ON      Component Value Range   Squamous Epithelial / LPF FEW (*) RARE   WBC, UA 0-2  <3 WBC/hpf   RBC / HPF 21-50  <3 RBC/hpf   Bacteria, UA FEW (*) RARE  WET PREP, GENITAL      Component Value Range   Yeast Wet Prep HPF POC NONE SEEN  NONE SEEN   Trich, Wet Prep NONE SEEN  NONE SEEN   Clue Cells Wet Prep HPF POC RARE (*) NONE SEEN   WBC, Wet Prep HPF POC FEW (*) NONE SEEN   Laboratory interpretation all normal  except  Bacterial vaginosis    US Transvaginal Non-ob US Art/ven Flow Abd Pelv Doppler Limited US Pelvis Complete  11/25/2012  *RADIOLOGY REPORT*  Clinical Data:  Right lower quadrant pelvic pain  TRANSABDOMINAL AND TRANSVAGINAL ULTRASOUND OF PELVIS DOPPLER ULTRASOUND OF OVARIES  Technique:  Both transabdominal and transvaginal ultrasound examinations of the pelvis were performed. Transabdominal technique was performed for global imaging of the pelvis including uterus, ovaries, adnexal regions, and pelvic cul-de-sac.  It was necessary to proceed with endovaginal exam following the transabdominal exam to visualize the ovaries and endometrium.  Color and duplex Doppler ultrasound was utilized to evaluate blood flow to the ovaries.  Comparison:  CT same date  Findings:  Uterus:  7.8 x 5.6 x 4.4 cm. Retroverted, retroflexed.  Anterior uterine fundal subserosal / intramural fibroid measures 1.8 x 1.8 x 1.5 cm.  No mass effect upon the endometrium.  Endometrium:  11 mm.  Trilaminar without focal abnormality.  Right ovary: 3.3 x 2.1 x 2.0 cm.  Normal.  Left ovary:   2.8 x 1.9 x 1.8 cm.  Normal.  Pulsed Doppler evaluation demonstrates normal low-resistance  arterial and venous waveforms in both ovaries.  IMPRESSION: Normal exam.  No evidence of pelvic mass or other significant abnormality.  No sonographic evidence for ovarian torsion.   Original Report Authenticated By: Christiana Pellant, M.D.    Ct Abdomen Pelvis W Contrast  11/25/2012  *RADIOLOGY REPORT*  Clinical Data: Right lower quadrant abdominal pain for 1.5 days with vomiting and low grade fever.  CT ABDOMEN AND PELVIS WITH CONTRAST  Technique:  Multidetector CT imaging of the abdomen and pelvis was performed following the standard protocol during bolus administration of intravenous contrast.  Contrast: OMNIPAQUE IOHEXOL 300 MG/ML  SOLN  Comparison: Pelvic ultrasound 05/02/2011.  CT urogram 05/01/2011.  Findings: Minimal subpleural nodularity at the left  lung base on image 5 is stable.  The lung bases are otherwise clear.  There is no pleural effusion.  The liver, gallbladder, biliary system and pancreas appear normal. The spleen, adrenal glands and kidneys appear normal.  The stomach, small bowel and colon appear normal.  The appendix appears normal, best seen on coronal image 55.  There is no surrounding inflammatory change.  There is no adnexal mass or lymphadenopathy.  A small anterior uterine fundal fibroid is noted. The urinary bladder appears normal.  There are no worrisome osseous findings.  IMPRESSION: No acute abdominal pelvic findings.  No evidence of appendicitis.   Original Report Authenticated By: Carey Bullocks, M.D.        1. RLQ abdominal pain   2. Bacterial vaginosis     New Prescriptions   METRONIDAZOLE (FLAGYL) 500 MG TABLET    Take 1 tablet (500 mg total) by mouth 2 (two) times daily.   TRAMADOL (ULTRAM) 50 MG TABLET    Take 2 tablets (100 mg total) by mouth every 6 (six) hours as needed for pain.    Plan discharge  Devoria Albe, MD, FACEP   MDM          Ward Givens, MD 11/25/12 [phone]

## 2012-11-25 NOTE — ED Notes (Signed)
Per c/o lower abdominal pain on the right side. Per pt, vomited 4 times in 24 hours. Last BM on 11/24/11- normal. Fever of 101 yesterday- took Tylenol.

## 2012-11-25 NOTE — ED Notes (Signed)
Tolerating po contrast.

## 2012-11-26 LAB — GC/CHLAMYDIA PROBE AMP: CT Probe RNA: NEGATIVE

## 2012-11-27 ENCOUNTER — Encounter (HOSPITAL_COMMUNITY): Payer: Self-pay | Admitting: Emergency Medicine

## 2012-11-27 ENCOUNTER — Emergency Department (HOSPITAL_COMMUNITY)
Admission: EM | Admit: 2012-11-27 | Discharge: 2012-11-27 | Disposition: A | Discharge status: Home / Self Care | Payer: Self-pay | Attending: Emergency Medicine | Admitting: Emergency Medicine

## 2012-11-27 DIAGNOSIS — S41009A Unspecified open wound of unspecified shoulder, initial encounter: Secondary | ICD-10-CM | POA: Insufficient documentation

## 2012-11-27 DIAGNOSIS — W268XXA Contact with other sharp object(s), not elsewhere classified, initial encounter: Secondary | ICD-10-CM | POA: Insufficient documentation

## 2012-11-27 DIAGNOSIS — Z23 Encounter for immunization: Secondary | ICD-10-CM | POA: Insufficient documentation

## 2012-11-27 DIAGNOSIS — Y939 Activity, unspecified: Secondary | ICD-10-CM | POA: Insufficient documentation

## 2012-11-27 DIAGNOSIS — S41112A Laceration without foreign body of left upper arm, initial encounter: Secondary | ICD-10-CM

## 2012-11-27 DIAGNOSIS — Y929 Unspecified place or not applicable: Secondary | ICD-10-CM | POA: Insufficient documentation

## 2012-11-27 MED ORDER — TETANUS-DIPHTH-ACELL PERTUSSIS 5-2.5-18.5 LF-MCG/0.5 IM SUSP
0.5000 mL | Freq: Once | INTRAMUSCULAR | Status: AC
  Administered 2012-11-27: 0.5 mL via INTRAMUSCULAR
  Filled 2012-11-27: qty 0.5
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
## 2012-11-27 NOTE — ED Provider Notes (Signed)
History  Scribed for Maria Crumble, PA-C/Maria Rachel Moulds, MD, the patient was seen in room WTR6/WTR6. This chart was scribed by Candelaria Stagers. The patient's care started at 9:42 PM   CSN: [id number]  Arrival date & time 11/27/12  2124   First MD Initiated Contact with Patient 11/27/12 2137      Chief Complaint  Patient presents with  . Extremity Laceration     The history is provided by the patient. No language interpreter was used.   Maria Sampson is a 22 y.o. female who presents to the Emergency Department complaining of several lacerations to her left forearm after her arm went through a window last night.  She denies pain with movement of the wrist or elbow.  She has no other injuries.  She washed the lacerations and applied neosporin.  She is unsure of last tetanus shot.    No numbness or weakness distal to the injury. Touching it makes it worse. Nothing makes it better.   History reviewed. No pertinent past medical history.  History reviewed. No pertinent past surgical history.  Family History  Problem Relation Age of Onset  . Crohn's disease Other     History  Substance Use Topics  . Smoking status: Never Smoker   . Smokeless tobacco: Not on file  . Alcohol Use: No     Comment: occasionally    OB History    Grav Para Term Preterm Abortions TAB SAB Ect Mult Living                  Review of Systems  Constitutional: Negative for fever and chills.  Respiratory: Negative for shortness of breath.   Gastrointestinal: Negative for nausea and vomiting.  Skin: Positive for wound (multiple lacerations to the distal dorsal forearm).  Neurological: Negative for weakness.  All other systems reviewed and are negative.    Allergies  Review of patient's allergies indicates no known allergies.  Home Medications   Current Outpatient Rx  Name  Route  Sig  Dispense  Refill  . METRONIDAZOLE 500 MG PO TABS   Oral   Take 1 tablet (500 mg total) by mouth 2 (two)  times daily.   14 tablet   0   . NAPROXEN 500 MG PO TABS   Oral   Take 1 tablet (500 mg total) by mouth 2 (two) times daily with a meal.   30 tablet   0   . OXYCODONE-ACETAMINOPHEN 5-325 MG PO TABS   Oral   Take 1 tablet by mouth every 4 (four) hours as needed for pain.   8 tablet   0   . TRAMADOL HCL 50 MG PO TABS   Oral   Take 2 tablets (100 mg total) by mouth every 6 (six) hours as needed for pain.   20 tablet   0     BP 132/86  Pulse 82  Temp 97.8 F (36.6 C) (Oral)  Resp 18  SpO2 100%  LMP 11/25/2012  Physical Exam  Nursing note and vitals reviewed. Constitutional: She is oriented to person, place, and time. She appears well-developed and well-nourished. No distress.  HENT:  Head: Normocephalic and atraumatic.  Eyes: EOM are normal.  Neck: Neck supple. No tracheal deviation present.  Cardiovascular: Normal rate.   Pulmonary/Chest: Effort normal. No respiratory distress.  Musculoskeletal: Normal range of motion.       Normal ROM of the left elbow, wrist, and fingers.  No tenderness.  Good radial pulse.  Multiple  superficial lacerations to the distal dorsal forearm.  Two of the lacerations are gaping.     Neurological: She is alert and oriented to person, place, and time.  Skin: Skin is warm and dry.  Psychiatric: She has a normal mood and affect. Her behavior is normal.    ED Course  Procedures   DIAGNOSTIC STUDIES: Oxygen Saturation is 100% on room air, normal by my interpretation.    COORDINATION OF CARE: 9:47 PM Will clean and apply steri strips to the laceration.  Will update tetanus shot.  Pt understands and agrees.  9:53 PM Wound cleaned by pt using surgical sponge srucb.  Steri strips applied by me. Tetanus given. Pt ready for d/c home.    Labs Reviewed - No data to display No results found.   1. Lacerations of multiple sites of left arm       MDM  See above.   I personally performed the services described in this documentation, which  was scribed in my presence. The recorded information has been reviewed and is accurate.          Lottie Mussel, PA 11/28/12 [phone]

## 2012-11-28 NOTE — ED Provider Notes (Signed)
Medical screening examination/treatment/procedure(s) were performed by non-physician practitioner and as supervising physician I was immediately available for consultation/collaboration.   [name], MD 11/28/12 [phone]

## 2013-01-17 ENCOUNTER — Emergency Department (HOSPITAL_COMMUNITY)
Admission: EM | Admit: 2013-01-17 | Discharge: 2013-01-17 | Disposition: A | Discharge status: Home / Self Care | Payer: Self-pay | Attending: Emergency Medicine | Admitting: Emergency Medicine

## 2013-01-17 ENCOUNTER — Emergency Department (HOSPITAL_COMMUNITY): Payer: Self-pay

## 2013-01-17 DIAGNOSIS — R079 Chest pain, unspecified: Secondary | ICD-10-CM | POA: Insufficient documentation

## 2013-01-17 DIAGNOSIS — R51 Headache: Secondary | ICD-10-CM | POA: Insufficient documentation

## 2013-01-17 DIAGNOSIS — R112 Nausea with vomiting, unspecified: Secondary | ICD-10-CM | POA: Insufficient documentation

## 2013-01-17 MED ORDER — HYDROCODONE-ACETAMINOPHEN 5-325 MG PO TABS
1.0000 | ORAL_TABLET | Freq: Once | ORAL | Status: AC
  Administered 2013-01-17: 1 via ORAL
  Filled 2013-01-17: qty 1

## 2013-01-17 MED ORDER — ALPRAZOLAM 0.5 MG PO TABS
0.5000 mg | ORAL_TABLET | Freq: Once | ORAL | Status: AC
  Administered 2013-01-17: 0.5 mg via ORAL
  Filled 2013-01-17: qty 1

## 2013-01-17 MED ORDER — IBUPROFEN 200 MG PO TABS
600.0000 mg | ORAL_TABLET | Freq: Once | ORAL | Status: AC
  Administered 2013-01-17: 600 mg via ORAL
  Filled 2013-01-17: qty 3

## 2013-01-17 MED ORDER — IBUPROFEN 600 MG PO TABS: 600.0000 mg | ORAL_TABLET | Freq: Three times a day (TID) | ORAL | Status: DC | PRN

## 2013-01-17 MED ORDER — ONDANSETRON 8 MG PO TBDP
8.0000 mg | ORAL_TABLET | Freq: Once | ORAL | Status: AC
  Administered 2013-01-17: 8 mg via ORAL
  Filled 2013-01-17: qty 1

## 2013-01-17 NOTE — ED Notes (Signed)
She c/o central chest "tightness", plus intermittant left-sided headache and some nausea and vomiting x 2 days.  She is in no distress. She denies diaphoresis, fever, nor any other sign of illness.

## 2013-01-17 NOTE — ED Provider Notes (Addendum)
History     CSN: [id number]  Arrival date & time 01/17/13  4098   First MD Initiated Contact with Patient 01/17/13 1906      Chief Complaint  Patient presents with  . Chest Pain    (Consider location/radiation/quality/duration/timing/severity/associated sxs/prior treatment) Patient is a 22 y.o. female presenting with chest pain.  Chest Pain Associated symptoms: headache   Associated symptoms: no abdominal pain, no back pain, no fever, no numbness, no shortness of breath and no weakness   pt c/o intermittent nv in past couple days. Emesis clear, not bloody or bilious. Having normal bms. No constipation or diarrhea. No abd pain or distension. States midline, lower chest sore, constant. Dull. No specific exacerbating or alleviating factors. No change w activity or exertion. No different whether upright or supine. ?hx gerd. No hx cad, chd, no fam hx premature cad. Non smoker. No cocaine use. No pleuritic pain. No leg pain or swelling. No immobility, trauma or recent surgery. Rare non productive cough. No fever or chills. Also notes intermittent left sided frontal headache. Gradual onset. Dull. Mild-mod. No eye pain or change in vision. No numbness/weakness. Similar to prior headaches. No recent head trauma or fall. No sinus pressure or drainage. No neck pain or stiffness.     No past medical history on file.  No past surgical history on file.  Family History  Problem Relation Age of Onset  . Crohn's disease Other     History  Substance Use Topics  . Smoking status: Never Smoker   . Smokeless tobacco: Not on file  . Alcohol Use: No     Comment: occasionally    OB History   Grav Para Term Preterm Abortions TAB SAB Ect Mult Living                  Review of Systems  Constitutional: Negative for fever and chills.  HENT: Negative for neck pain, neck stiffness and sinus pressure.   Eyes: Negative for redness and visual disturbance.  Respiratory: Negative for shortness of  breath.   Cardiovascular: Positive for chest pain. Negative for leg swelling.  Gastrointestinal: Negative for abdominal pain.  Genitourinary: Negative for flank pain.  Musculoskeletal: Negative for back pain.  Skin: Negative for rash.  Neurological: Positive for headaches. Negative for weakness and numbness.  Hematological: Does not bruise/bleed easily.  Psychiatric/Behavioral: Negative for confusion.    Allergies  Review of patient's allergies indicates no known allergies.  Home Medications   Current Outpatient Rx  Name  Route  Sig  Dispense  Refill  . ibuprofen (ADVIL,MOTRIN) 200 MG tablet   Oral   Take 200 mg by mouth every 6 (six) hours as needed for pain.           BP 163/88  Pulse 82  Temp(Src) 98.1 F (36.7 C) (Oral)  Resp 18  SpO2 96%  LMP 01/07/2013  Physical Exam  Nursing note and vitals reviewed. Constitutional: She is oriented to person, place, and time. She appears well-developed and well-nourished. No distress.  HENT:  Head: Atraumatic.  Nose: Nose normal.  Mouth/Throat: Oropharynx is clear and moist.  No sinus or temporal tenderness.  Eyes: Conjunctivae and EOM are normal. Pupils are equal, round, and reactive to light. No scleral icterus.  Neck: Neck supple. No tracheal deviation present. No thyromegaly present.  No stiffness or rigidity.   Cardiovascular: Normal rate, regular rhythm, normal heart sounds and intact distal pulses.  Exam reveals no gallop and no friction rub.  No murmur heard. Pulmonary/Chest: Effort normal and breath sounds normal. No respiratory distress. She exhibits tenderness.  Abdominal: Soft. Normal appearance and bowel sounds are normal. She exhibits no distension and no mass. There is no tenderness. There is no rebound and no guarding.  Genitourinary:  No cva tenderness.  Musculoskeletal: Normal range of motion. She exhibits no edema and no tenderness.  Neurological: She is alert and oriented to person, place, and time. No  cranial nerve deficit.  Motor intact bilaterally. Steady gait.   Skin: Skin is warm and dry. No rash noted. She is not diaphoretic.  Psychiatric: She has a normal mood and affect.    ED Course  Procedures (including critical care time)   Dg Chest 2 View  01/17/2013  *RADIOLOGY REPORT*  Clinical Data: 22 year old female with mid chest pain.  CHEST - 2 VIEW  Comparison: 10/23/2011 and earlier.  Findings: Narrow AP dimension to the chest again noted.  Stable lung volumes.  Cardiac size and mediastinal contours are within normal limits.  Visualized tracheal air column is within normal limits.  Lung parenchyma stable and clear.  No pneumothorax or pleural effusion. No acute osseous abnormality identified.  IMPRESSION: Negative, no acute cardiopulmonary abnormality.   Original Report Authenticated By: Erskine Speed, M.D.    Ct Head Wo Contrast  01/17/2013  *RADIOLOGY REPORT*  Clinical Data: 22 year old female dizziness pressure in the back of the head.  CT HEAD WITHOUT CONTRAST  Technique:  Contiguous axial images were obtained from the base of the skull through the vertex without contrast.  Comparison: 08/01/2011.  Findings: Visualized paranasal sinuses and mastoids are clear. Visualized orbit soft tissues are within normal limits.  No acute osseous abnormality identified.  Visualized scalp soft tissues are within normal limits.  Normal cerebral volume.  No ventriculomegaly. No midline shift, mass effect, or evidence of mass lesion.  Possible partially empty sella. Gray-white matter differentiation is within normal limits throughout the brain.  No evidence of cortically based acute infarction identified.  No acute intracranial hemorrhage identified.  No suspicious intracranial vascular hyperdensity.  IMPRESSION: Stable and normal noncontrast CT appearance of the brain.   Original Report Authenticated By: Erskine Speed, M.D.       MDM  Cxr.  Pt says has ride, does not have to drive. No meds pta.  vicodin  1 po. Motrin po. Zofran.  Reviewed nursing notes and prior charts for additional history.     Date: 01/17/2013  Rate: 78  Rhythm: normal sinus rhythm  QRS Axis: normal  Intervals: normal  ST/T Wave abnormalities: nonspecific ST changes and early repolarization3  Conduction Disutrbances:none  Narrative Interpretation:   Old EKG Reviewed: unchanged ecg appears c/w prior ecg 2012   pt c/o persistent headache. Dull moderate-sev, v anxious.  Pt w persistent concerns headache.  Will get ct..  Ct neg. Pt offered reassurance. Xanax po for anxiety.  Neuro exam normal/nonfocal. Pt comfortable.   Recheck pt comfortable. Appears stable for d/c.       Suzi Roots, MD 01/17/13 2015  Suzi Roots, MD 01/17/13 2126

## 2013-01-17 NOTE — ED Notes (Signed)
Pt reports feelings of tingling down the left side of her arm.  Headache is about the same as before and stomach feels like there is a "knot".  Pt given some gingerale.

## 2013-01-17 NOTE — ED Notes (Addendum)
Pt reports irriation with stomach.  Per Dr. Denton Lank, pt given graham crackers.

## 2013-01-17 NOTE — ED Notes (Signed)
Patient transported to CT 

## 2013-01-19 ENCOUNTER — Encounter (HOSPITAL_COMMUNITY): Payer: Self-pay | Admitting: Emergency Medicine

## 2013-01-19 ENCOUNTER — Emergency Department (HOSPITAL_COMMUNITY)
Admission: EM | Admit: 2013-01-19 | Discharge: 2013-01-19 | Disposition: A | Discharge status: Home / Self Care | Payer: Self-pay | Attending: Emergency Medicine | Admitting: Emergency Medicine

## 2013-01-19 DIAGNOSIS — H5710 Ocular pain, unspecified eye: Secondary | ICD-10-CM | POA: Insufficient documentation

## 2013-01-19 DIAGNOSIS — R209 Unspecified disturbances of skin sensation: Secondary | ICD-10-CM | POA: Insufficient documentation

## 2013-01-19 DIAGNOSIS — J019 Acute sinusitis, unspecified: Secondary | ICD-10-CM | POA: Insufficient documentation

## 2013-01-19 DIAGNOSIS — J3489 Other specified disorders of nose and nasal sinuses: Secondary | ICD-10-CM | POA: Insufficient documentation

## 2013-01-19 DIAGNOSIS — J329 Chronic sinusitis, unspecified: Secondary | ICD-10-CM

## 2013-01-19 DIAGNOSIS — R040 Epistaxis: Secondary | ICD-10-CM | POA: Insufficient documentation

## 2013-01-19 DIAGNOSIS — H538 Other visual disturbances: Secondary | ICD-10-CM | POA: Insufficient documentation

## 2013-01-19 DIAGNOSIS — H53149 Visual discomfort, unspecified: Secondary | ICD-10-CM | POA: Insufficient documentation

## 2013-01-19 MED ORDER — AZITHROMYCIN 250 MG PO TABS: 250.0000 mg | ORAL_TABLET | Freq: Every day | ORAL | Status: DC

## 2013-01-19 MED ORDER — OXYCODONE-ACETAMINOPHEN 5-325 MG PO TABS: 1.0000 | ORAL_TABLET | ORAL | Status: DC | PRN

## 2013-01-19 MED ORDER — METOCLOPRAMIDE HCL 5 MG/ML IJ SOLN
10.0000 mg | Freq: Once | INTRAMUSCULAR | Status: AC
  Administered 2013-01-19: 10 mg via INTRAMUSCULAR
  Filled 2013-01-19: qty 2

## 2013-01-19 MED ORDER — DIPHENHYDRAMINE HCL 25 MG PO CAPS
50.0000 mg | ORAL_CAPSULE | Freq: Once | ORAL | Status: AC
  Administered 2013-01-19: 50 mg via ORAL
  Filled 2013-01-19: qty 2

## 2013-01-19 MED ORDER — DEXAMETHASONE SODIUM PHOSPHATE 10 MG/ML IJ SOLN
10.0000 mg | Freq: Once | INTRAMUSCULAR | Status: AC
  Administered 2013-01-19: 10 mg via INTRAMUSCULAR
  Filled 2013-01-19: qty 1

## 2013-01-19 NOTE — ED Provider Notes (Signed)
History    This chart was scribed for non-physician practitioner working with Lyanne Co, MD by Charolett Bumpers, ED Scribe. This patient was seen in room WTR9/WTR9 and the patient's care was started at 1819.   CSN: [id number]  Arrival date & time 01/19/13  1540   First MD Initiated Contact with Patient 01/19/13 1819      Chief Complaint  Patient presents with  . Headache    The history is provided by the patient. No language interpreter was used.   Maria Sampson is a 22 y.o. female who presents to the Emergency Department complaining of persistent severe left-sided headache for the past 5 days. She states that the headache has been constant but does ease off and becomes dull at times. She reports associated epistaxis, eye pain, blurry vision and photophobia. She also reports left hand numbness but denies any weakness in her grip strength. She states the headache is worse with laying flat. She has taken Ibuprofen and Sudafed without relief. She denies any fevers, cough, neck pain. She was seen in ED a couple days ago where a head CT was obtained and normal. She denies any h/o or family h/o migraines.   History reviewed. No pertinent past medical history.  History reviewed. No pertinent past surgical history.  Family History  Problem Relation Age of Onset  . Crohn's disease Other     History  Substance Use Topics  . Smoking status: Never Smoker   . Smokeless tobacco: Not on file  . Alcohol Use: No     Comment: occasionally    OB History   Grav Para Term Preterm Abortions TAB SAB Ect Mult Living                  Review of Systems A complete 10 system review of systems was obtained and all systems are negative except as noted in the HPI and PMH.   Allergies  Review of patient's allergies indicates no known allergies.  Home Medications   Current Outpatient Rx  Name  Route  Sig  Dispense  Refill  . aspirin-acetaminophen-caffeine (EXCEDRIN MIGRAINE) 250-250-65  MG per tablet   Oral   Take 1 tablet by mouth every 6 (six) hours as needed for pain.         Marland Kitchen ibuprofen (ADVIL,MOTRIN) 800 MG tablet   Oral   Take 800 mg by mouth every 8 (eight) hours as needed for pain.           BP 150/76  Temp(Src) 98.7 F (37.1 C) (Oral)  Resp 19  SpO2 100%  LMP 01/07/2013  Physical Exam  Nursing note and vitals reviewed. Constitutional: She is oriented to person, place, and time. She appears well-developed and well-nourished. No distress.  HENT:  Head: Normocephalic and atraumatic.  Right Ear: External ear normal.  Left Ear: External ear normal.  Mouth/Throat: Oropharynx is clear and moist. No oropharyngeal exudate.  Nasal mucosal is erythematous and swollen, left worse than right. No post-nasal drainage noted. Orophargnx is benign. No sinus tenderness. TM's normal bilaterally.   Eyes: Conjunctivae and EOM are normal. Pupils are equal, round, and reactive to light.  Neck: Normal range of motion. Neck supple. No tracheal deviation present.  Cardiovascular: Normal rate, regular rhythm and normal heart sounds.   Pulmonary/Chest: Effort normal and breath sounds normal. No respiratory distress. She has no wheezes.  Abdominal: Soft. There is no tenderness.  Musculoskeletal: Normal range of motion.  Lymphadenopathy:    She has  no cervical adenopathy.  Neurological: She is alert and oriented to person, place, and time.  Grip strength normal and equal.   Skin: Skin is warm and dry.  Psychiatric: She has a normal mood and affect. Her behavior is normal.    ED Course  Procedures (including critical care time)  DIAGNOSTIC STUDIES: Oxygen Saturation is 100% on room air, normal by my interpretation.    COORDINATION OF CARE:  18:30-Discussed planned course of treatment with the patient including a headache cocktail in ED, who is agreeable at this time.   18:45-Medication Orders: Metoclopramide (Reglan) injection 10 mg-once; Diphenhydramine (Benadryl)  capsule 50 mg-once; Dexamethasone (Decadron) injection 10 mg-once  Labs Reviewed - No data to display   No diagnosis found.  1. Sinus headache 2. Sinusitis  MDM  Headache is improved with medications here. Suspect patient's headache is sinus related given distribution and symptoms of left facial and eye pain, nonfocal neuro exam and recent negative CT Head. Will give prednisone, recommend nasal spray (saline) and trial Z-pack. #5 Percocet for pain.   I personally performed the services described in this documentation, which was scribed in my presence. The recorded information has been reviewed and is accurate.        Arnoldo Hooker, PA-C 01/19/13 1919

## 2013-01-19 NOTE — ED Notes (Signed)
States that she was seen and treated for headache, was given Ibuprofen and the medication is not working. States that she is still having the same symptoms with no relief of pain.

## 2013-01-19 NOTE — ED Provider Notes (Signed)
Medical screening examination/treatment/procedure(s) were performed by non-physician practitioner and as supervising physician I was immediately available for consultation/collaboration.   Lyanne Co, MD 01/19/13 [phone]

## 2013-03-10 ENCOUNTER — Emergency Department (HOSPITAL_COMMUNITY)
Admission: EM | Admit: 2013-03-10 | Discharge: 2013-03-10 | Disposition: A | Discharge status: Home / Self Care | Payer: Self-pay | Attending: Emergency Medicine | Admitting: Emergency Medicine

## 2013-03-10 ENCOUNTER — Emergency Department (HOSPITAL_COMMUNITY): Payer: Self-pay

## 2013-03-10 DIAGNOSIS — R748 Abnormal levels of other serum enzymes: Secondary | ICD-10-CM

## 2013-03-10 DIAGNOSIS — R7401 Elevation of levels of liver transaminase levels: Secondary | ICD-10-CM | POA: Insufficient documentation

## 2013-03-10 DIAGNOSIS — R21 Rash and other nonspecific skin eruption: Secondary | ICD-10-CM | POA: Insufficient documentation

## 2013-03-10 DIAGNOSIS — R7402 Elevation of levels of lactic acid dehydrogenase (LDH): Secondary | ICD-10-CM | POA: Insufficient documentation

## 2013-03-10 LAB — CBC WITH DIFFERENTIAL/PLATELET
Eosinophils Absolute: 0.2 10*3/uL (ref 0.0–0.7)
Eosinophils Relative: 4 % (ref 0–5)
HCT: 39.3 % (ref 36.0–46.0)
Hemoglobin: 13.4 g/dL (ref 12.0–15.0)
Lymphocytes Relative: 31 % (ref 12–46)
Lymphs Abs: 1.9 10*3/uL (ref 0.7–4.0)
MCH: 29.9 pg (ref 26.0–34.0)
MCV: 87.7 fL (ref 78.0–100.0)
Monocytes Relative: 6 % (ref 3–12)
RBC: 4.48 MIL/uL (ref 3.87–5.11)
WBC: 6 10*3/uL (ref 4.0–10.5)

## 2013-03-10 LAB — COMPREHENSIVE METABOLIC PANEL
ALT: 76 U/L — ABNORMAL HIGH (ref 0–35)
Alkaline Phosphatase: 73 U/L (ref 39–117)
BUN: 8 mg/dL (ref 6–23)
CO2: 28 mEq/L (ref 19–32)
Calcium: 9.5 mg/dL (ref 8.4–10.5)
GFR calc Af Amer: >90 mL/min (ref >90)
GFR calc non Af Amer: 89 mL/min — ABNORMAL LOW (ref >90)
Glucose, Bld: 96 mg/dL (ref 70–99)
Total Protein: 6.8 g/dL (ref 6.0–8.3)

## 2013-03-10 LAB — URINALYSIS, MICROSCOPIC ONLY
Bilirubin Urine: NEGATIVE
Ketones, ur: NEGATIVE mg/dL
Leukocytes, UA: NEGATIVE
Nitrite: NEGATIVE
Specific Gravity, Urine: 1.015 (ref 1.005–1.030)
Urobilinogen, UA: 0.2 mg/dL (ref 0.0–1.0)
pH: 7 (ref 5.0–8.0)

## 2013-03-10 NOTE — ED Notes (Signed)
Pt reports red, splotchy rash a couple days after right lower back/flank pain began. Rash lasted a couple days. Then a couple days ago pt noted same type of rash on medial LLE. No rash at this time.

## 2013-03-10 NOTE — ED Notes (Signed)
Pt reports right sided flank pain and rash x2 weeks. Pain 10/10. Denies n/v/d. Difficulty urinating.

## 2013-03-10 NOTE — ED Provider Notes (Signed)
History     CSN: [id number]  Arrival date & time 03/10/13  1615   First MD Initiated Contact with Patient 03/10/13 1740      Chief Complaint  Patient presents with  . Flank Pain  . Rash    (Consider location/radiation/quality/duration/timing/severity/associated sxs/prior treatment) HPI Comments: 22 y.o. Female with no significant PMHx presents right sided abdominal and radiating to the right back. Rash noted in nursing note has since resolved. Pt states pain is 10/10, but does not appear in distress during exam, moving with ease and laughing during conversation. Pt does states that pain is worse with eating although she "tries to eat healthy." Pt states the pain comes and goes and has been persistent for about 2 weeks. Pt has taken no interventions. Has a primary care doctor, but does not see on a regular basis.   Patient is a 22 y.o. female presenting with flank pain and rash.  Flank Pain Pertinent negatives include no chest pain, diaphoresis, fever, headaches, nausea, neck pain, numbness, rash, vomiting or weakness.  Rash Associated symptoms: no diarrhea, no fever, no headaches, no nausea, no shortness of breath and not vomiting     No past medical history on file.  No past surgical history on file.  Family History  Problem Relation Age of Onset  . Crohn's disease Other     History  Substance Use Topics  . Smoking status: Never Smoker   . Smokeless tobacco: Not on file  . Alcohol Use: No     Comment: occasionally    OB History   Grav Para Term Preterm Abortions TAB SAB Ect Mult Living                  Review of Systems  Constitutional: Negative for fever and diaphoresis.  HENT: Negative for neck pain and neck stiffness.   Eyes: Negative for visual disturbance.  Respiratory: Negative for apnea, chest tightness and shortness of breath.   Cardiovascular: Negative for chest pain and palpitations.  Gastrointestinal: Negative for nausea, vomiting, diarrhea and  constipation.  Genitourinary: Positive for flank pain. Negative for dysuria.       Right sided flank/back pain  Musculoskeletal: Negative for gait problem.  Skin: Negative for rash.  Neurological: Negative for dizziness, weakness, light-headedness, numbness and headaches.    Allergies  Review of patient's allergies indicates no known allergies.  Home Medications  No current outpatient prescriptions on file.  BP 133/88  Pulse 87  Temp(Src) 98.9 F (37.2 C)  Resp 16  SpO2 98%  Physical Exam  Nursing note and vitals reviewed. Constitutional: She is oriented to person, place, and time. She appears well-developed and well-nourished. No distress.  HENT:  Head: Normocephalic and atraumatic.  Eyes: Conjunctivae and EOM are normal.  Neck: Normal range of motion. Neck supple.  No meningeal signs  Cardiovascular: Normal rate, regular rhythm and normal heart sounds.  Exam reveals no gallop and no friction rub.   No murmur heard. Pulmonary/Chest: Effort normal and breath sounds normal. No respiratory distress. She has no wheezes. She has no rales. She exhibits no tenderness.  Abdominal: Soft. Bowel sounds are normal. She exhibits no distension. There is no tenderness. There is no rebound and no guarding.  Benign abdominal exam  Genitourinary:  No CVA tenderness  Musculoskeletal: Normal range of motion. She exhibits no edema and no tenderness.  Neurological: She is alert and oriented to person, place, and time. No cranial nerve deficit.  Skin: Skin is warm and dry. She  is not diaphoretic. No erythema.  Rash mentioned in nursing note has resolved. Pt states she thinks she caught scabies from a friend, used her cream, and now it is gone  Psychiatric: She has a normal mood and affect.    ED Course  Procedures (including critical care time)  Labs Reviewed  COMPREHENSIVE METABOLIC PANEL - Abnormal; Notable for the following:    AST 79 (*)    ALT 76 (*)    GFR calc non Af Amer 89 (*)     All other components within normal limits  URINALYSIS, MICROSCOPIC ONLY - Abnormal; Notable for the following:    APPearance CLOUDY (*)    Bacteria, UA FEW (*)    Squamous Epithelial / LPF MANY (*)    All other components within normal limits  CBC WITH DIFFERENTIAL   US Abdomen Complete  03/10/2013  *RADIOLOGY REPORT*  Clinical Data:  Elevated liver function tests.  COMPLETE ABDOMINAL ULTRASOUND  Comparison:  CT of 11/25/2012  Findings:        Gallbladder:  normal, without stone, wall thickening, or       pericholecystic fluid.  Sonographic Murphy's sign was not       elicited.              Common bile duct:  normal, at 3 mm        Liver:  normal in echogenicity without focal lesion.        IVC:  within normal limits.        Pancreas:  normal.        Spleen:  normal in size and echotexture.        Right Kidney:  9.4 cm.  No hydronephrosis.        Left Kidney:  10.7 cm.  No hydronephrosis.  Abdominal aorta:  Non aneurysmal without ascites.  IMPRESSION: Normal abdominal ultrasound.   Original Report Authenticated By: Jeronimo Greaves, M.D.      1. Elevated liver enzymes       MDM  21 y.o. Female with no significant PMHx presents right sided abdominal and radiating to the right back. Pt states pain is 10/10, but does not appear in distress during exam, moving with ease and laughing during conversation. Abdominal exam is benign. No CVA tenderness. Urinalysis returned with no hematuria. Vital signs are WNL. Pt does states that pain is worse with eating although she "tries to eat healthy." Pt states the pain comes and goes and has been persistent for about 2 weeks. Pt has taken no interventions, does not see a primary care doctor on a regular basis.   Will get basic labs and re-evaluate.   Labs returned elevated AST/ALT. Pt is without risk factors for liver dz. Discussed with Dr. Anitra Lauth and agree with decision to image RUQ via [place]  9:18 PM Called [place]. Imaging is just occuring.  9:25 PM Imaging  returned normal gall bladder, without stone, wall thickening, or pericholecystic fluid. Will d/c home with GI contact information, resource guide, information on Affordable Care Act, and discuss the importance of follow up of her elevated liver enzymes in an outpatient setting. Pt understands diagnosis and is in agreement with discharge plan. Discussed discharge plan with Dr. Anitra Lauth who was in agreement.    Glade Nurse, PA-C 03/10/13 2157

## 2013-03-12 NOTE — ED Provider Notes (Signed)
Medical screening examination/treatment/procedure(s) were conducted as a shared visit with non-physician practitioner(s) and myself.  I personally evaluated the patient during the encounter Pt with mild right flank and RUQ tenderness without murphy's signs.  NO actue abd.  Pt is well appearing and no distress.  LFT's elevated for the first time today and pt denies etoh, tylenol and no new meds.  Will f/u to have LFT's followed.  RUQ u/s wnl.  Gwyneth Sprout, MD 03/12/13 2251

## 2013-03-31 ENCOUNTER — Emergency Department (HOSPITAL_COMMUNITY): Payer: Self-pay

## 2013-03-31 ENCOUNTER — Encounter (HOSPITAL_COMMUNITY): Payer: Self-pay | Admitting: *Deleted

## 2013-03-31 ENCOUNTER — Emergency Department (HOSPITAL_COMMUNITY)
Admission: EM | Admit: 2013-03-31 | Discharge: 2013-03-31 | Disposition: A | Discharge status: Home / Self Care | Payer: Self-pay | Attending: Emergency Medicine | Admitting: Emergency Medicine

## 2013-03-31 DIAGNOSIS — Y929 Unspecified place or not applicable: Secondary | ICD-10-CM | POA: Insufficient documentation

## 2013-03-31 DIAGNOSIS — IMO0002 Reserved for concepts with insufficient information to code with codable children: Secondary | ICD-10-CM | POA: Insufficient documentation

## 2013-03-31 DIAGNOSIS — Y9367 Activity, basketball: Secondary | ICD-10-CM | POA: Insufficient documentation

## 2013-03-31 DIAGNOSIS — M2392 Unspecified internal derangement of left knee: Secondary | ICD-10-CM

## 2013-03-31 DIAGNOSIS — W010XXA Fall on same level from slipping, tripping and stumbling without subsequent striking against object, initial encounter: Secondary | ICD-10-CM | POA: Insufficient documentation

## 2013-03-31 DIAGNOSIS — M25469 Effusion, unspecified knee: Secondary | ICD-10-CM | POA: Insufficient documentation

## 2013-03-31 DIAGNOSIS — M2390 Unspecified internal derangement of unspecified knee: Secondary | ICD-10-CM | POA: Insufficient documentation

## 2013-03-31 DIAGNOSIS — M25562 Pain in left knee: Secondary | ICD-10-CM

## 2013-03-31 MED ORDER — IBUPROFEN 800 MG PO TABS
800.0000 mg | ORAL_TABLET | Freq: Once | ORAL | Status: AC
  Administered 2013-03-31: 800 mg via ORAL
  Filled 2013-03-31: qty 1

## 2013-03-31 NOTE — ED Provider Notes (Signed)
History    This chart was scribed for non-physician practitioner Johnnette Gourd working with Celene Kras, MD by Quintella Reichert, ED Scribe. This patient was seen in room WTR8/WTR8 and the patient's care was started at 8:38 PM .   CSN: [id number]  Arrival date & time 03/31/13  2010      Chief Complaint  Patient presents with  . Knee Pain     The history is provided by the patient. No language interpreter was used.    HPI Comments: Maria Sampson is a 22 y.o. female who presents to the Emergency Department complaining of sudden-onset, constant, moderate left knee pain that began subsequent to an injury several hours ago, with accompanying mild swelling.  Pt states that she was playing basketball and slipped and fell onto her left knee and heard something pop.  She states that pain is exacerbated by bearing weight and walking, and reports that she can't walk without it feeling "like something is going to pop."  She states that pain radiates down lower leg with bearing weight and movement.  Pt is ambulatory with pain but reports being unable to walk up or down stairs.  She states that she attempted to treat symptoms with icing and swelling, which relieved pain temporarily.  Pt denies past h/o knee injuries.  Pt denies fever, chills, nausea, emesis, diarrhea, or any other associated symptoms.    History reviewed. No pertinent past medical history.  History reviewed. No pertinent past surgical history.  Family History  Problem Relation Age of Onset  . Crohn's disease Other     History  Substance Use Topics  . Smoking status: Never Smoker   . Smokeless tobacco: Not on file  . Alcohol Use: No    OB History   Grav Para Term Preterm Abortions TAB SAB Ect Mult Living                  Review of Systems  Constitutional: Negative for fever and chills.  Gastrointestinal: Negative for nausea, vomiting and abdominal pain.  Musculoskeletal: Positive for joint swelling (Left knee) and  arthralgias (Left knee).  All other systems reviewed and are negative.    Allergies  Review of patient's allergies indicates no known allergies.  Home Medications  No current outpatient prescriptions on file.  BP 125/83  Pulse 88  Temp(Src) 98.4 F (36.9 C) (Oral)  SpO2 99%  LMP 03/23/2013  Physical Exam  Nursing note and vitals reviewed. Constitutional: She is oriented to person, place, and time. She appears well-developed and well-nourished. No distress.  HENT:  Head: Normocephalic and atraumatic.  Mouth/Throat: Oropharynx is clear and moist.  Eyes: Conjunctivae and EOM are normal.  Neck: Normal range of motion. Neck supple.  Cardiovascular: Normal rate, regular rhythm and normal heart sounds.   Pulmonary/Chest: Effort normal and breath sounds normal. No respiratory distress.  Musculoskeletal: Normal range of motion. She exhibits edema (Mild edema left knee) and tenderness (Left knee, medial jointline).  Unable to assess ligamentous structures of left knee due to pain.  Neurological: She is alert and oriented to person, place, and time. No sensory deficit.  Unable to ambulate without pain, limp.  Skin: Skin is warm and dry.  Psychiatric: She has a normal mood and affect. Her behavior is normal.    ED Course  Procedures (including critical care time)  DIAGNOSTIC STUDIES: Oxygen Saturation is 99% on room air, normal by my interpretation.    COORDINATION OF CARE: 8:46 PM-Discussed treatment plan which includes  knee immobilizer, crutches, ortho f/u.     Labs Reviewed - No data to display Dg Knee Complete 4 Views Left  03/31/2013  *RADIOLOGY REPORT*  Clinical Data: Injured left knee playing basketball.  Pain.  LEFT KNEE - COMPLETE 4+ VIEW  Comparison: None.  Findings: Bony mineralization is normal.  The knee is aligned. Joint spaces are maintained.  Negative for joint effusion or fracture.  No significant degenerative changes.  IMPRESSION: No acute bony abnormality.    Original Report Authenticated By: Britta Mccreedy, M.D.      1. Knee pain, acute, left   2. Internal derangement of knee, left       MDM  Probable meniscal injury. Knee immobilizer and crutches given. She will f/u with ortho. Advised rest, ice, elevation, ibuprofen. Patient states understanding of plan and is agreeable.      I personally performed the services described in this documentation, which was scribed in my presence. The recorded information has been reviewed and is accurate.   Trevor Mace, PA-C 03/31/13 2100

## 2013-03-31 NOTE — ED Notes (Signed)
Pt reports she was playing basketball and "something happened to my left knee." pt ambulatory. Pt reports slight swelling to knee.

## 2013-04-01 NOTE — ED Provider Notes (Signed)
Medical screening examination/treatment/procedure(s) were performed by non-physician practitioner and as supervising physician I was immediately available for consultation/collaboration.    Ceara Wrightson R Tanyia Grabbe, MD 04/01/13 0019 

## 2013-04-07 ENCOUNTER — Other Ambulatory Visit (HOSPITAL_COMMUNITY): Payer: Self-pay | Admitting: Family Medicine

## 2013-04-07 DIAGNOSIS — M25562 Pain in left knee: Secondary | ICD-10-CM

## 2013-04-09 ENCOUNTER — Ambulatory Visit (HOSPITAL_COMMUNITY)
Admission: RE | Admit: 2013-04-09 | Discharge: 2013-04-09 | Disposition: A | Discharge status: Home / Self Care | Payer: Self-pay | Source: Ambulatory Visit | Attending: Family Medicine | Admitting: Family Medicine

## 2013-04-09 DIAGNOSIS — M2420 Disorder of ligament, unspecified site: Secondary | ICD-10-CM | POA: Insufficient documentation

## 2013-04-09 DIAGNOSIS — M629 Disorder of muscle, unspecified: Secondary | ICD-10-CM | POA: Insufficient documentation

## 2013-04-09 DIAGNOSIS — M2240 Chondromalacia patellae, unspecified knee: Secondary | ICD-10-CM | POA: Insufficient documentation

## 2013-04-09 DIAGNOSIS — M25562 Pain in left knee: Secondary | ICD-10-CM

## 2013-04-09 DIAGNOSIS — M25469 Effusion, unspecified knee: Secondary | ICD-10-CM | POA: Insufficient documentation

## 2013-05-02 ENCOUNTER — Emergency Department (HOSPITAL_COMMUNITY)
Admission: EM | Admit: 2013-05-02 | Discharge: 2013-05-02 | Disposition: A | Discharge status: Home / Self Care | Payer: Self-pay | Attending: Emergency Medicine | Admitting: Emergency Medicine

## 2013-05-02 ENCOUNTER — Encounter (HOSPITAL_COMMUNITY): Payer: Self-pay | Admitting: Family Medicine

## 2013-05-02 DIAGNOSIS — S41009A Unspecified open wound of unspecified shoulder, initial encounter: Secondary | ICD-10-CM | POA: Insufficient documentation

## 2013-05-02 DIAGNOSIS — S51812A Laceration without foreign body of left forearm, initial encounter: Secondary | ICD-10-CM

## 2013-05-02 DIAGNOSIS — Y9389 Activity, other specified: Secondary | ICD-10-CM | POA: Insufficient documentation

## 2013-05-02 DIAGNOSIS — W269XXA Contact with unspecified sharp object(s), initial encounter: Secondary | ICD-10-CM | POA: Insufficient documentation

## 2013-05-02 DIAGNOSIS — S41112A Laceration without foreign body of left upper arm, initial encounter: Secondary | ICD-10-CM

## 2013-05-02 DIAGNOSIS — S51809A Unspecified open wound of unspecified forearm, initial encounter: Secondary | ICD-10-CM | POA: Insufficient documentation

## 2013-05-02 DIAGNOSIS — Y92009 Unspecified place in unspecified non-institutional (private) residence as the place of occurrence of the external cause: Secondary | ICD-10-CM | POA: Insufficient documentation

## 2013-05-02 MED ORDER — TRAMADOL HCL 50 MG PO TABS: 50.0000 mg | ORAL_TABLET | Freq: Four times a day (QID) | ORAL | Status: DC | PRN

## 2013-05-02 MED ORDER — IBUPROFEN 600 MG PO TABS: 600.0000 mg | ORAL_TABLET | Freq: Four times a day (QID) | ORAL | Status: DC | PRN

## 2013-05-02 NOTE — ED Notes (Signed)
Geiple, PA at bedside to perform suture repair.
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
## 2013-05-02 NOTE — ED Notes (Signed)
Bacitracin applied to lacerations on L arm, bandages applied over lacerations.

## 2013-05-03 NOTE — ED Provider Notes (Signed)
Medical screening examination/treatment/procedure(s) were performed by non-physician practitioner and as supervising physician I was immediately available for consultation/collaboration.  Laurey Salser, MD 05/03/13 1513 

## 2013-05-04 ENCOUNTER — Encounter (HOSPITAL_COMMUNITY): Payer: Self-pay | Admitting: *Deleted

## 2013-05-04 ENCOUNTER — Inpatient Hospital Stay (HOSPITAL_COMMUNITY)
Admission: AD | Admit: 2013-05-04 | Discharge: 2013-05-04 | Disposition: A | Discharge status: Home / Self Care | Payer: Self-pay | Source: Ambulatory Visit | Attending: Obstetrics & Gynecology | Admitting: Obstetrics & Gynecology

## 2013-05-04 DIAGNOSIS — Z202 Contact with and (suspected) exposure to infections with a predominantly sexual mode of transmission: Secondary | ICD-10-CM | POA: Insufficient documentation

## 2013-05-04 LAB — URINALYSIS, ROUTINE W REFLEX MICROSCOPIC
Hgb urine dipstick: NEGATIVE
Protein, ur: NEGATIVE mg/dL
Urobilinogen, UA: 1 mg/dL (ref 0.0–1.0)

## 2013-05-04 MED ORDER — METRONIDAZOLE 500 MG PO TABS
2000.0000 mg | ORAL_TABLET | Freq: Once | ORAL | Status: AC
  Administered 2013-05-04: 2000 mg via ORAL
  Filled 2013-05-04: qty 4

## 2013-05-04 NOTE — MAU Note (Signed)
Girlfriend was here this morning, thought she had a UTI, was told she had trich.No symptoms, but thought she needed to come in and get treated.Marland Kitchen

## 2013-05-04 NOTE — MAU Provider Note (Signed)
Attestation of Attending Supervision of Advanced Practitioner (CNM/NP): Evaluation and management procedures were performed by the Advanced Practitioner under my supervision and collaboration.  I have reviewed the Advanced Practitioner's note and chart, and I agree with the management and plan.  HARRAWAY-SMITH, Laneka Mcgrory 5:46 PM     

## 2013-05-04 NOTE — MAU Provider Note (Signed)
History     CSN: [id number]  Arrival date and time: 05/04/13 1421   First Provider Initiated Contact with Patient 05/04/13 1518      Chief Complaint  Patient presents with  . Exposure to STD   HPI [name] is a 22 y.o. female who presents to MAU after trichomonas exposure. The patient states that her girlfriend was seen here this morning and treated for trichomonas. She denies vaginal discharge, bleeding, N/V, fever or abdominal pain.   OB History   Grav Para Term Preterm Abortions TAB SAB Ect Mult Living                  History reviewed. No pertinent past medical history.  History reviewed. No pertinent past surgical history.  Family History  Problem Relation Age of Onset  . Crohn's disease Other     History  Substance Use Topics  . Smoking status: Never Smoker   . Smokeless tobacco: Not on file  . Alcohol Use: No    Allergies: No Known Allergies  Prescriptions prior to admission  Medication Sig Dispense Refill  . diclofenac sodium (VOLTAREN) 1 % GEL Apply 1 application topically daily as needed (For knee pain.).      Marland Kitchen traMADol (ULTRAM) 50 MG tablet Take 1 tablet (50 mg total) by mouth every 6 (six) hours as needed for pain.  10 tablet  0    Review of Systems  Constitutional: Negative for fever and malaise/fatigue.  Gastrointestinal: Negative for nausea, vomiting and abdominal pain.  Genitourinary: Negative for dysuria, urgency and frequency.       Neg - vaginal bleeding, discharge   Physical Exam   Blood pressure 136/91, pulse 70, temperature 98.8 F (37.1 C), temperature source Oral, resp. rate 20, weight 257 lb (116.574 kg), last menstrual period 04/22/2013.  Physical Exam  Constitutional: She is oriented to person, place, and time. She appears well-developed and well-nourished. No distress.  HENT:  Head: Normocephalic and atraumatic.  Cardiovascular: Normal rate, regular rhythm and normal heart sounds.   Respiratory: Effort normal and  breath sounds normal. No respiratory distress.  GI: Soft. Bowel sounds are normal. She exhibits no distension and no mass. There is no tenderness. There is no rebound and no guarding.  Neurological: She is alert and oriented to person, place, and time.  Skin: Skin is warm. No erythema.  Psychiatric: She has a normal mood and affect.   Results for orders placed during the hospital encounter of 05/04/13 (from the past 24 hour(s))  URINALYSIS, ROUTINE W REFLEX MICROSCOPIC     Status: None   Collection Time    05/04/13  2:35 PM      Result Value Range   Color, Urine YELLOW  YELLOW   APPearance CLEAR  CLEAR   Specific Gravity, Urine 1.015  1.005 - 1.030   pH 7.0  5.0 - 8.0   Glucose, UA NEGATIVE  NEGATIVE mg/dL   Hgb urine dipstick NEGATIVE  NEGATIVE   Bilirubin Urine NEGATIVE  NEGATIVE   Ketones, ur NEGATIVE  NEGATIVE mg/dL   Protein, ur NEGATIVE  NEGATIVE mg/dL   Urobilinogen, UA 1.0  0.0 - 1.0 mg/dL   Nitrite NEGATIVE  NEGATIVE   Leukocytes, UA NEGATIVE  NEGATIVE    MAU Course  Procedures None  MDM 2 g Flagyl in MAU Patient decline additional STD testing  Assessment and Plan  A: Exposure to trichomnas  P: Discharge home Patient encouraged to return to MAU as needed of if  her symptoms should change or worsen  Freddi Starr, PA-C  05/04/2013, 3:18 PM

## 2013-05-09 ENCOUNTER — Emergency Department (HOSPITAL_COMMUNITY)
Admission: EM | Admit: 2013-05-09 | Discharge: 2013-05-09 | Disposition: A | Discharge status: Home / Self Care | Payer: Self-pay | Attending: Emergency Medicine | Admitting: Emergency Medicine

## 2013-05-09 ENCOUNTER — Encounter (HOSPITAL_COMMUNITY): Payer: Self-pay | Admitting: Emergency Medicine

## 2013-05-09 DIAGNOSIS — Z4802 Encounter for removal of sutures: Secondary | ICD-10-CM | POA: Insufficient documentation

## 2013-05-09 NOTE — ED Notes (Addendum)
multiple laceration to l/arm resolving  with well approximated suture lines

## 2013-05-09 NOTE — ED Provider Notes (Signed)
History     CSN: [id number]  Arrival date & time 05/09/13  1534   First MD Initiated Contact with Patient 05/09/13 1544      Chief Complaint  Patient presents with  . Suture / Staple Removal    muliple suture-approx. 16 in l/arm     (Consider location/radiation/quality/duration/timing/severity/associated sxs/prior treatment) HPI Comments: Patient is a 22 year old female who presents for suture removal. The laceration is located on her left arm. The patient reports subjective healing of the laceration and denies any complications or concerns. The patient denies any pain, erythema, tenderness, drainage of the site. Denies fever, NVD, abdominal pain, numbness/tingling, skin color changes. Patient reports 3 sutures fell out.    Patient is a 22 y.o. female presenting with suture removal.  Suture / Staple Removal    History reviewed. No pertinent past medical history.  History reviewed. No pertinent past surgical history.  Family History  Problem Relation Age of Onset  . Crohn's disease Other     History  Substance Use Topics  . Smoking status: Never Smoker   . Smokeless tobacco: Not on file  . Alcohol Use: No    OB History   Grav Para Term Preterm Abortions TAB SAB Ect Mult Living                  Review of Systems  Skin: Positive for wound.  All other systems reviewed and are negative.    Allergies  Review of patient's allergies indicates no known allergies.  Home Medications   Current Outpatient Rx  Name  Route  Sig  Dispense  Refill  . diclofenac sodium (VOLTAREN) 1 % GEL   Topical   Apply 1 application topically daily as needed (For knee pain.).         Marland Kitchen traMADol (ULTRAM) 50 MG tablet   Oral   Take 1 tablet (50 mg total) by mouth every 6 (six) hours as needed for pain.   10 tablet   0     BP 119/64  Pulse 90  Temp(Src) 98.4 F (36.9 C) (Oral)  Resp 16  Wt 212 lb (96.163 kg)  BMI 31.29 kg/m2  SpO2 99%  LMP 04/22/2013  Physical Exam   Nursing note and vitals reviewed. Constitutional: She is oriented to person, place, and time. She appears well-developed and well-nourished. No distress.  HENT:  Head: Normocephalic and atraumatic.  Eyes: Conjunctivae are normal.  Neck: Normal range of motion.  Cardiovascular: Normal rate and regular rhythm.  Exam reveals no gallop and no friction rub.   No murmur heard. Pulmonary/Chest: Effort normal and breath sounds normal. She has no wheezes. She has no rales. She exhibits no tenderness.  Musculoskeletal: Normal range of motion.  Neurological: She is alert and oriented to person, place, and time.  Speech is goal-oriented. Moves limbs without ataxia.   Skin: Skin is warm and dry.  Multiple well healed lacerations of left arm. Sutures intact.   Psychiatric: She has a normal mood and affect. Her behavior is normal.    ED Course  Procedures (including critical care time)  SUTURE REMOVAL Performed by: Emilia Beck  Consent: Verbal consent obtained. Patient identity confirmed: provided demographic data Time out: Immediately prior to procedure a "time out" was called to verify the correct patient, procedure, equipment, support staff and site/side marked as required.  Location details: left arm  Wound Appearance: clean  Sutures/Staples Removed: 13 (patient reports 3 fell out)  Facility: sutures placed in this facility  Patient tolerance: Patient tolerated the procedure well with no immediate complications.     Labs Reviewed - No data to display No results found.   1. Encounter for removal of sutures       MDM  4:23 PM Sutures removed without difficulty. Lacerations appear well healed. Vitals stable and patient afebrile.         Emilia Beck, PA-C 05/09/13 1629

## 2013-05-12 NOTE — ED Provider Notes (Signed)
Medical screening examination/treatment/procedure(s) were performed by non-physician practitioner and as supervising physician I was immediately available for consultation/collaboration.  Shelda Jakes, MD 05/12/13 [phone]

## 2013-05-29 ENCOUNTER — Emergency Department (HOSPITAL_COMMUNITY)
Admission: EM | Admit: 2013-05-29 | Discharge: 2013-05-29 | Disposition: A | Discharge status: Home / Self Care | Payer: Self-pay | Attending: Emergency Medicine | Admitting: Emergency Medicine

## 2013-05-29 DIAGNOSIS — E876 Hypokalemia: Secondary | ICD-10-CM | POA: Insufficient documentation

## 2013-05-29 DIAGNOSIS — F489 Nonpsychotic mental disorder, unspecified: Secondary | ICD-10-CM | POA: Insufficient documentation

## 2013-05-29 DIAGNOSIS — F10121 Alcohol abuse with intoxication delirium: Secondary | ICD-10-CM

## 2013-05-29 DIAGNOSIS — Z79899 Other long term (current) drug therapy: Secondary | ICD-10-CM | POA: Insufficient documentation

## 2013-05-29 DIAGNOSIS — F1010 Alcohol abuse, uncomplicated: Secondary | ICD-10-CM | POA: Insufficient documentation

## 2013-05-29 LAB — SALICYLATE LEVEL: Salicylate Lvl: 4.8 mg/dL (ref 2.8–20.0)

## 2013-05-29 LAB — COMPREHENSIVE METABOLIC PANEL
Albumin: 4 g/dL (ref 3.5–5.2)
BUN: 9 mg/dL (ref 6–23)
Creatinine, Ser: 0.88 mg/dL (ref 0.50–1.10)
Potassium: 2.8 mEq/L — ABNORMAL LOW (ref 3.5–5.1)
Total Protein: 7 g/dL (ref 6.0–8.3)

## 2013-05-29 LAB — CK: Total CK: 249 U/L — ABNORMAL HIGH (ref 7–177)

## 2013-05-29 LAB — CBC
HCT: 38.1 % (ref 36.0–46.0)
MCHC: 35.2 g/dL (ref 30.0–36.0)
MCV: 86.6 fL (ref 78.0–100.0)
RDW: 12.9 % (ref 11.5–15.5)

## 2013-05-29 LAB — ACETAMINOPHEN LEVEL: Acetaminophen (Tylenol), Serum: <15 ug/mL (ref 10–30)

## 2013-05-29 LAB — ETHANOL: Alcohol, Ethyl (B): 196 mg/dL — ABNORMAL HIGH (ref 0–11)

## 2013-05-29 MED ORDER — ZIPRASIDONE MESYLATE 20 MG IM SOLR
INTRAMUSCULAR | Status: AC
  Administered 2013-05-29: 20 mg via INTRAMUSCULAR
  Filled 2013-05-29: qty 20

## 2013-05-29 MED ORDER — POTASSIUM CHLORIDE CRYS ER 20 MEQ PO TBCR
40.0000 meq | EXTENDED_RELEASE_TABLET | Freq: Once | ORAL | Status: AC
  Administered 2013-05-29: 40 meq via ORAL
  Filled 2013-05-29: qty 2

## 2013-05-29 MED ORDER — ZIPRASIDONE MESYLATE 20 MG IM SOLR: 20.0000 mg | Freq: Once | INTRAMUSCULAR | Status: AC

## 2013-05-29 NOTE — ED Provider Notes (Signed)
Pt has been ambulatory, oriented.  Once pt has a ride home, may be discharged.    Gavin Pound. Oletta Lamas, MD 05/29/13 [phone]

## 2013-05-29 NOTE — ED Notes (Signed)
Restraints removed  Pt sleeping  No acute distress noted

## 2013-05-29 NOTE — Progress Notes (Signed)
[facility] did not get to see patient but will be sending her information about the [facility], using the address provided.
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
## 2013-06-25 ENCOUNTER — Encounter (HOSPITAL_COMMUNITY): Payer: Self-pay | Admitting: Emergency Medicine

## 2013-06-25 ENCOUNTER — Emergency Department (HOSPITAL_COMMUNITY)
Admission: EM | Admit: 2013-06-25 | Discharge: 2013-06-25 | Disposition: A | Discharge status: Home / Self Care | Payer: Self-pay | Attending: Emergency Medicine | Admitting: Emergency Medicine

## 2013-06-25 DIAGNOSIS — H6591 Unspecified nonsuppurative otitis media, right ear: Secondary | ICD-10-CM

## 2013-06-25 DIAGNOSIS — H6590 Unspecified nonsuppurative otitis media, unspecified ear: Secondary | ICD-10-CM | POA: Insufficient documentation

## 2013-06-25 DIAGNOSIS — R590 Localized enlarged lymph nodes: Secondary | ICD-10-CM

## 2013-06-25 DIAGNOSIS — R599 Enlarged lymph nodes, unspecified: Secondary | ICD-10-CM | POA: Insufficient documentation

## 2013-06-25 DIAGNOSIS — M542 Cervicalgia: Secondary | ICD-10-CM | POA: Insufficient documentation

## 2013-06-25 DIAGNOSIS — F17200 Nicotine dependence, unspecified, uncomplicated: Secondary | ICD-10-CM | POA: Insufficient documentation

## 2013-06-25 MED ORDER — IBUPROFEN 800 MG PO TABS: 800.0000 mg | ORAL_TABLET | Freq: Three times a day (TID) | ORAL | Status: DC

## 2013-06-25 MED ORDER — AMOXICILLIN 500 MG PO CAPS: 500.0000 mg | ORAL_CAPSULE | Freq: Three times a day (TID) | ORAL | Status: DC

## 2013-06-25 MED ORDER — AMOXICILLIN 500 MG PO CAPS
500.0000 mg | ORAL_CAPSULE | Freq: Once | ORAL | Status: AC
  Administered 2013-06-25: 500 mg via ORAL
  Filled 2013-06-25: qty 1

## 2013-06-25 MED ORDER — IBUPROFEN 800 MG PO TABS
800.0000 mg | ORAL_TABLET | Freq: Once | ORAL | Status: AC
  Administered 2013-06-25: 800 mg via ORAL
  Filled 2013-06-25: qty 1

## 2013-06-25 NOTE — ED Provider Notes (Signed)
CSN: [id number]     Arrival date & time 06/25/13  0116 History     First MD Initiated Contact with Patient 06/25/13 0129     Chief Complaint  Patient presents with  . lump on neck    HPI Maria Sampson is a 22 y.o. female presenting with a lump in her neck and associated pain, it's been moderate, she's not taken anything for it. He's got worse, she noticed this first 3 days ago, it is a throbbing pain is been associated with some ear pressure. No sore throat, no fevers, no stiff neck, no headaches, no shortness of breath, no productive cough, no chest pain, no myalgias.   History reviewed. No pertinent past medical history. History reviewed. No pertinent past surgical history. Family History  Problem Relation Age of Onset  . Crohn's disease Other    History  Substance Use Topics  . Smoking status: Current Some Day Smoker  . Smokeless tobacco: Not on file  . Alcohol Use: Yes     Comment: social   OB History   Grav Para Term Preterm Abortions TAB SAB Ect Mult Living                 Review of Systems At least 10pt or greater review of systems completed and are negative except where specified in the HPI.  Allergies  Review of patient's allergies indicates no known allergies.  Home Medications   Current Outpatient Rx  Name  Route  Sig  Dispense  Refill  . amoxicillin (AMOXIL) 500 MG capsule   Oral   Take 1 capsule (500 mg total) by mouth 3 (three) times daily.   21 capsule   0   . ibuprofen (ADVIL,MOTRIN) 800 MG tablet   Oral   Take 1 tablet (800 mg total) by mouth 3 (three) times daily.   21 tablet   0    BP 134/78  Pulse 90  Temp(Src) 98.5 F (36.9 C) (Oral)  Resp 20  Ht 5\' 9"  (1.753 m)  Wt 200 lb (90.719 kg)  BMI 29.52 kg/m2  SpO2 98%  LMP 05/31/2013 Physical Exam  Nursing notes reviewed.  Electronic medical record reviewed. VITAL SIGNS:   Filed Vitals:   06/25/13 0120  BP: 134/78  Pulse: 90  Temp: 98.5 F (36.9 C)  TempSrc: Oral  Resp: 20   Height: 5\' 9"  (1.753 m)  Weight: 200 lb (90.719 kg)  SpO2: 98%   CONSTITUTIONAL: Awake, oriented, appears non-toxic HENT: Atraumatic, normocephalic, oral mucosa pink and moist, airway patent. Nares patent without drainage. External ears normal. Left TM is normal. Right TM is erythematous with purulence effusion.  EYES: Conjunctiva clear, EOMI, PERRLA NECK: Trachea midline, patient has some mild lymphadenopathy that is sore on the right, confirmed on bedside ultrasound, supple CARDIOVASCULAR: Normal heart rate, Normal rhythm, No murmurs, rubs, gallops PULMONARY/CHEST: Clear to auscultation, no rhonchi, wheezes, or rales. Symmetrical breath sounds. Non-tender. ABDOMINAL: Non-distended, soft, non-tender - no rebound or guarding.  BS normal. NEUROLOGIC: Non-focal, moving all four extremities, no gross sensory or motor deficits. EXTREMITIES: No clubbing, cyanosis, or edema SKIN: Warm, Dry, No erythema, No rash  ED Course   Procedures (including critical care time)  Labs Reviewed - No data to display  No results found. 1. Right otitis media with effusion   2. Cervical lymphadenopathy     MDM  She presenting with right otitis media diffusion into common cervical lymphadenopathy/lymphadenitis. The patient was placed on antibiotics, or otitis. Patient given ibuprofen  for pain relief. No emergent condition identified at this time, patient's condition not suggestive of a deep neck space infection, do not suspect lovings angina, PTA, RPA.  Patient discharged home stable and good condition, she can follow up with Dr. Leveda Anna. Return to the emergency department for worsening conditions. I think any imaging or labs indicated at this time.  Jones Skene, MD 06/25/13 1610

## 2013-06-25 NOTE — ED Notes (Signed)
Pt states she has a lump on the right side of her neck she noticed 3 days ago  Pt states it throbs at times

## 2013-07-26 ENCOUNTER — Encounter (HOSPITAL_COMMUNITY): Payer: Self-pay | Admitting: Emergency Medicine

## 2013-07-26 ENCOUNTER — Emergency Department (HOSPITAL_COMMUNITY)
Admission: EM | Admit: 2013-07-26 | Discharge: 2013-07-26 | Disposition: A | Discharge status: Home / Self Care | Payer: Medicaid Other | Attending: Emergency Medicine | Admitting: Emergency Medicine

## 2013-07-26 DIAGNOSIS — F17200 Nicotine dependence, unspecified, uncomplicated: Secondary | ICD-10-CM | POA: Insufficient documentation

## 2013-07-26 DIAGNOSIS — E669 Obesity, unspecified: Secondary | ICD-10-CM | POA: Insufficient documentation

## 2013-07-26 DIAGNOSIS — R112 Nausea with vomiting, unspecified: Secondary | ICD-10-CM | POA: Insufficient documentation

## 2013-07-26 DIAGNOSIS — Z3202 Encounter for pregnancy test, result negative: Secondary | ICD-10-CM | POA: Insufficient documentation

## 2013-07-26 DIAGNOSIS — R109 Unspecified abdominal pain: Secondary | ICD-10-CM

## 2013-07-26 DIAGNOSIS — R630 Anorexia: Secondary | ICD-10-CM | POA: Insufficient documentation

## 2013-07-26 DIAGNOSIS — R1013 Epigastric pain: Secondary | ICD-10-CM | POA: Insufficient documentation

## 2013-07-26 LAB — URINALYSIS, ROUTINE W REFLEX MICROSCOPIC
Glucose, UA: NEGATIVE mg/dL
Ketones, ur: NEGATIVE mg/dL
Leukocytes, UA: NEGATIVE
Nitrite: NEGATIVE
Protein, ur: 30 mg/dL — AB
Urobilinogen, UA: 1 mg/dL (ref 0.0–1.0)

## 2013-07-26 LAB — COMPREHENSIVE METABOLIC PANEL
AST: 18 U/L (ref 0–37)
Albumin: 3.7 g/dL (ref 3.5–5.2)
Alkaline Phosphatase: 68 U/L (ref 39–117)
BUN: 14 mg/dL (ref 6–23)
Chloride: 105 mEq/L (ref 96–112)
Creatinine, Ser: 0.91 mg/dL (ref 0.50–1.10)
Potassium: 3.7 mEq/L (ref 3.5–5.1)
Total Bilirubin: 0.4 mg/dL (ref 0.3–1.2)
Total Protein: 6.5 g/dL (ref 6.0–8.3)

## 2013-07-26 LAB — CBC WITH DIFFERENTIAL/PLATELET
Basophils Absolute: 0 10*3/uL (ref 0.0–0.1)
Basophils Relative: 0 % (ref 0–1)
Eosinophils Absolute: 0.1 10*3/uL (ref 0.0–0.7)
MCH: 30.6 pg (ref 26.0–34.0)
MCHC: 34.1 g/dL (ref 30.0–36.0)
Neutro Abs: 3.9 10*3/uL (ref 1.7–7.7)
Neutrophils Relative %: 65 % (ref 43–77)
RDW: 12.8 % (ref 11.5–15.5)

## 2013-07-26 LAB — LIPASE, BLOOD: Lipase: 25 U/L (ref 11–59)

## 2013-07-26 LAB — PREGNANCY, URINE: Preg Test, Ur: NEGATIVE

## 2013-07-26 LAB — URINE MICROSCOPIC-ADD ON

## 2013-07-26 MED ORDER — SODIUM CHLORIDE 0.9 % IV BOLUS (SEPSIS)
500.0000 mL | Freq: Once | INTRAVENOUS | Status: AC
  Administered 2013-07-26: 500 mL via INTRAVENOUS

## 2013-07-26 MED ORDER — OXYCODONE-ACETAMINOPHEN 5-325 MG PO TABS: 1.0000 | ORAL_TABLET | Freq: Four times a day (QID) | ORAL | Status: DC | PRN

## 2013-07-26 MED ORDER — MORPHINE SULFATE 4 MG/ML IJ SOLN
4.0000 mg | Freq: Once | INTRAMUSCULAR | Status: DC
  Filled 2013-07-26: qty 1

## 2013-07-26 MED ORDER — ONDANSETRON HCL 4 MG/2ML IJ SOLN
4.0000 mg | Freq: Once | INTRAMUSCULAR | Status: AC
  Administered 2013-07-26: 4 mg via INTRAVENOUS
  Filled 2013-07-26: qty 2

## 2013-07-26 MED ORDER — PROMETHAZINE HCL 25 MG PO TABS: 25.0000 mg | ORAL_TABLET | Freq: Four times a day (QID) | ORAL | Status: DC | PRN

## 2013-07-26 NOTE — ED Notes (Signed)
3 days of n/v abd pain rt upper rib pain denies any urinary sx.

## 2013-07-26 NOTE — ED Provider Notes (Signed)
CSN: [id number]     Arrival date & time 07/26/13  1017 History   First MD Initiated Contact with Patient 07/26/13 1056     Chief Complaint  Patient presents with  . Abdominal Pain  . Nausea  . Emesis   (Consider location/radiation/quality/duration/timing/severity/associated sxs/prior Treatment) Patient is a 22 y.o. female presenting with abdominal pain and vomiting. The history is provided by the patient.  Abdominal Pain Associated symptoms: nausea and vomiting   Associated symptoms: no chest pain, no diarrhea, no fever and no shortness of breath   Emesis Associated symptoms: abdominal pain   Associated symptoms: no diarrhea and no headaches    patient began with periumbilical abdominal pain 2-3 days ago. He since developed the epigastric area. She's had decreased appetite. She's had some vomiting of a "dense" substance. She had some diarrhea for a couple days but that has resolved. No fevers. No mass. She's had a decreased appetite. She denies possibility of pregnancy. The pain is crampy.  History reviewed. No pertinent past medical history. History reviewed. No pertinent past surgical history. Family History  Problem Relation Age of Onset  . Crohn's disease Other    History  Substance Use Topics  . Smoking status: Current Some Day Smoker  . Smokeless tobacco: Not on file  . Alcohol Use: Yes     Comment: social   OB History   Grav Para Term Preterm Abortions TAB SAB Ect Mult Living                 Review of Systems  Constitutional: Positive for appetite change. Negative for fever and activity change.  HENT: Negative for neck stiffness.   Eyes: Negative for pain.  Respiratory: Negative for chest tightness and shortness of breath.   Cardiovascular: Negative for chest pain and leg swelling.  Gastrointestinal: Positive for nausea, vomiting and abdominal pain. Negative for diarrhea.  Genitourinary: Negative for flank pain.  Musculoskeletal: Negative for back pain.  Skin:  Negative for rash.  Neurological: Negative for weakness, numbness and headaches.  Psychiatric/Behavioral: Negative for behavioral problems.    Allergies  Review of patient's allergies indicates no known allergies.  Home Medications   Current Outpatient Rx  Name  Route  Sig  Dispense  Refill  . alum & mag hydroxide-simeth (MAALOX/MYLANTA) 200-200-20 MG/5ML suspension   Oral   Take 15 mLs by mouth every 6 (six) hours as needed for indigestion.         Marland Kitchen oxyCODONE-acetaminophen (PERCOCET/ROXICET) 5-325 MG per tablet   Oral   Take 1-2 tablets by mouth every 6 (six) hours as needed for pain.   8 tablet   0   . promethazine (PHENERGAN) 25 MG tablet   Oral   Take 1 tablet (25 mg total) by mouth every 6 (six) hours as needed for nausea.   20 tablet   0    BP 120/69  Pulse 60  Temp(Src) 98 F (36.7 C) (Oral)  Resp 16  SpO2 100% Physical Exam  Nursing note and vitals reviewed. Constitutional: She is oriented to person, place, and time. She appears well-developed and well-nourished.  Patient is obese  HENT:  Head: Normocephalic and atraumatic.  Eyes: EOM are normal. Pupils are equal, round, and reactive to light.  Neck: Normal range of motion. Neck supple.  Cardiovascular: Normal rate, regular rhythm and normal heart sounds.   No murmur heard. Pulmonary/Chest: Effort normal and breath sounds normal. No respiratory distress. She has no wheezes. She has no rales.  Abdominal: Soft.  Bowel sounds are normal. She exhibits no distension. There is tenderness. There is no rebound and no guarding.  Mild epigastric tenderness without rebound or guarding. No hernias palpated.  Musculoskeletal: Normal range of motion.  Neurological: She is alert and oriented to person, place, and time. No cranial nerve deficit.  Skin: Skin is warm and dry.  Psychiatric: She has a normal mood and affect. Her speech is normal.    ED Course  Procedures (including critical care time) Labs Review Labs  Reviewed  URINALYSIS, ROUTINE W REFLEX MICROSCOPIC - Abnormal; Notable for the following:    Protein, ur 30 (*)    All other components within normal limits  CBC WITH DIFFERENTIAL - Abnormal; Notable for the following:    HCT 35.2 (*)    All other components within normal limits  COMPREHENSIVE METABOLIC PANEL - Abnormal; Notable for the following:    GFR calc non Af Amer 88 (*)    All other components within normal limits  PREGNANCY, URINE  LIPASE, BLOOD  URINE MICROSCOPIC-ADD ON   Imaging Review No results found.  MDM   1. Abdominal pain   2. Nausea and vomiting    Patient with nausea vomiting and some diarrhea. Upper abdominal pain. Has been previously imaged for similar pain and does not show a clear cause. Patient has no gallstones. Patient feels somewhat better after treatment will be discharged home. Labs reassuring    Juliet Rude. Rubin Payor, MD 07/26/13 1553

## 2013-07-26 NOTE — ED Notes (Signed)
Pt states zofran did help relieve her nausea, but reports abd pain that "comes in waves", different from nausea but pt cannot describe pain. Will notify MD

## 2013-07-26 NOTE — ED Notes (Signed)
Patient is aware that we need a urine specimen. States she is unable to urinate at this time 

## 2013-08-25 ENCOUNTER — Emergency Department (HOSPITAL_COMMUNITY)
Admission: EM | Admit: 2013-08-25 | Discharge: 2013-08-25 | Disposition: A | Discharge status: Home / Self Care | Payer: Medicaid Other | Attending: Emergency Medicine | Admitting: Emergency Medicine

## 2013-08-25 ENCOUNTER — Encounter (HOSPITAL_COMMUNITY): Payer: Self-pay | Admitting: Emergency Medicine

## 2013-08-25 DIAGNOSIS — H53149 Visual discomfort, unspecified: Secondary | ICD-10-CM | POA: Insufficient documentation

## 2013-08-25 DIAGNOSIS — R509 Fever, unspecified: Secondary | ICD-10-CM | POA: Insufficient documentation

## 2013-08-25 DIAGNOSIS — R1031 Right lower quadrant pain: Secondary | ICD-10-CM | POA: Insufficient documentation

## 2013-08-25 DIAGNOSIS — R109 Unspecified abdominal pain: Secondary | ICD-10-CM

## 2013-08-25 DIAGNOSIS — R5381 Other malaise: Secondary | ICD-10-CM | POA: Insufficient documentation

## 2013-08-25 DIAGNOSIS — Z87891 Personal history of nicotine dependence: Secondary | ICD-10-CM | POA: Insufficient documentation

## 2013-08-25 DIAGNOSIS — R51 Headache: Secondary | ICD-10-CM | POA: Insufficient documentation

## 2013-08-25 DIAGNOSIS — R1110 Vomiting, unspecified: Secondary | ICD-10-CM | POA: Insufficient documentation

## 2013-08-25 DIAGNOSIS — R630 Anorexia: Secondary | ICD-10-CM | POA: Insufficient documentation

## 2013-08-25 DIAGNOSIS — J3489 Other specified disorders of nose and nasal sinuses: Secondary | ICD-10-CM | POA: Insufficient documentation

## 2013-08-25 LAB — COMPREHENSIVE METABOLIC PANEL
ALT: 26 U/L (ref 0–35)
Alkaline Phosphatase: 76 U/L (ref 39–117)
BUN: 9 mg/dL (ref 6–23)
CO2: 25 mEq/L (ref 19–32)
Chloride: 106 mEq/L (ref 96–112)
GFR calc Af Amer: 88 mL/min — ABNORMAL LOW (ref >90)
GFR calc non Af Amer: 76 mL/min — ABNORMAL LOW (ref >90)
Glucose, Bld: 97 mg/dL (ref 70–99)
Potassium: 3.9 mEq/L (ref 3.5–5.1)
Sodium: 139 mEq/L (ref 135–145)
Total Bilirubin: 0.7 mg/dL (ref 0.3–1.2)

## 2013-08-25 LAB — CBC WITH DIFFERENTIAL/PLATELET
Hemoglobin: 13 g/dL (ref 12.0–15.0)
Lymphocytes Relative: 8 % — ABNORMAL LOW (ref 12–46)
Lymphs Abs: 1 10*3/uL (ref 0.7–4.0)
MCH: 31.3 pg (ref 26.0–34.0)
Monocytes Relative: 4 % (ref 3–12)
Neutrophils Relative %: 87 % — ABNORMAL HIGH (ref 43–77)
Platelets: 235 10*3/uL (ref 150–400)
RBC: 4.16 MIL/uL (ref 3.87–5.11)
WBC: 12.8 10*3/uL — ABNORMAL HIGH (ref 4.0–10.5)

## 2013-08-25 MED ORDER — DEXAMETHASONE SODIUM PHOSPHATE 10 MG/ML IJ SOLN
10.0000 mg | Freq: Once | INTRAMUSCULAR | Status: AC
  Administered 2013-08-25: 10 mg via INTRAVENOUS
  Filled 2013-08-25: qty 1

## 2013-08-25 MED ORDER — ONDANSETRON HCL 4 MG/2ML IJ SOLN: 4.0000 mg | Freq: Once | INTRAMUSCULAR | Status: DC

## 2013-08-25 MED ORDER — SODIUM CHLORIDE 0.9 % IV BOLUS (SEPSIS)
1000.0000 mL | Freq: Once | INTRAVENOUS | Status: AC
  Administered 2013-08-25: 1000 mL via INTRAVENOUS

## 2013-08-25 MED ORDER — DIPHENHYDRAMINE HCL 50 MG/ML IJ SOLN
25.0000 mg | Freq: Once | INTRAMUSCULAR | Status: AC
  Administered 2013-08-25: 25 mg via INTRAVENOUS
  Filled 2013-08-25: qty 1

## 2013-08-25 MED ORDER — METOCLOPRAMIDE HCL 5 MG/ML IJ SOLN
10.0000 mg | Freq: Once | INTRAMUSCULAR | Status: AC
  Administered 2013-08-25: 10 mg via INTRAVENOUS
  Filled 2013-08-25: qty 2

## 2013-08-25 NOTE — ED Provider Notes (Signed)
CSN: [id number]     Arrival date & time 08/25/13  1829 History   First MD Initiated Contact with Patient 08/25/13 1833     Chief Complaint  Patient presents with  . Emesis  . Headache   (Consider location/radiation/quality/duration/timing/severity/associated sxs/prior Treatment) HPI Comments: 22 y/o female with headache, n/v, RLQ abdominal pain. Headache in left frontal region began yesterday and of gradual onset. Associated with nausea and photophobia. She reports three episodes on nonbloody, nonbilious emesis. RLQ pain began after vomiting and is constant. Reports fever to 101 yesterday. No prior abdominal surgeries, diarrhea, vaginal bleeding/discharge, dysuria.   The history is provided by the patient. No language interpreter was used.    History reviewed. No pertinent past medical history. History reviewed. No pertinent past surgical history. Family History  Problem Relation Age of Onset  . Crohn's disease Other    History  Substance Use Topics  . Smoking status: Former Smoker    Types: Cigarettes    Quit date: 06/25/2013  . Smokeless tobacco: Not on file  . Alcohol Use: Yes     Comment: social   OB History   Grav Para Term Preterm Abortions TAB SAB Ect Mult Living                 Review of Systems  Constitutional: Positive for fever, appetite change and fatigue.  HENT: Positive for congestion. Negative for sore throat, trouble swallowing, neck pain, neck stiffness, dental problem and sinus pressure.   Eyes: Positive for photophobia. Negative for visual disturbance.  Respiratory: Negative for chest tightness and shortness of breath.   Gastrointestinal: Positive for nausea, vomiting and abdominal pain. Negative for diarrhea, constipation, blood in stool and abdominal distention.  Genitourinary: Negative for dysuria, flank pain, vaginal bleeding and vaginal discharge.  Neurological: Positive for weakness (generalized) and headaches. Negative for tremors, seizures, speech  difficulty and numbness.  All other systems reviewed and are negative.    Allergies  Review of patient's allergies indicates no known allergies.  Home Medications   Current Outpatient Rx  Name  Route  Sig  Dispense  Refill  . Pseudoephedrine HCl (SUDAFED PO)   Oral   Take 2 capsules by mouth once.          BP 114/71  Pulse 92  Temp(Src) 97.9 F (36.6 C) (Oral)  Resp 20  SpO2 100%  LMP 08/04/2013 Physical Exam  Vitals reviewed. Constitutional: She is oriented to person, place, and time. She appears well-developed and well-nourished. No distress.  HENT:  Head: Atraumatic.  Nose: Right sinus exhibits no maxillary sinus tenderness and no frontal sinus tenderness. Left sinus exhibits no maxillary sinus tenderness and no frontal sinus tenderness.  Mouth/Throat: Oropharynx is clear and moist.  Eyes: Conjunctivae and EOM are normal. Pupils are equal, round, and reactive to light.  Neck: Normal range of motion. Neck supple. No Brudzinski's sign and no Kernig's sign noted.  Cardiovascular: Normal rate, regular rhythm, normal heart sounds and intact distal pulses.   Pulmonary/Chest: Breath sounds normal.  Abdominal: Soft. Bowel sounds are normal. She exhibits no distension. There is tenderness in the right lower quadrant. There is no rigidity, no rebound, no guarding and no CVA tenderness.  Neurological: She is alert and oriented to person, place, and time. She has normal strength and normal reflexes. No cranial nerve deficit or sensory deficit. Coordination and gait normal.  Skin: Skin is warm and dry.    ED Course  Procedures (including critical care time) Labs Review Labs Reviewed  CBC WITH DIFFERENTIAL  COMPREHENSIVE METABOLIC PANEL  URINALYSIS, ROUTINE W REFLEX MICROSCOPIC   Imaging Review No results found.  MDM  No diagnosis found.  22 y/o female with left temporal headache, n/v, RLQ abdominal pain. Afebrile, vitals normal. Mild RLQ tenderness, no peritonitis.  Presentation and history inconsistent with TOA, PID, ovarian torsion. Also doubt appendicitis. Non-focal neurological exam and HA of gradual onset with nausea and photophobia. Doubt SAH, ICH, carotid/vertebral dissection, intracranial mass. Migraine cocktail given with resolution of HA. Labs remarkable for mild leukocytosis 12.8. Patient unable to urinate and desires discharge as symptoms have resolved. As I have evaluated her for the above mentioned etiologies for her symptoms and believe her to be low risk, discharge is appropriate with PCP f/u or return if symptoms persist or worsen. Return precautions discussed and patient voiced understanding of instructions.   Labs and imaging reviewed in my medical decision making if ordered. Patient discussed with my attending, Dr. Vaughan Basta, MD 08/26/13 [phone]

## 2013-08-25 NOTE — ED Notes (Signed)
Pt reports she feels better and would like to be discharged. MD notified.

## 2013-08-25 NOTE — ED Notes (Signed)
Per EMS pt here c/o chills that started yesterday, one episode of vomiting today with constant nausea. Also now c/o HA. Negative on stroke scale. NSR on monitor. Pt has taken sudafed at home for congestion. Pt c/o 10/10 abdominal pain RLQ, tender to palpation that started after vomiting.

## 2013-08-25 NOTE — ED Notes (Signed)
MD at bedside. 

## 2013-08-25 NOTE — ED Provider Notes (Signed)
22 year old female who complains of both abdominal pain and a headache. On exam the patient has clear heart and lung sounds, normal gait, soft abdomen with no guarding, normal coordination speech and memory. She has normal strength of all 4 extremities. Laboratory evaluation is unremarkable, patient has had resolution of her symptoms with pain medications here, she appears stable for discharge.  I saw and evaluated the patient, reviewed the resident's note and I agree with the findings and plan.  Diagnosis  #1 headache #2 lower abdominal pain    Vida Roller, MD 08/26/13 1624

## 2013-08-26 NOTE — ED Provider Notes (Addendum)
I saw and evaluated the patient, reviewed the resident's note and I agree with the findings and plan.  Please see my separate note regarding my evaluation of the patient.   Vida Roller, MD 08/26/13 1624  Vida Roller, MD 08/26/13 [phone]

## 2013-10-20 ENCOUNTER — Encounter (HOSPITAL_COMMUNITY): Payer: Self-pay | Admitting: Emergency Medicine

## 2013-10-20 ENCOUNTER — Emergency Department (HOSPITAL_COMMUNITY)
Admission: EM | Admit: 2013-10-20 | Discharge: 2013-10-21 | Disposition: A | Discharge status: Home / Self Care | Payer: Medicaid Other | Attending: Emergency Medicine | Admitting: Emergency Medicine

## 2013-10-20 ENCOUNTER — Emergency Department (HOSPITAL_COMMUNITY): Payer: Medicaid Other

## 2013-10-20 DIAGNOSIS — Y939 Activity, unspecified: Secondary | ICD-10-CM | POA: Insufficient documentation

## 2013-10-20 DIAGNOSIS — S29011A Strain of muscle and tendon of front wall of thorax, initial encounter: Secondary | ICD-10-CM

## 2013-10-20 DIAGNOSIS — M25519 Pain in unspecified shoulder: Secondary | ICD-10-CM | POA: Insufficient documentation

## 2013-10-20 DIAGNOSIS — Z87891 Personal history of nicotine dependence: Secondary | ICD-10-CM | POA: Insufficient documentation

## 2013-10-20 DIAGNOSIS — Y929 Unspecified place or not applicable: Secondary | ICD-10-CM | POA: Insufficient documentation

## 2013-10-20 DIAGNOSIS — R05 Cough: Secondary | ICD-10-CM | POA: Insufficient documentation

## 2013-10-20 DIAGNOSIS — M25511 Pain in right shoulder: Secondary | ICD-10-CM

## 2013-10-20 DIAGNOSIS — R059 Cough, unspecified: Secondary | ICD-10-CM | POA: Insufficient documentation

## 2013-10-20 DIAGNOSIS — S2341XA Sprain of ribs, initial encounter: Secondary | ICD-10-CM | POA: Insufficient documentation

## 2013-10-20 DIAGNOSIS — X58XXXA Exposure to other specified factors, initial encounter: Secondary | ICD-10-CM | POA: Insufficient documentation

## 2013-10-20 DIAGNOSIS — R209 Unspecified disturbances of skin sensation: Secondary | ICD-10-CM | POA: Insufficient documentation

## 2013-10-20 NOTE — ED Provider Notes (Addendum)
CSN: [id number]     Arrival date & time 10/20/13  2329 History  This chart was scribed for Earley Favor, NP, working with Olivia Mackie, MD by Blanchard Kelch, ED Scribe. This patient was seen in room WTR7/WTR7 and the patient's care was started at 11:38 PM.   Chief Complaint  Patient presents with  . Shoulder Pain    The history is provided by the patient. No language interpreter was used.    HPI Comments: Maria Sampson is a 22 y.o. female who presents to the Emergency Department complaining of right shoulder pain that began a week ago. She states the pain began in her chest wall right below the shoulder but is now in the shoulder. She has associated intermittent paresthesias in the arm with the pain, She describes the pain as sharp and shooting. The pain is worsened by movement. She was taking Naproxen for the pain without relief, but she ran out. She also has a mild, intermittent cough that began before the pain in the shoulder. 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 She denies any acute injury or trauma to the area that she knows of. She denies fever, shortness of breath, rhinorrhea, sore throat. Her last normal menstrual cycle was last month. She denies any chance that she could be pregnant.   She has a PCP but has not been to him in awhile.   History reviewed. No pertinent past medical history. History reviewed. No pertinent past surgical history. Family History  Problem Relation Age of Onset  . Crohn's disease Other    History  Substance Use Topics  . Smoking status: Former Smoker    Types: Cigarettes    Quit date: 06/25/2013  . Smokeless tobacco: Not on file  . Alcohol Use: Yes     Comment: social   OB History   Grav Para Term Preterm Abortions TAB SAB Ect Mult Living                 Review of Systems  All other systems reviewed and are negative.    Allergies  Review of patient's  allergies indicates no known allergies.  Home Medications   Current Outpatient Rx  Name  Route  Sig  Dispense  Refill  . cyclobenzaprine (FLEXERIL) 5 MG tablet   Oral   Take 1 tablet (5 mg total) by mouth 3 (three) times daily as needed for muscle spasms.   30 tablet   0   . naproxen (NAPROSYN) 500 MG tablet   Oral   Take 1 tablet (500 mg total) by mouth 2 (two) times daily with a meal.   60 tablet   0    Triage Vitals: BP 135/82  Pulse 88  Temp(Src) 97.8 F (36.6 C) (Oral)  Resp 18  SpO2 99%  Physical Exam  Nursing note and vitals reviewed. Constitutional: She appears well-developed and well-nourished.  HENT:  Head: Normocephalic and atraumatic.  Eyes: Pupils are equal, round, and reactive to light.  Cardiovascular: Regular rhythm.   Pulmonary/Chest: Effort normal and breath sounds normal. She exhibits tenderness.  Neurological: She is alert.  Skin: Skin is warm. No rash noted.    ED Course  Procedures (including critical care time)  DIAGNOSTIC STUDIES: Oxygen Saturation is 99% on room air, normal by my interpretation.    COORDINATION OF CARE: 11:44 PM -Will order right shoulder x-ray. Patient verbalizes understanding and agrees with treatment  plan.    Labs Review Labs Reviewed - No data to display Imaging Review Dg Chest 2 View  10/21/2013   CLINICAL DATA:  Mid right rib pain  EXAM: CHEST  2 VIEW  COMPARISON:  01/17/2013  FINDINGS: The heart size and mediastinal contours are within normal limits. Both lungs are clear. The visualized skeletal structures are unremarkable.  IMPRESSION: No active cardiopulmonary disease.   Electronically Signed   By: Jearld Lesch M.D.   On: 10/21/2013 00:17   Dg Cervical Spine Complete  10/21/2013   CLINICAL DATA:  Right shoulder pain for 1 week without trauma  EXAM: CERVICAL SPINE  4+ VIEWS  COMPARISON:  None.  FINDINGS: The lateral view images through the bottom of C7. Prevertebral soft tissues are within normal limits.  Maintenance of vertebral body height. Mild straightening of expected lordosis. Facets are well-aligned. Lateral masses and odontoid process within normal limits.  Cervicothoracic junction not well visualized. Intervertebral disc heights are maintained.  IMPRESSION: Mild straightening of expected cervical lordosis, nonspecific. Otherwise, normal cervical spine.   Electronically Signed   By: Jeronimo Greaves M.D.   On: 10/21/2013 00:24    EKG Interpretation   None       MDM   1. Muscle strain of chest wall, initial encounter   2. Shoulder region pain, right   xray reviewed  Rx for Flexeril and Naprosyn on regular baisis for next 7 days than as needed for muscle strain   I personally performed the services described in this documentation, which was scribed in my presence. The recorded information has been reviewed and is accurate.   Arman Filter, NP 10/21/13 0036  Arman Filter, NP 10/29/13 2233

## 2013-10-20 NOTE — ED Notes (Signed)
Pt c/o R shoulder pain for the past week. Pt denies any injury or trauma to shoulder. Pt states now the pain radiates down her R arm. Pt also states she has some numbness off and on to her R shoulder. ROM intact to shoulder.

## 2013-10-21 MED ORDER — IBUPROFEN 800 MG PO TABS
800.0000 mg | ORAL_TABLET | Freq: Once | ORAL | Status: AC
  Administered 2013-10-21: 800 mg via ORAL
  Filled 2013-10-21: qty 1

## 2013-10-21 MED ORDER — CYCLOBENZAPRINE HCL 10 MG PO TABS
5.0000 mg | ORAL_TABLET | Freq: Once | ORAL | Status: AC
  Administered 2013-10-21: 5 mg via ORAL
  Filled 2013-10-21: qty 1

## 2013-10-21 MED ORDER — NAPROXEN 500 MG PO TABS: 500.0000 mg | ORAL_TABLET | Freq: Two times a day (BID) | ORAL | Status: DC

## 2013-10-21 MED ORDER — CYCLOBENZAPRINE HCL 5 MG PO TABS: 5.0000 mg | ORAL_TABLET | Freq: Three times a day (TID) | ORAL | Status: DC | PRN

## 2013-10-21 NOTE — ED Provider Notes (Signed)
Medical screening examination/treatment/procedure(s) were performed by non-physician practitioner and as supervising physician I was immediately available for consultation/collaboration.    Shaylee Stanislawski M Anntionette Madkins, MD 10/21/13 0547 

## 2013-10-31 NOTE — ED Provider Notes (Signed)
Medical screening examination/treatment/procedure(s) were performed by non-physician practitioner and as supervising physician I was immediately available for consultation/collaboration.    Saharsh Sterling M Maryum Batterson, MD 10/31/13 0807 

## 2013-11-05 ENCOUNTER — Encounter (HOSPITAL_COMMUNITY): Payer: Self-pay | Admitting: Emergency Medicine

## 2013-11-05 ENCOUNTER — Emergency Department (HOSPITAL_COMMUNITY)
Admission: EM | Admit: 2013-11-05 | Discharge: 2013-11-05 | Disposition: A | Discharge status: Home / Self Care | Payer: Medicaid Other | Attending: Emergency Medicine | Admitting: Emergency Medicine

## 2013-11-05 DIAGNOSIS — Z87891 Personal history of nicotine dependence: Secondary | ICD-10-CM | POA: Insufficient documentation

## 2013-11-05 DIAGNOSIS — K089 Disorder of teeth and supporting structures, unspecified: Secondary | ICD-10-CM | POA: Insufficient documentation

## 2013-11-05 DIAGNOSIS — K0889 Other specified disorders of teeth and supporting structures: Secondary | ICD-10-CM

## 2013-11-05 DIAGNOSIS — Z791 Long term (current) use of non-steroidal anti-inflammatories (NSAID): Secondary | ICD-10-CM | POA: Insufficient documentation

## 2013-11-05 MED ORDER — OXYCODONE-ACETAMINOPHEN 5-325 MG PO TABS: 1.0000 | ORAL_TABLET | ORAL | Status: DC | PRN

## 2013-11-05 MED ORDER — IBUPROFEN 800 MG PO TABS: 800.0000 mg | ORAL_TABLET | Freq: Three times a day (TID) | ORAL | Status: DC

## 2013-11-05 MED ORDER — PENICILLIN V POTASSIUM 500 MG PO TABS: 500.0000 mg | ORAL_TABLET | Freq: Four times a day (QID) | ORAL | Status: AC

## 2013-11-05 NOTE — ED Provider Notes (Signed)
CSN: [id number]     Arrival date & time 11/05/13  0130 History   First MD Initiated Contact with Patient 11/05/13 [phone]     Chief Complaint  Patient presents with  . Dental Problem   (Consider location/radiation/quality/duration/timing/severity/associated sxs/prior Treatment) Patient is a 22 y.o. female presenting with tooth pain. The history is provided by the patient. No language interpreter was used.  Dental Pain Toothache location: Left upper dentition. Severity:  Moderate Onset quality:  Gradual Duration:  2 weeks Timing:  Intermittent Progression:  Worsening Chronicity:  New Context: dental caries and poor dentition   Context: not trauma   Relieved by:  Nothing Worsened by:  Touching and pressure Ineffective treatments:  Acetaminophen and NSAIDs Associated symptoms: gum swelling and oral bleeding   Associated symptoms: no difficulty swallowing, no drooling, no facial pain, no facial swelling, no fever, no neck pain, no oral lesions and no trismus   Risk factors: lack of dental care     History reviewed. No pertinent past medical history. History reviewed. No pertinent past surgical history. Family History  Problem Relation Age of Onset  . Crohn's disease Other    History  Substance Use Topics  . Smoking status: Former Smoker    Types: Cigarettes    Quit date: 06/25/2013  . Smokeless tobacco: Not on file  . Alcohol Use: Yes     Comment: social   OB History   Grav Para Term Preterm Abortions TAB SAB Ect Mult Living                 Review of Systems  Constitutional: Negative for fever.  HENT: Positive for dental problem. Negative for drooling, facial swelling and mouth sores.   Musculoskeletal: Negative for neck pain.  All other systems reviewed and are negative.    Allergies  Review of patient's allergies indicates no known allergies.  Home Medications   Current Outpatient Rx  Name  Route  Sig  Dispense  Refill  . acetaminophen (TYLENOL) 500 MG  tablet   Oral   Take 500 mg by mouth every 6 (six) hours as needed.         Marland Kitchen ibuprofen (ADVIL,MOTRIN) 800 MG tablet   Oral   Take 1 tablet (800 mg total) by mouth 3 (three) times daily.   21 tablet   0   . oxyCODONE-acetaminophen (PERCOCET/ROXICET) 5-325 MG per tablet   Oral   Take 1 tablet by mouth every 4 (four) hours as needed for severe pain.   7 tablet   0   . penicillin v potassium (VEETID) 500 MG tablet   Oral   Take 1 tablet (500 mg total) by mouth 4 (four) times daily.   40 tablet   0    BP 142/83  Pulse 84  Temp(Src) 97.7 F (36.5 C) (Oral)  Resp 14  Ht 5\' 9"  (1.753 m)  Wt 195 lb (88.451 kg)  BMI 28.78 kg/m2  SpO2 99%  LMP 10/22/2013  Physical Exam  Nursing note and vitals reviewed. Constitutional: She is oriented to person, place, and time. She appears well-developed and well-nourished. No distress.  HENT:  Head: Normocephalic and atraumatic.  Right Ear: Tympanic membrane, external ear and ear canal normal. No mastoid tenderness.  Left Ear: Tympanic membrane, external ear and ear canal normal. No mastoid tenderness.  Nose: Nose normal.  Mouth/Throat: Uvula is midline, oropharynx is clear and moist and mucous membranes are normal. No oral lesions. No trismus in the jaw. No uvula swelling.  TTP to L upper dentition; premolars. No active oral bleeding appreciated. No area of fluctuance. No trismus or stridor. Patient tolerating secretions without difficulty.  Eyes: Conjunctivae and EOM are normal. Pupils are equal, round, and reactive to light. No scleral icterus.  Neck: Normal range of motion. Neck supple.  Cardiovascular: Normal rate, regular rhythm and normal heart sounds.   Pulmonary/Chest: Effort normal. No stridor. No respiratory distress.  Musculoskeletal: Normal range of motion.  Lymphadenopathy:    She has no cervical adenopathy.  Neurological: She is alert and oriented to person, place, and time.  Skin: Skin is warm and dry. No rash noted. She  is not diaphoretic. No erythema. No pallor.  Psychiatric: She has a normal mood and affect. Her behavior is normal.    ED Course  Procedures (including critical care time) Labs Review Labs Reviewed - No data to display Imaging Review No results found.  EKG Interpretation   None       MDM   1. Dentalgia    Complicated dentalgia. Patient well and nontoxic appearing, hemodynamically stable, and afebrile. No trismus or strider appreciated. No oral bleeding or area of fluctuance appreciated. Patient tolerating secretions without difficulty. Uvula midline without evidence of peritonsillar abscess. No nuchal rigidity or meningismus. No mastoid tenderness. No extensor signs concerning for Ludwig's angina. Patient stable and appropriate for discharge with dental followup. Advised ibuprofen for pain control and will prescribe penicillin to cover for infection. 7 tabs percocet given for breakthrough pain PRN. Return precautions discussed and patient agreeable to plan with no unaddressed concerns.    Antony Madura, PA-C 11/07/13 1932

## 2013-11-05 NOTE — ED Notes (Signed)
Pt c/o tooth/gum pain/swelling bleeding x 2 weeks. Pain worse tonight. No relief with OTC meds.

## 2013-11-06 NOTE — ED Provider Notes (Signed)
Medical screening examination/treatment/procedure(s) were performed by non-physician practitioner and as supervising physician I was immediately available for consultation/collaboration.  Hurman Horn, MD 11/06/13 2033

## 2013-11-08 ENCOUNTER — Encounter (HOSPITAL_COMMUNITY): Payer: Self-pay | Admitting: Emergency Medicine

## 2013-11-08 ENCOUNTER — Emergency Department (HOSPITAL_COMMUNITY)
Admission: EM | Admit: 2013-11-08 | Discharge: 2013-11-08 | Disposition: A | Discharge status: Home / Self Care | Payer: Medicaid Other | Attending: Emergency Medicine | Admitting: Emergency Medicine

## 2013-11-08 ENCOUNTER — Emergency Department (HOSPITAL_COMMUNITY): Payer: Medicaid Other

## 2013-11-08 DIAGNOSIS — N390 Urinary tract infection, site not specified: Secondary | ICD-10-CM | POA: Insufficient documentation

## 2013-11-08 DIAGNOSIS — J069 Acute upper respiratory infection, unspecified: Secondary | ICD-10-CM

## 2013-11-08 DIAGNOSIS — Z3202 Encounter for pregnancy test, result negative: Secondary | ICD-10-CM | POA: Insufficient documentation

## 2013-11-08 DIAGNOSIS — Z87891 Personal history of nicotine dependence: Secondary | ICD-10-CM | POA: Insufficient documentation

## 2013-11-08 DIAGNOSIS — R112 Nausea with vomiting, unspecified: Secondary | ICD-10-CM

## 2013-11-08 DIAGNOSIS — R05 Cough: Secondary | ICD-10-CM | POA: Insufficient documentation

## 2013-11-08 DIAGNOSIS — R059 Cough, unspecified: Secondary | ICD-10-CM | POA: Insufficient documentation

## 2013-11-08 LAB — URINALYSIS, ROUTINE W REFLEX MICROSCOPIC
Bilirubin Urine: NEGATIVE
Ketones, ur: NEGATIVE mg/dL
Leukocytes, UA: NEGATIVE
Nitrite: NEGATIVE
Urobilinogen, UA: 0.2 mg/dL (ref 0.0–1.0)

## 2013-11-08 LAB — URINE MICROSCOPIC-ADD ON

## 2013-11-08 LAB — PREGNANCY, URINE: Preg Test, Ur: NEGATIVE

## 2013-11-08 MED ORDER — ONDANSETRON HCL 4 MG PO TABS
4.0000 mg | ORAL_TABLET | Freq: Once | ORAL | Status: AC
Start: 2013-11-08 — End: 2013-11-08
  Administered 2013-11-08: 4 mg via ORAL
  Filled 2013-11-08: qty 1

## 2013-11-08 MED ORDER — ONDANSETRON HCL 4 MG PO TABS: 4.0000 mg | ORAL_TABLET | Freq: Four times a day (QID) | ORAL | Status: DC

## 2013-11-08 NOTE — ED Provider Notes (Signed)
CSN: [id number]     Arrival date & time 11/08/13  1126 History   First MD Initiated Contact with Patient 11/08/13 1223     Chief Complaint  Patient presents with  . Abdominal Pain   (Consider location/radiation/quality/duration/timing/severity/associated sxs/prior Treatment) Patient is a 22 y.o. female presenting with abdominal pain. The history is provided by the patient. No language interpreter was used.  Abdominal Pain Pain location:  Epigastric Associated symptoms: cough, nausea and vomiting   Associated symptoms: no chest pain, no chills, no constipation, no diarrhea, no dysuria, no fever, no melena and no vaginal discharge   Associated symptoms comment:  She reports cough for the past several days, without fever. No SOB. Last night she started having nausea with vomiting that was not associated with cough. She is vomiting stomach contents but no blood. No diarrhea.    History reviewed. No pertinent past medical history. History reviewed. No pertinent past surgical history. Family History  Problem Relation Age of Onset  . Crohn's disease Other    History  Substance Use Topics  . Smoking status: Former Smoker    Types: Cigarettes    Quit date: 06/25/2013  . Smokeless tobacco: Not on file  . Alcohol Use: Yes     Comment: social   OB History   Grav Para Term Preterm Abortions TAB SAB Ect Mult Living                 Review of Systems  Constitutional: Negative for fever and chills.  HENT: Negative.   Respiratory: Positive for cough.   Cardiovascular: Negative.  Negative for chest pain.  Gastrointestinal: Positive for nausea, vomiting and abdominal pain. Negative for diarrhea, constipation and melena.  Genitourinary: Negative for dysuria and vaginal discharge.  Musculoskeletal: Negative.   Skin: Negative.   Neurological: Negative.     Allergies  Review of patient's allergies indicates no known allergies.  Home Medications   Current Outpatient Rx  Name  Route  Sig   Dispense  Refill  . acetaminophen (TYLENOL) 500 MG tablet   Oral   Take 500 mg by mouth every 6 (six) hours as needed.         Marland Kitchen oxyCODONE-acetaminophen (PERCOCET/ROXICET) 5-325 MG per tablet   Oral   Take 1 tablet by mouth every 4 (four) hours as needed for severe pain.   7 tablet   0   . penicillin v potassium (VEETID) 500 MG tablet   Oral   Take 1 tablet (500 mg total) by mouth 4 (four) times daily.   40 tablet   0    BP 141/90  Pulse 81  Temp(Src) 99 F (37.2 C) (Oral)  Resp 18  SpO2 99%  LMP 10/22/2013 Physical Exam  Constitutional: She is oriented to person, place, and time. She appears well-developed and well-nourished.  HENT:  Head: Normocephalic.  Mouth/Throat: Oropharynx is clear and moist.  Neck: Normal range of motion. Neck supple.  Cardiovascular: Normal rate and regular rhythm.   Pulmonary/Chest: Effort normal and breath sounds normal. She has no wheezes. She has no rales. She exhibits no tenderness.  Abdominal: Soft. Bowel sounds are normal. There is no tenderness. There is no rebound and no guarding.  Musculoskeletal: Normal range of motion.  Neurological: She is alert and oriented to person, place, and time.  Skin: Skin is warm and dry. No rash noted.  Psychiatric: She has a normal mood and affect.    ED Course  Procedures (including critical care time) Labs Review Labs  Reviewed  URINALYSIS, ROUTINE W REFLEX MICROSCOPIC  PREGNANCY, URINE   Results for orders placed during the hospital encounter of 11/08/13  URINALYSIS, ROUTINE W REFLEX MICROSCOPIC      Result Value Range   Color, Urine YELLOW  YELLOW   APPearance CLEAR  CLEAR   Specific Gravity, Urine 1.019  1.005 - 1.030   pH 8.5 (*) 5.0 - 8.0   Glucose, UA NEGATIVE  NEGATIVE mg/dL   Hgb urine dipstick NEGATIVE  NEGATIVE   Bilirubin Urine NEGATIVE  NEGATIVE   Ketones, ur NEGATIVE  NEGATIVE mg/dL   Protein, ur 30 (*) NEGATIVE mg/dL   Urobilinogen, UA 0.2  0.0 - 1.0 mg/dL   Nitrite  NEGATIVE  NEGATIVE   Leukocytes, UA NEGATIVE  NEGATIVE  PREGNANCY, URINE      Result Value Range   Preg Test, Ur NEGATIVE  NEGATIVE  URINE MICROSCOPIC-ADD ON      Result Value Range   Squamous Epithelial / LPF RARE  RARE   Urine-Other MUCOUS PRESENT      Imaging Review No results found.  EKG Interpretation   None       MDM  No diagnosis found. 1. Nausea and vomiting 2. UTI  She is tolerating PO fluids. Abdomen benign on re-examination. Treat likely viral syndrome symptomatically. Stable for discharge.     Arnoldo Hooker, PA-C 11/08/13 1428

## 2013-11-08 NOTE — ED Notes (Signed)
Patient c/o lower abdominal pain x 2 days. Patient stated she has vomited multiple times since yesterday. Patient denies any diarrhea or fever.

## 2013-11-08 NOTE — ED Notes (Signed)
Patient transported to X-ray 

## 2013-11-08 NOTE — ED Notes (Signed)
MD at bedside. 

## 2013-11-09 NOTE — ED Provider Notes (Signed)
Medical screening examination/treatment/procedure(s) were performed by non-physician practitioner and as supervising physician I was immediately available for consultation/collaboration.  Faryal Marxen M Katalena Malveaux, MD 11/09/13 2351 

## 2013-11-10 NOTE — ED Provider Notes (Signed)
Medical screening examination/treatment/procedure(s) were conducted as a shared visit with non-physician practitioner(s) and myself.  I personally evaluated the patient during the encounter.  EKG Interpretation   None       Pt c/o nv, epigastric discomfort. Zofran. Fluids. abd soft nt.     Suzi Roots, MD 11/10/13 [phone]

## 2013-12-26 ENCOUNTER — Emergency Department (HOSPITAL_COMMUNITY)
Admission: EM | Admit: 2013-12-26 | Discharge: 2013-12-26 | Disposition: A | Discharge status: Home / Self Care | Payer: Medicaid Other | Attending: Emergency Medicine | Admitting: Emergency Medicine

## 2013-12-26 ENCOUNTER — Emergency Department (HOSPITAL_COMMUNITY): Payer: Medicaid Other

## 2013-12-26 ENCOUNTER — Encounter (HOSPITAL_COMMUNITY): Payer: Self-pay | Admitting: Emergency Medicine

## 2013-12-26 DIAGNOSIS — Z79899 Other long term (current) drug therapy: Secondary | ICD-10-CM | POA: Insufficient documentation

## 2013-12-26 DIAGNOSIS — Z87891 Personal history of nicotine dependence: Secondary | ICD-10-CM | POA: Insufficient documentation

## 2013-12-26 DIAGNOSIS — R109 Unspecified abdominal pain: Secondary | ICD-10-CM | POA: Insufficient documentation

## 2013-12-26 DIAGNOSIS — R197 Diarrhea, unspecified: Secondary | ICD-10-CM | POA: Insufficient documentation

## 2013-12-26 DIAGNOSIS — Z3202 Encounter for pregnancy test, result negative: Secondary | ICD-10-CM | POA: Insufficient documentation

## 2013-12-26 LAB — POCT PREGNANCY, URINE: PREG TEST UR: NEGATIVE

## 2013-12-26 LAB — URINALYSIS, ROUTINE W REFLEX MICROSCOPIC
Bilirubin Urine: NEGATIVE
GLUCOSE, UA: NEGATIVE mg/dL
Ketones, ur: NEGATIVE mg/dL
Leukocytes, UA: NEGATIVE
Nitrite: NEGATIVE
Protein, ur: NEGATIVE mg/dL
SPECIFIC GRAVITY, URINE: 1.025 (ref 1.005–1.030)
Urobilinogen, UA: 0.2 mg/dL (ref 0.0–1.0)
pH: 6 (ref 5.0–8.0)

## 2013-12-26 LAB — CBC WITH DIFFERENTIAL/PLATELET
Basophils Absolute: 0 10*3/uL (ref 0.0–0.1)
Basophils Relative: 0 % (ref 0–1)
EOS ABS: 0.2 10*3/uL (ref 0.0–0.7)
Eosinophils Relative: 2 % (ref 0–5)
HCT: 37.5 % (ref 36.0–46.0)
Hemoglobin: 13 g/dL (ref 12.0–15.0)
LYMPHS ABS: 1.5 10*3/uL (ref 0.7–4.0)
LYMPHS PCT: 22 % (ref 12–46)
MCH: 31 pg (ref 26.0–34.0)
MCHC: 34.7 g/dL (ref 30.0–36.0)
MCV: 89.3 fL (ref 78.0–100.0)
Monocytes Absolute: 0.5 10*3/uL (ref 0.1–1.0)
Monocytes Relative: 8 % (ref 3–12)
NEUTROS PCT: 68 % (ref 43–77)
Neutro Abs: 4.6 10*3/uL (ref 1.7–7.7)
PLATELETS: 268 10*3/uL (ref 150–400)
RBC: 4.2 MIL/uL (ref 3.87–5.11)
RDW: 12.8 % (ref 11.5–15.5)
WBC: 6.7 10*3/uL (ref 4.0–10.5)

## 2013-12-26 LAB — COMPREHENSIVE METABOLIC PANEL
ALT: 31 U/L (ref 0–35)
AST: 22 U/L (ref 0–37)
Albumin: 3.9 g/dL (ref 3.5–5.2)
Alkaline Phosphatase: 79 U/L (ref 39–117)
BUN: 8 mg/dL (ref 6–23)
CALCIUM: 8.5 mg/dL (ref 8.4–10.5)
CO2: 24 meq/L (ref 19–32)
Chloride: 101 mEq/L (ref 96–112)
Creatinine, Ser: 0.91 mg/dL (ref 0.50–1.10)
GFR calc Af Amer: >90 mL/min (ref >90)
GFR calc non Af Amer: 88 mL/min — ABNORMAL LOW (ref >90)
Glucose, Bld: 104 mg/dL — ABNORMAL HIGH (ref 70–99)
POTASSIUM: 3.6 meq/L — AB (ref 3.7–5.3)
SODIUM: 137 meq/L (ref 137–147)
TOTAL PROTEIN: 6.6 g/dL (ref 6.0–8.3)
Total Bilirubin: 0.5 mg/dL (ref 0.3–1.2)

## 2013-12-26 LAB — URINE MICROSCOPIC-ADD ON

## 2013-12-26 LAB — LIPASE, BLOOD: Lipase: 22 U/L (ref 11–59)

## 2013-12-26 MED ORDER — DICYCLOMINE HCL 20 MG PO TABS: 20.0000 mg | ORAL_TABLET | Freq: Two times a day (BID) | ORAL | Status: DC

## 2013-12-26 MED ORDER — PROMETHAZINE HCL 25 MG PO TABS: 25.0000 mg | ORAL_TABLET | Freq: Four times a day (QID) | ORAL | Status: DC | PRN

## 2013-12-26 MED ORDER — DICYCLOMINE HCL 10 MG PO CAPS
10.0000 mg | ORAL_CAPSULE | Freq: Once | ORAL | Status: AC
  Administered 2013-12-26: 10 mg via ORAL
  Filled 2013-12-26: qty 1

## 2013-12-26 MED ORDER — ONDANSETRON 4 MG PO TBDP
4.0000 mg | ORAL_TABLET | Freq: Once | ORAL | Status: DC
  Filled 2013-12-26: qty 1

## 2013-12-26 NOTE — ED Provider Notes (Signed)
CSN: [id number]     Arrival date & time 12/26/13  0865 History   First MD Initiated Contact with Patient 12/26/13 [phone]     Chief Complaint  Patient presents with  . Abdominal Pain   (Consider location/radiation/quality/duration/timing/severity/associated sxs/prior Treatment) HPI   Patient presents to the ER with complaints of abdominal pain for the past few months but more constant in the past couple of days when she defecates. She says that she is not currently having any pain. She has been seen for abdominal pains in the ER many times and has had 8 CT scans of the abdomen and pelvis all which has been normal. She reports that her grandma and mom have chrons but despite being given many referrals to Gastroenterology she has not felt the need to follow-up. She says that she has been having regular bowel movements with a few episodes of diarrhea, no vomiting, no focal abdominal pains. She denies hematuria or dysuria. She denies being sexually active and has no irregular vaginal bleeding, no discharge, pelvic pain. Denies hx of abdominal surgeries. Pt declines pain medication at this time as she is not hurting currently.   History reviewed. No pertinent past medical history. No past surgical history on file. Family History  Problem Relation Age of Onset  . Crohn's disease Other    History  Substance Use Topics  . Smoking status: Former Smoker    Types: Cigarettes    Quit date: 06/25/2013  . Smokeless tobacco: Not on file  . Alcohol Use: Yes     Comment: social   OB History   Grav Para Term Preterm Abortions TAB SAB Ect Mult Living                 Review of Systems The patient denies anorexia, fever, weight loss,, vision loss, decreased hearing, hoarseness, chest pain, syncope, dyspnea on exertion, peripheral edema, balance deficits, hemoptysis,  melena, hematochezia, severe indigestion/heartburn, hematuria, incontinence, genital sores, muscle weakness, suspicious skin lesions,  transient blindness, difficulty walking, depression, unusual weight change, abnormal bleeding, enlarged lymph nodes, angioedema, and breast masses.  Allergies  Review of patient's allergies indicates no known allergies.  Home Medications   Current Outpatient Rx  Name  Route  Sig  Dispense  Refill  . ibuprofen (ADVIL,MOTRIN) 800 MG tablet   Oral   Take 1,600 mg by mouth every 8 (eight) hours as needed for moderate pain.         Marland Kitchen dicyclomine (BENTYL) 20 MG tablet   Oral   Take 1 tablet (20 mg total) by mouth 2 (two) times daily.   20 tablet   0   . promethazine (PHENERGAN) 25 MG tablet   Oral   Take 1 tablet (25 mg total) by mouth every 6 (six) hours as needed for nausea or vomiting.   30 tablet   0    BP 142/86  Pulse 72  Temp(Src) 98.3 F (36.8 C) (Oral)  SpO2 100%  LMP 12/26/2013 Physical Exam  Nursing note and vitals reviewed. Constitutional: She appears well-developed and well-nourished. No distress.  HENT:  Head: Normocephalic and atraumatic.  Eyes: Pupils are equal, round, and reactive to light.  Neck: Normal range of motion. Neck supple.  Cardiovascular: Normal rate and regular rhythm.   Pulmonary/Chest: Effort normal.  Abdominal: Soft. Bowel sounds are normal. She exhibits no distension. There is no tenderness. There is no rebound, no guarding and no CVA tenderness.  Neurological: She is alert.  Skin: Skin is warm and dry.  ED Course  Procedures (including critical care time) Labs Review Labs Reviewed  COMPREHENSIVE METABOLIC PANEL - Abnormal; Notable for the following:    Potassium 3.6 (*)    Glucose, Bld 104 (*)    GFR calc non Af Amer 88 (*)    All other components within normal limits  URINALYSIS, ROUTINE W REFLEX MICROSCOPIC - Abnormal; Notable for the following:    Hgb urine dipstick TRACE (*)    All other components within normal limits  CBC WITH DIFFERENTIAL  LIPASE, BLOOD  URINE MICROSCOPIC-ADD ON  POCT PREGNANCY, URINE   Imaging  Review Dg Abd 2 Views  12/26/2013   CLINICAL DATA:  Abdominal pain  EXAM: ABDOMEN - 2 VIEW  COMPARISON:  None.  FINDINGS: The bowel gas pattern is normal. There is no evidence of free air. No radio-opaque calculi or other significant radiographic abnormality is seen.  IMPRESSION: No acute abnormality is noted.   Electronically Signed   By: Inez Catalina M.D.   On: 12/26/2013 10:47    EKG Interpretation   None       MDM   1. Abdominal cramping      Labs drawn and Acute abd xray 2 view ordered. PT has not been vomiting or having diarrhea and,currently has no pain therefore IV has not been started. Due to excessive amounts of normal CT scans abd/pelv will    abd is soft, none tender, able to tolerate fluid. Given Bentyl PO in the ED but declined Zofran. Pt urged to follow-up with GI given her significant family hx for Crohn disease. Pt says she will see her grandmas doctor. Pt also given referral to on-call GI.  22 y.o.Ileene Rubens Oros's evaluation in the Emergency Department is complete. It has been determined that no acute conditions requiring further emergency intervention are present at this time. The patient/guardian have been advised of the diagnosis and plan. We have discussed signs and symptoms that warrant return to the ED, such as changes or worsening in symptoms.  Vital signs are stable at discharge. Filed Vitals:   12/26/13 1151  BP: 142/86  Pulse: 72  Temp: 98.3 F (36.8 C)    Patient/guardian has voiced understanding and agreed to follow-up with the PCP or specialist.   Linus Mako, PA-C 12/26/13 1149  Linus Mako, PA-C 12/26/13 1245

## 2013-12-26 NOTE — ED Notes (Signed)
Pt states that she has been having low abd pain x 3 days.  States that the pain has been consistent and has worsening pain when she tries to have a BM.

## 2013-12-26 NOTE — Discharge Instructions (Signed)
Abdominal Pain, Women °Abdominal (stomach, pelvic, or belly) pain can be caused by many things. It is important to tell your doctor: °· The location of the pain. °· Does it come and go or is it present all the time? °· Are there things that start the pain (eating certain foods, exercise)? °· Are there other symptoms associated with the pain (fever, nausea, vomiting, diarrhea)? °All of this is helpful to know when trying to find the cause of the pain. °CAUSES  °· Stomach: virus or bacteria infection, or ulcer. °· Intestine: appendicitis (inflamed appendix), regional ileitis (Crohn's disease), ulcerative colitis (inflamed colon), irritable bowel syndrome, diverticulitis (inflamed diverticulum of the colon), or cancer of the stomach or intestine. °· Gallbladder disease or stones in the gallbladder. °· Kidney disease, kidney stones, or infection. °· Pancreas infection or cancer. °· Fibromyalgia (pain disorder). °· Diseases of the female organs: °· Uterus: fibroid (non-cancerous) tumors or infection. °· Fallopian tubes: infection or tubal pregnancy. °· Ovary: cysts or tumors. °· Pelvic adhesions (scar tissue). °· Endometriosis (uterus lining tissue growing in the pelvis and on the pelvic organs). °· Pelvic congestion syndrome (female organs filling up with blood just before the menstrual period). °· Pain with the menstrual period. °· Pain with ovulation (producing an egg). °· Pain with an IUD (intrauterine device, birth control) in the uterus. °· Cancer of the female organs. °· Functional pain (pain not caused by a disease, may improve without treatment). °· Psychological pain. °· Depression. °DIAGNOSIS  °Your doctor will decide the seriousness of your pain by doing an examination. °· Blood tests. °· X-rays. °· Ultrasound. °· CT scan (computed tomography, special type of X-ray). °· MRI (magnetic resonance imaging). °· Cultures, for infection. °· Barium enema (dye inserted in the large intestine, to better view it with  X-rays). °· Colonoscopy (looking in intestine with a lighted tube). °· Laparoscopy (minor surgery, looking in abdomen with a lighted tube). °· Major abdominal exploratory surgery (looking in abdomen with a large incision). °TREATMENT  °The treatment will depend on the cause of the pain.  °· Many cases can be observed and treated at home. °· Over-the-counter medicines recommended by your caregiver. °· Prescription medicine. °· Antibiotics, for infection. °· Birth control pills, for painful periods or for ovulation pain. °· Hormone treatment, for endometriosis. °· Nerve blocking injections. °· Physical therapy. °· Antidepressants. °· Counseling with a psychologist or psychiatrist. °· Minor or major surgery. °HOME CARE INSTRUCTIONS  °· Do not take laxatives, unless directed by your caregiver. °· Take over-the-counter pain medicine only if ordered by your caregiver. Do not take aspirin because it can cause an upset stomach or bleeding. °· Try a clear liquid diet (broth or water) as ordered by your caregiver. Slowly move to a bland diet, as tolerated, if the pain is related to the stomach or intestine. °· Have a thermometer and take your temperature several times a day, and record it. °· Bed rest and sleep, if it helps the pain. °· Avoid sexual intercourse, if it causes pain. °· Avoid stressful situations. °· Keep your follow-up appointments and tests, as your caregiver orders. °· If the pain does not go away with medicine or surgery, you may try: °· Acupuncture. °· Relaxation exercises (yoga, meditation). °· Group therapy. °· Counseling. °SEEK MEDICAL CARE IF:  °· You notice certain foods cause stomach pain. °· Your home care treatment is not helping your pain. °· You need stronger pain medicine. °· You want your IUD removed. °· You feel faint or   lightheaded. °· You develop nausea and vomiting. °· You develop a rash. °· You are having side effects or an allergy to your medicine. °SEEK IMMEDIATE MEDICAL CARE IF:  °· Your  pain does not go away or gets worse. °· You have a fever. °· Your pain is felt only in portions of the abdomen. The right side could possibly be appendicitis. The left lower portion of the abdomen could be colitis or diverticulitis. °· You are passing blood in your stools (bright red or black tarry stools, with or without vomiting). °· You have blood in your urine. °· You develop chills, with or without a fever. °· You pass out. °MAKE SURE YOU:  °· Understand these instructions. °· Will watch your condition. °· Will get help right away if you are not doing well or get worse. °Document Released: 09/02/2007 Document Revised: 01/28/2012 Document Reviewed: 09/22/2009 °[employer]® Patient Information ©2014 [employer]. ° °

## 2013-12-26 NOTE — ED Notes (Signed)
PA at bedside.

## 2013-12-27 NOTE — ED Provider Notes (Signed)
Medical screening examination/treatment/procedure(s) were performed by non-physician practitioner and as supervising physician I was immediately available for consultation/collaboration.    Kathalene Frames, MD 12/27/13 [phone]

## 2014-04-19 DIAGNOSIS — H6020 Malignant otitis externa, unspecified ear: Secondary | ICD-10-CM

## 2014-04-19 HISTORY — DX: Malignant otitis externa, unspecified ear: H60.20

## 2014-04-30 ENCOUNTER — Emergency Department (HOSPITAL_COMMUNITY)
Admission: EM | Admit: 2014-04-30 | Discharge: 2014-04-30 | Disposition: A | Discharge status: Home / Self Care | Payer: Medicaid Other | Attending: Emergency Medicine | Admitting: Emergency Medicine

## 2014-04-30 ENCOUNTER — Encounter (HOSPITAL_COMMUNITY): Payer: Self-pay | Admitting: Emergency Medicine

## 2014-04-30 DIAGNOSIS — Z87891 Personal history of nicotine dependence: Secondary | ICD-10-CM | POA: Insufficient documentation

## 2014-04-30 DIAGNOSIS — Z792 Long term (current) use of antibiotics: Secondary | ICD-10-CM | POA: Insufficient documentation

## 2014-04-30 DIAGNOSIS — Z79899 Other long term (current) drug therapy: Secondary | ICD-10-CM | POA: Insufficient documentation

## 2014-04-30 DIAGNOSIS — J029 Acute pharyngitis, unspecified: Secondary | ICD-10-CM | POA: Insufficient documentation

## 2014-04-30 DIAGNOSIS — H6690 Otitis media, unspecified, unspecified ear: Secondary | ICD-10-CM | POA: Insufficient documentation

## 2014-04-30 MED ORDER — AMOXICILLIN 500 MG PO CAPS: 500.0000 mg | ORAL_CAPSULE | Freq: Three times a day (TID) | ORAL | Status: DC

## 2014-04-30 MED ORDER — IBUPROFEN 800 MG PO TABS
800.0000 mg | ORAL_TABLET | Freq: Once | ORAL | Status: AC
  Administered 2014-04-30: 800 mg via ORAL

## 2014-04-30 NOTE — ED Provider Notes (Signed)
CSN: [id number]     Arrival date & time 04/30/14  1604 History  This chart was scribed for non-physician practitioner, Glendell Docker, FNP,working with Blanchard Kelch, MD, by Marlowe Kays, ED Scribe.  This patient was seen in room WTR8/WTR8 and the patient's care was started at 4:22 PM.  Chief Complaint  Patient presents with  . Otalgia   The history is provided by the patient. No language interpreter was used.   HPI Comments:  Maria Sampson is a 23 y.o. female who presents to the Emergency Department complaining of severe right ear pain that started two days ago. She states she went swimming a couple of days ago and felt like she had water in it. She reports associated sore throat. She denies fever or chills.   History reviewed. No pertinent past medical history. History reviewed. No pertinent past surgical history. Family History  Problem Relation Age of Onset  . Crohn's disease Other    History  Substance Use Topics  . Smoking status: Former Smoker    Types: Cigarettes    Quit date: 06/25/2013  . Smokeless tobacco: Not on file  . Alcohol Use: Yes     Comment: social   OB History   Grav Para Term Preterm Abortions TAB SAB Ect Mult Living                 Review of Systems  Constitutional: Negative for fever and chills.  HENT: Positive for ear pain and sore throat.   All other systems reviewed and are negative.   Allergies  Review of patient's allergies indicates no known allergies.  Home Medications   Prior to Admission medications   Medication Sig Start Date End Date Taking? Authorizing Provider  amoxicillin (AMOXIL) 500 MG capsule Take 1 capsule (500 mg total) by mouth 3 (three) times daily. 04/30/14   Glendell Docker, NP  dicyclomine (BENTYL) 20 MG tablet Take 1 tablet (20 mg total) by mouth 2 (two) times daily. 12/26/13   Tiffany Marilu Favre, PA-C  ibuprofen (ADVIL,MOTRIN) 800 MG tablet Take 1,600 mg by mouth every 8 (eight) hours as needed for moderate  pain.    Historical Provider, MD  promethazine (PHENERGAN) 25 MG tablet Take 1 tablet (25 mg total) by mouth every 6 (six) hours as needed for nausea or vomiting. 12/26/13   Linus Mako, PA-C   Triage Vitals: BP 140/85  Pulse 86  Temp(Src) 98.7 F (37.1 C) (Oral)  Resp 18  SpO2 99%  LMP 04/16/2014 Physical Exam  Nursing note and vitals reviewed. Constitutional: She is oriented to person, place, and time. She appears well-developed and well-nourished.  HENT:  Head: Normocephalic and atraumatic.  Right Ear: Tympanic membrane is erythematous.  Mouth/Throat: Uvula is midline, oropharynx is clear and moist and mucous membranes are normal.  Eyes: EOM are normal.  Neck: Normal range of motion.  Cardiovascular: Normal rate.   Pulmonary/Chest: Effort normal.  Musculoskeletal: Normal range of motion.  Neurological: She is alert and oriented to person, place, and time.  Skin: Skin is warm and dry.  Psychiatric: She has a normal mood and affect. Her behavior is normal.    ED Course  Procedures (including critical care time) DIAGNOSTIC STUDIES: Oxygen Saturation is 99% on RA, normal by my interpretation.   COORDINATION OF CARE: 4:25 PM- Will treat for ear infection. Pt verbalizes understanding and agrees to plan.  Medications  ibuprofen (ADVIL,MOTRIN) tablet 800 mg (not administered)    Labs Review Labs Reviewed - No  data to display  Imaging Review No results found.   EKG Interpretation None      MDM   Final diagnoses:  Otitis media    Will treat for otitis with amoxicillin  I personally performed the services described in this documentation, which was scribed in my presence. The recorded information has been reviewed and is accurate.    Glendell Docker, NP 04/30/14 1643

## 2014-04-30 NOTE — Discharge Instructions (Signed)
Otitis Media, Adult Otitis media is redness, soreness, and puffiness (swelling) in the space just behind your eardrum (middle ear). It may be caused by allergies or infection. It often happens along with a cold. HOME CARE  Take your medicine as told. Finish it even if you start to feel better.  Only take over-the-counter or prescription medicines for pain, discomfort, or fever as told by your doctor.  Follow up with your doctor as told. GET HELP IF:  You have otitis media only in one ear or bleeding from your nose or both.  You notice a lump on your neck.  You are not getting better in 3 5 days.  You feel worse instead of better. GET HELP RIGHT AWAY IF:   You have pain that is not helped with medicine.  You have puffiness, redness, or pain around your ear.  You get a stiff neck.  You cannot move part of your face (paralysis).  You notice that the bone behind your ear hurts when you touch it. MAKE SURE YOU:   Understand these instructions.  Will watch your condition.  Will get help right away if you are not doing well or get worse. Document Released: 04/23/2008 Document Revised: 07/08/2013 Document Reviewed: 06/02/2013 [facility] Patient Information 2014 [place], [place].

## 2014-04-30 NOTE — ED Provider Notes (Signed)
Medical screening examination/treatment/procedure(s) were performed by non-physician practitioner and as supervising physician I was immediately available for consultation/collaboration.   EKG Interpretation None        Blanchard Kelch, MD 04/30/14 [phone]

## 2014-04-30 NOTE — ED Notes (Signed)
Pt reports having right ear pain for one week. Pt thought she had swimmers ear, however the pain has not improved over the past week. Pt now reports pain in the right side of her face and neck. Pt is A/O x4, in NAD, and vitals are WDL.

## 2014-05-02 ENCOUNTER — Encounter (HOSPITAL_COMMUNITY): Payer: Self-pay | Admitting: Emergency Medicine

## 2014-05-02 ENCOUNTER — Emergency Department (HOSPITAL_COMMUNITY)
Admission: EM | Admit: 2014-05-02 | Discharge: 2014-05-02 | Disposition: A | Discharge status: Home / Self Care | Payer: Medicaid Other | Attending: Emergency Medicine | Admitting: Emergency Medicine

## 2014-05-02 DIAGNOSIS — H6690 Otitis media, unspecified, unspecified ear: Secondary | ICD-10-CM

## 2014-05-02 DIAGNOSIS — Z792 Long term (current) use of antibiotics: Secondary | ICD-10-CM | POA: Insufficient documentation

## 2014-05-02 DIAGNOSIS — Z79899 Other long term (current) drug therapy: Secondary | ICD-10-CM | POA: Insufficient documentation

## 2014-05-02 DIAGNOSIS — Z87891 Personal history of nicotine dependence: Secondary | ICD-10-CM | POA: Insufficient documentation

## 2014-05-02 DIAGNOSIS — H6090 Unspecified otitis externa, unspecified ear: Secondary | ICD-10-CM

## 2014-05-02 DIAGNOSIS — H60399 Other infective otitis externa, unspecified ear: Secondary | ICD-10-CM | POA: Insufficient documentation

## 2014-05-02 MED ORDER — CIPROFLOXACIN-DEXAMETHASONE 0.3-0.1 % OT SUSP
4.0000 [drp] | Freq: Two times a day (BID) | OTIC | Status: DC
  Administered 2014-05-02: 4 [drp] via OTIC
  Filled 2014-05-02: qty 7.5

## 2014-05-02 MED ORDER — HYDROCODONE-ACETAMINOPHEN 5-325 MG PO TABS: 1.0000 | ORAL_TABLET | Freq: Four times a day (QID) | ORAL | Status: DC | PRN

## 2014-05-02 NOTE — Discharge Instructions (Signed)
Ear Drops, Adult You need to put eardrops in your ear. HOME CARE   Put drops in your affected ear as told.  After putting in the drops, lay down with the ear you put the drops in facing up. Do this for 10 minutes. Use the ear drops as long as your doctor tells you.  Before you get up, put a cotton ball gently in your ear. Do not push it far in your ear.  Do not wash out your ears unless your doctor says it is okay.  Finish all medicines as told by your doctor. You may be told to keep using the eardrops even if you start to feel better.  See your doctor as told for follow-up visits. GET HELP IF:  You have pain that gets worse.  Any unusual fluid (drainage) is coming from your ear (especially if the fluid stinks).  You have trouble hearing.  You get really dizzy as if the room is spinning and feel sick to your stomach (vertigo).  The outside of your ear becomes red or puffy or both. This may be a sign of an allergic reaction. MAKE SURE YOU:   Understand these instructions.  Will watch your condition.  Will get help right away if you are not doing well or get worse. Document Released: 04/25/2010 Document Revised: 07/08/2013 Document Reviewed: 06/02/2013 [facility] Patient Information 2014 [place], [place].

## 2014-05-02 NOTE — ED Provider Notes (Signed)
CSN: [id number]     Arrival date & time 05/02/14  1319 History  This chart was scribed for non-physician practitioner, Glendell Docker, NP working with Hoy Morn, MD by Frederich Balding, ED scribe. This patient was seen in room WTR5/WTR5 and the patient's care was started at 2:00 PM.   Chief Complaint  Patient presents with  . Otalgia   The history is provided by the patient. No language interpreter was used.   HPI Comments: Maria Sampson is a 23 y.o. female who presents to the Emergency Department complaining of gradual onset right ear pain that started 4 days ago. Pt was seen for the same 2 days ago and discharged home with amoxicillin. She has been taking it as prescribed but states that pain now radiates into her jaw and behind her ear. Also reports some hearing loss. Pt has taken tylenol with no relief.   History reviewed. No pertinent past medical history. History reviewed. No pertinent past surgical history. Family History  Problem Relation Age of Onset  . Crohn's disease Other    History  Substance Use Topics  . Smoking status: Former Smoker    Types: Cigarettes    Quit date: 06/25/2013  . Smokeless tobacco: Not on file  . Alcohol Use: Yes     Comment: social   OB History   Grav Para Term Preterm Abortions TAB SAB Ect Mult Living                 Review of Systems  HENT: Positive for ear pain and hearing loss.   All other systems reviewed and are negative.  Allergies  Review of patient's allergies indicates no known allergies.  Home Medications   Prior to Admission medications   Medication Sig Start Date End Date Taking? Authorizing Provider  amoxicillin (AMOXIL) 500 MG capsule Take 1 capsule (500 mg total) by mouth 3 (three) times daily. 04/30/14   Glendell Docker, NP  dicyclomine (BENTYL) 20 MG tablet Take 1 tablet (20 mg total) by mouth 2 (two) times daily. 12/26/13   Tiffany Marilu Favre, PA-C  ibuprofen (ADVIL,MOTRIN) 800 MG tablet Take 1,600 mg by mouth every  8 (eight) hours as needed for moderate pain.    Historical Provider, MD  promethazine (PHENERGAN) 25 MG tablet Take 1 tablet (25 mg total) by mouth every 6 (six) hours as needed for nausea or vomiting. 12/26/13   Tiffany Marilu Favre, PA-C   BP 147/88  Pulse 96  Temp(Src) 98.7 F (37.1 C) (Oral)  Resp 16  SpO2 97%  LMP 04/16/2014  Physical Exam  Nursing note and vitals reviewed. Constitutional: She is oriented to person, place, and time. She appears well-developed and well-nourished. No distress.  HENT:  Head: Normocephalic and atraumatic.  Right Ear: There is drainage and tenderness. Tympanic membrane is injected and erythematous. A middle ear effusion is present.  Left Ear: External ear normal.  Mouth/Throat: Oropharynx is clear and moist.  Eyes: Conjunctivae and EOM are normal.  Neck: Normal range of motion. No tracheal deviation present.  Cardiovascular: Normal rate.   Pulmonary/Chest: Effort normal. No respiratory distress.  Musculoskeletal: Normal range of motion.  Neurological: She is alert and oriented to person, place, and time.  Skin: Skin is warm and dry.  Psychiatric: She has a normal mood and affect. Her behavior is normal.    ED Course  Procedures (including critical care time)  DIAGNOSTIC STUDIES: Oxygen Saturation is 97% on RA, normal by my interpretation.    COORDINATION OF  CARE: 2:01 PM-Discussed treatment plan which includes ear drops and pain medication with pt at bedside and pt agreed to plan. Will give pt ENT referral and advised her to follow up if symptoms do not resolve.   Labs Review Labs Reviewed - No data to display  Imaging Review No results found.   EKG Interpretation None      MDM   Final diagnoses:  Otitis media  Otitis externa     Will treat with pain medication and otic drops. Pt given ent referral    I personally performed the services described in this documentation, which was scribed in my presence. The recorded information has  been reviewed and is accurate.  Glendell Docker, NP 05/02/14 1409

## 2014-05-02 NOTE — ED Provider Notes (Signed)
Medical screening examination/treatment/procedure(s) were performed by non-physician practitioner and as supervising physician I was immediately available for consultation/collaboration.   EKG Interpretation None        Hoy Morn, MD 05/02/14 1510

## 2014-05-02 NOTE — ED Notes (Signed)
Pt seen in ED on 6/12 for same complaint. Right ear pain. Pain 10/10. Pt reports she has been taking amoxicillin as prescribed, but ear is getting worse and now she cannot hear out of right ear.

## 2014-05-03 ENCOUNTER — Emergency Department (HOSPITAL_COMMUNITY)
Admission: EM | Admit: 2014-05-03 | Discharge: 2014-05-03 | Discharge status: Against Medical Advice | Payer: Medicaid Other

## 2014-05-03 ENCOUNTER — Encounter (HOSPITAL_COMMUNITY): Payer: Self-pay | Admitting: Emergency Medicine

## 2014-05-03 ENCOUNTER — Encounter: Payer: Self-pay | Admitting: Otolaryngology

## 2014-05-03 ENCOUNTER — Inpatient Hospital Stay (HOSPITAL_COMMUNITY)
Admission: AD | Admit: 2014-05-03 | Discharge: 2014-05-06 | DRG: 156 | Disposition: A | Discharge status: Home / Self Care | Payer: Medicaid Other | Source: Ambulatory Visit | Attending: Otolaryngology | Admitting: Otolaryngology

## 2014-05-03 ENCOUNTER — Encounter (HOSPITAL_COMMUNITY): Payer: Self-pay | Admitting: General Practice

## 2014-05-03 ENCOUNTER — Emergency Department (HOSPITAL_COMMUNITY)
Admission: EM | Admit: 2014-05-03 | Discharge: 2014-05-03 | Disposition: A | Discharge status: Home / Self Care | Payer: Self-pay | Source: Home / Self Care | Attending: Emergency Medicine | Admitting: Emergency Medicine

## 2014-05-03 ENCOUNTER — Emergency Department (HOSPITAL_COMMUNITY): Payer: Self-pay

## 2014-05-03 DIAGNOSIS — H6691 Otitis media, unspecified, right ear: Secondary | ICD-10-CM

## 2014-05-03 DIAGNOSIS — H8309 Labyrinthitis, unspecified ear: Secondary | ICD-10-CM

## 2014-05-03 DIAGNOSIS — B965 Pseudomonas (aeruginosa) (mallei) (pseudomallei) as the cause of diseases classified elsewhere: Secondary | ICD-10-CM | POA: Diagnosis present

## 2014-05-03 DIAGNOSIS — H60399 Other infective otitis externa, unspecified ear: Principal | ICD-10-CM | POA: Diagnosis present

## 2014-05-03 DIAGNOSIS — H6020 Malignant otitis externa, unspecified ear: Secondary | ICD-10-CM | POA: Diagnosis present

## 2014-05-03 HISTORY — DX: Malignant otitis externa, unspecified ear: H60.20

## 2014-05-03 LAB — CBC WITH DIFFERENTIAL/PLATELET
BASOS ABS: 0 10*3/uL (ref 0.0–0.1)
BASOS PCT: 0 % (ref 0–1)
EOS PCT: 2 % (ref 0–5)
Eosinophils Absolute: 0.2 10*3/uL (ref 0.0–0.7)
HCT: 38.3 % (ref 36.0–46.0)
Hemoglobin: 13.3 g/dL (ref 12.0–15.0)
LYMPHS PCT: 21 % (ref 12–46)
Lymphs Abs: 2.3 10*3/uL (ref 0.7–4.0)
MCH: 30.6 pg (ref 26.0–34.0)
MCHC: 34.7 g/dL (ref 30.0–36.0)
MCV: 88 fL (ref 78.0–100.0)
Monocytes Absolute: 0.8 10*3/uL (ref 0.1–1.0)
Monocytes Relative: 7 % (ref 3–12)
Neutro Abs: 7.7 10*3/uL (ref 1.7–7.7)
Neutrophils Relative %: 70 % (ref 43–77)
PLATELETS: 271 10*3/uL (ref 150–400)
RBC: 4.35 MIL/uL (ref 3.87–5.11)
RDW: 12.6 % (ref 11.5–15.5)
WBC: 10.9 10*3/uL — ABNORMAL HIGH (ref 4.0–10.5)

## 2014-05-03 LAB — PREGNANCY, URINE: PREG TEST UR: NEGATIVE

## 2014-05-03 LAB — BASIC METABOLIC PANEL
BUN: 14 mg/dL (ref 6–23)
CALCIUM: 9 mg/dL (ref 8.4–10.5)
CO2: 24 meq/L (ref 19–32)
CREATININE: 1.13 mg/dL — AB (ref 0.50–1.10)
Chloride: 101 mEq/L (ref 96–112)
GFR calc non Af Amer: 67 mL/min — ABNORMAL LOW (ref >90)
GFR, EST AFRICAN AMERICAN: 78 mL/min — AB (ref >90)
Glucose, Bld: 93 mg/dL (ref 70–99)
Potassium: 3.7 mEq/L (ref 3.7–5.3)
SODIUM: 136 meq/L — AB (ref 137–147)

## 2014-05-03 MED ORDER — OXYCODONE-ACETAMINOPHEN 5-325 MG PO TABS: 2.0000 | ORAL_TABLET | ORAL | Status: DC | PRN

## 2014-05-03 MED ORDER — ANTIPYRINE-BENZOCAINE 5.4-1.4 % OT SOLN: 3.0000 [drp] | OTIC | Status: DC | PRN

## 2014-05-03 MED ORDER — HYDROMORPHONE HCL PF 1 MG/ML IJ SOLN
1.0000 mg | Freq: Once | INTRAMUSCULAR | Status: AC
  Administered 2014-05-03: 1 mg via INTRAVENOUS
  Filled 2014-05-03: qty 1

## 2014-05-03 MED ORDER — CIPROFLOXACIN-DEXAMETHASONE 0.3-0.1 % OT SUSP
4.0000 [drp] | Freq: Two times a day (BID) | OTIC | Status: DC
  Administered 2014-05-03 – 2014-05-06 (×7): 4 [drp] via OTIC
  Filled 2014-05-03: qty 7.5

## 2014-05-03 MED ORDER — MORPHINE SULFATE 2 MG/ML IJ SOLN
2.0000 mg | INTRAMUSCULAR | Status: DC | PRN
  Administered 2014-05-03 – 2014-05-05 (×6): 2 mg via INTRAVENOUS
  Filled 2014-05-03 (×6): qty 1

## 2014-05-03 MED ORDER — PIPERACILLIN-TAZOBACTAM 3.375 G IVPB
3.3750 g | Freq: Three times a day (TID) | INTRAVENOUS | Status: DC
  Administered 2014-05-03 – 2014-05-05 (×8): 3.375 g via INTRAVENOUS
  Filled 2014-05-03 (×11): qty 50

## 2014-05-03 MED ORDER — ANTIPYRINE-BENZOCAINE 5.4-1.4 % OT SOLN
3.0000 [drp] | OTIC | Status: DC | PRN
  Administered 2014-05-03: 4 [drp] via OTIC
  Filled 2014-05-03: qty 10

## 2014-05-03 MED ORDER — ONDANSETRON HCL 4 MG/2ML IJ SOLN: 4.0000 mg | Freq: Four times a day (QID) | INTRAMUSCULAR | Status: DC | PRN

## 2014-05-03 MED ORDER — AMOXICILLIN-POT CLAVULANATE 875-125 MG PO TABS: 1.0000 | ORAL_TABLET | Freq: Two times a day (BID) | ORAL | Status: DC

## 2014-05-03 MED ORDER — ONDANSETRON 8 MG PO TBDP: 8.0000 mg | ORAL_TABLET | Freq: Three times a day (TID) | ORAL | Status: DC | PRN

## 2014-05-03 MED ORDER — SODIUM CHLORIDE 0.9 % IV SOLN
3.0000 g | Freq: Once | INTRAVENOUS | Status: AC
  Administered 2014-05-03: 3 g via INTRAVENOUS
  Filled 2014-05-03: qty 3

## 2014-05-03 MED ORDER — OXYCODONE HCL 5 MG PO TABS
5.0000 mg | ORAL_TABLET | ORAL | Status: DC | PRN
  Administered 2014-05-03 – 2014-05-06 (×10): 5 mg via ORAL
  Filled 2014-05-03 (×10): qty 1

## 2014-05-03 MED ORDER — ONDANSETRON HCL 4 MG/2ML IJ SOLN
4.0000 mg | Freq: Once | INTRAMUSCULAR | Status: AC
  Administered 2014-05-03: 4 mg via INTRAVENOUS
  Filled 2014-05-03: qty 2

## 2014-05-03 MED ORDER — IOHEXOL 300 MG/ML  SOLN
100.0000 mL | Freq: Once | INTRAMUSCULAR | Status: AC | PRN
  Administered 2014-05-03: 100 mL via INTRAVENOUS

## 2014-05-03 NOTE — ED Notes (Signed)
Pt arrived to the ED with a complaint of ear pain.  Pt seen here for same twice before.  Pt states all prescriptions given are not helping.

## 2014-05-03 NOTE — Discharge Instructions (Signed)
Please contact ENT today for close followup for your ear infection.  No signs of mastoiditis were seen on the scan today.  Change your antibiotic to Augmentin from Amoxicillin.  Continue the ear drops.     Otitis Media, Adult Otitis media is redness, soreness, and swelling (inflammation) of the middle ear. Otitis media may be caused by allergies or, most commonly, by infection. Often it occurs as a complication of the common cold. SIGNS AND SYMPTOMS Symptoms of otitis media may include:  Earache.  Fever.  Ringing in your ear.  Headache.  Leakage of fluid from the ear. DIAGNOSIS To diagnose otitis media, your health care provider will examine your ear with an otoscope. This is an instrument that allows your health care provider to see into your ear in order to examine your eardrum. Your health care provider also will ask you questions about your symptoms. TREATMENT  Typically, otitis media resolves on its own within 3 5 days. Your health care provider may prescribe medicine to ease your symptoms of pain. If otitis media does not resolve within 5 days or is recurrent, your health care provider may prescribe antibiotic medicines if he or she suspects that a bacterial infection is the cause. HOME CARE INSTRUCTIONS   Take your medicine as directed until it is gone, even if you feel better after the first few days.  Only take over-the-counter or prescription medicines for pain, discomfort, or fever as directed by your health care provider.  Follow up with your health care provider as directed. SEEK MEDICAL CARE IF:  You have otitis media only in one ear or bleeding from your nose or both.  You notice a lump on your neck.  You are not getting better in 3 5 days.  You feel worse instead of better. SEEK IMMEDIATE MEDICAL CARE IF:   You have pain that is not controlled with medicine.  You have swelling, redness, or pain around your ear or stiffness in your neck.  You notice that part  of your face is paralyzed.  You notice that the bone behind your ear (mastoid) is tender when you touch it. MAKE SURE YOU:   Understand these instructions.  Will watch your condition.  Will get help right away if you are not doing well or get worse. Document Released: 08/10/2004 Document Revised: 08/26/2013 Document Reviewed: 06/02/2013 [facility] Patient Information 2014 [place], [place].

## 2014-05-03 NOTE — ED Notes (Signed)
Pt at CT

## 2014-05-03 NOTE — H&P (Signed)
Maria Sampson is an 23 y.o. female.   Chief Complaint: right ear pain  HPI: Hx of 1 week of pain in the right ear. Initially no hearing loss. Treated with amox and augmentin. Ct scn with canal swelling and OM. VII nerve intact. No diabetes. No previous hx. Treated with few doses of IV abx and drops. Still having sigificant pain.  No past medical history on file.  No past surgical history on file.  Family History  Problem Relation Age of Onset  . Crohn's disease Other    Social History:  reports that she quit smoking about 10 months ago. Her smoking use included Cigarettes. She smoked 0.00 packs per day. She does not have any smokeless tobacco history on file. She reports that she drinks alcohol. She reports that she does not use illicit drugs.  Allergies:  Allergies  Allergen Reactions  . Food Hives, Itching and Swelling    PEANUTS     (Not in a hospital admission)  Results for orders placed during the hospital encounter of 05/03/14 (from the past 48 hour(s))  CBC WITH DIFFERENTIAL     Status: Abnormal   Collection Time    05/03/14  1:41 AM      Result Value Ref Range   WBC 10.9 (*) 4.0 - 10.5 K/uL   RBC 4.35  3.87 - 5.11 MIL/uL   Hemoglobin 13.3  12.0 - 15.0 g/dL   HCT 38.3  36.0 - 46.0 %   MCV 88.0  78.0 - 100.0 fL   MCH 30.6  26.0 - 34.0 pg   MCHC 34.7  30.0 - 36.0 g/dL   RDW 12.6  11.5 - 15.5 %   Platelets 271  150 - 400 K/uL   Neutrophils Relative % 70  43 - 77 %   Neutro Abs 7.7  1.7 - 7.7 K/uL   Lymphocytes Relative 21  12 - 46 %   Lymphs Abs 2.3  0.7 - 4.0 K/uL   Monocytes Relative 7  3 - 12 %   Monocytes Absolute 0.8  0.1 - 1.0 K/uL   Eosinophils Relative 2  0 - 5 %   Eosinophils Absolute 0.2  0.0 - 0.7 K/uL   Basophils Relative 0  0 - 1 %   Basophils Absolute 0.0  0.0 - 0.1 K/uL  BASIC METABOLIC PANEL     Status: Abnormal   Collection Time    05/03/14  1:41 AM      Result Value Ref Range   Sodium 136 (*) 137 - 147 mEq/L   Potassium 3.7  3.7 - 5.3  mEq/L   Chloride 101  96 - 112 mEq/L   CO2 24  19 - 32 mEq/L   Glucose, Bld 93  70 - 99 mg/dL   BUN 14  6 - 23 mg/dL   Creatinine, Ser 1.13 (*) 0.50 - 1.10 mg/dL   Calcium 9.0  8.4 - 10.5 mg/dL   GFR calc non Af Amer 67 (*) >90 mL/min   GFR calc Af Amer 78 (*) >90 mL/min   Comment: (NOTE)     The eGFR has been calculated using the CKD EPI equation.     This calculation has not been validated in all clinical situations.     eGFR's persistently <90 mL/min signify possible Chronic Kidney     Disease.  PREGNANCY, URINE     Status: None   Collection Time    05/03/14  2:05 AM      Result Value  Ref Range   Preg Test, Ur NEGATIVE  NEGATIVE   Comment:            THE SENSITIVITY OF THIS     METHODOLOGY IS >20 mIU/mL.   Ct Maxillofacial W/cm  05/03/2014   CLINICAL DATA:  Pain about the right ear and mastoid region. Mild leukocytosis.  EXAM: CT MAXILLOFACIAL WITH CONTRAST  TECHNIQUE: Multidetector CT imaging of the maxillofacial structures was performed with intravenous contrast. Multiplanar CT image reconstructions were also generated. A small metallic BB was placed on the right temple in order to reliably differentiate right from left.  CONTRAST:  184m OMNIPAQUE IOHEXOL 300 MG/ML  SOLN  COMPARISON:  CT of the head performed 08/01/2011  FINDINGS: There is complete opacification of the right external auditory canal, extending deep to the expected location of the tympanic membrane. Minimal mucosal thickening is seen about the ossicles and tracking along the right eustachian tube, concerning for otitis media and mild otitis interna. There is no evidence of osseous erosion at this time. The right mastoid air cells appear well-aerated. Remaining inner ear structures are grossly unremarkable.  A few associated prominent nodes are seen, anterior to the right external auditory canal. The left external auditory canal is unremarkable in appearance. The left mastoid air cells are also well aerated. The visualized  paranasal sinuses are clear.  There is no evidence of fracture or dislocation. The maxilla and mandible appear intact. The nasal bone is unremarkable in appearance. The visualized dentition demonstrates no acute abnormality.  The orbits are intact bilaterally. The visualized portions of the brain are unremarkable in appearance. A few tonsilloliths are seen within the right palatine tonsil.  No additional soft tissue abnormalities are seen. The parapharyngeal fat planes are preserved. The nasopharynx, oropharynx and hypopharynx are unremarkable in appearance. The visualized portions of the valleculae and piriform sinuses are grossly unremarkable.  The parotid and submandibular glands are within normal limits. No cervical lymphadenopathy is seen.  IMPRESSION: 1. Complete opacification of the right external auditory canal, extending deep to the expected location of the tympanic membrane. Minimal mucosal thickening noted about the right ossicles and tracking along the right eustachian tube. Findings are concerning for otitis media and mild otitis interna. No evidence of osseous erosion at this time. The right mastoid air cells remain well-aerated. 2. Few mildly prominent associated nodes seen anterior to the right external auditory canal. 3. Few tonsilloliths incidentally noted within the right palatine tonsil.   Electronically Signed   By: JGarald BaldingM.D.   On: 05/03/2014 03:05    Review of Systems  Constitutional: Negative.   HENT: Positive for ear discharge, ear pain and hearing loss.   Eyes: Negative.   Respiratory: Negative.   Cardiovascular: Negative.   Gastrointestinal: Negative.   Skin: Negative.     Last menstrual period 04/16/2014. Physical Exam  Constitutional: She appears well-developed and well-nourished.  HENT:  Head: Normocephalic.  Mouth/Throat: Oropharynx is clear and moist.  The right ear canalhas significant swelling  Eyes: Conjunctivae are normal. Pupils are equal, round, and  reactive to light.  Neck: Normal range of motion. Neck supple.  Cardiovascular: Normal rate.   Respiratory: Effort normal.  GI: Soft.  Musculoskeletal: Normal range of motion.     Assessment/Plan Right OM and OE- she cannot afford the outpatient meds and will admit for pain control and IV antibiotic meds.  BMelissa Montane6/15/2015, 11:17 AM

## 2014-05-03 NOTE — ED Provider Notes (Signed)
CSN: [id number]     Arrival date & time 05/03/14  0032 History   First MD Initiated Contact with Patient 05/03/14 0103     Chief Complaint  Patient presents with  . Otalgia     (Consider location/radiation/quality/duration/timing/severity/associated sxs/prior Treatment) HPI 23 year old female presents to emergency room with complaint of persistent and worsening right ear pain.  She reports symptoms started 4 days ago.  She was seen in the emergency room on the 12th diagnosed with otitis media and started on amoxicillin.  Patient returned earlier today with persistent and worsening pain.  She was started on ciprofloxacin drops and Norco.  She reports the pain worsened with the eardrops in the Norco has not been touching it.  She reports she had a temp of 100.3 2 days ago, has not recently had fever.  She reports worsening pain and swelling to the area behind her right ear as well as pain along her right jaw.  She reports she's she has difficulty opening and closing her mouth. History reviewed. No pertinent past medical history. History reviewed. No pertinent past surgical history. Family History  Problem Relation Age of Onset  . Crohn's disease Other    History  Substance Use Topics  . Smoking status: Former Smoker    Types: Cigarettes    Quit date: 06/25/2013  . Smokeless tobacco: Not on file  . Alcohol Use: Yes     Comment: social   OB History   Grav Para Term Preterm Abortions TAB SAB Ect Mult Living                 Review of Systems  See History of Present Illness; otherwise all other systems are reviewed and negative   Allergies  Food  Home Medications   Prior to Admission medications   Medication Sig Start Date End Date Taking? Authorizing Provider  amoxicillin (AMOXIL) 500 MG capsule Take 500 mg by mouth 3 (three) times daily. 10 DAY THERAPY COURSE PATIENT COMPLETED DAY 3. 04/30/14  Yes Glendell Docker, NP  ciprofloxacin-dexamethasone (CIPRODEX) otic suspension Place  4 drops into the right ear 2 (two) times daily.   Yes Historical Provider, MD  HYDROcodone-acetaminophen (NORCO/VICODIN) 5-325 MG per tablet Take 1 tablet by mouth every 6 (six) hours as needed for moderate pain.   Yes Historical Provider, MD   BP 160/96  Pulse 89  Temp(Src) 98.5 F (36.9 C) (Oral)  Resp 18  SpO2 100%  LMP 04/16/2014 Physical Exam  Nursing note and vitals reviewed. Constitutional: She appears distressed (uncomfortable appearing).  HENT:  Nose: Nose normal.  Mouth/Throat: Oropharynx is clear and moist. No oropharyngeal exudate.  Right ear canal is swollen.  TM is bulging and dull.  Patient has significant tenderness over her mastoid as well is lymphadenopathy along her right anterior cervical chain and submental nodes.  Eyes: Conjunctivae and EOM are normal. Pupils are equal, round, and reactive to light.  Neck: Normal range of motion. Neck supple. JVD present. No tracheal deviation present. No thyromegaly present.  Cardiovascular: Normal rate, regular rhythm, normal heart sounds and intact distal pulses.  Exam reveals no gallop and no friction rub.   No murmur heard. Pulmonary/Chest: Effort normal and breath sounds normal. No stridor. No respiratory distress. She has no wheezes. She has no rales. She exhibits no tenderness.  Abdominal: Soft. Bowel sounds are normal. She exhibits no distension and no mass. There is no tenderness. There is no rebound and no guarding.  Musculoskeletal: Normal range of motion. She exhibits  no edema and no tenderness.  Lymphadenopathy:    She has no cervical adenopathy.  Neurological: She is alert. No cranial nerve deficit. She exhibits normal muscle tone. Coordination normal.  Skin: Skin is warm and dry. No rash noted. No erythema. No pallor.    ED Course  Procedures (including critical care time) Labs Review Labs Reviewed  CBC WITH DIFFERENTIAL - Abnormal; Notable for the following:    WBC 10.9 (*)    All other components within  normal limits  BASIC METABOLIC PANEL - Abnormal; Notable for the following:    Sodium 136 (*)    Creatinine, Ser 1.13 (*)    GFR calc non Af Amer 67 (*)    GFR calc Af Amer 78 (*)    All other components within normal limits  PREGNANCY, URINE    Imaging Review Ct Maxillofacial W/cm  05/03/2014   CLINICAL DATA:  Pain about the right ear and mastoid region. Mild leukocytosis.  EXAM: CT MAXILLOFACIAL WITH CONTRAST  TECHNIQUE: Multidetector CT imaging of the maxillofacial structures was performed with intravenous contrast. Multiplanar CT image reconstructions were also generated. A small metallic BB was placed on the right temple in order to reliably differentiate right from left.  CONTRAST:  153mL OMNIPAQUE IOHEXOL 300 MG/ML  SOLN  COMPARISON:  CT of the head performed 08/01/2011  FINDINGS: There is complete opacification of the right external auditory canal, extending deep to the expected location of the tympanic membrane. Minimal mucosal thickening is seen about the ossicles and tracking along the right eustachian tube, concerning for otitis media and mild otitis interna. There is no evidence of osseous erosion at this time. The right mastoid air cells appear well-aerated. Remaining inner ear structures are grossly unremarkable.  A few associated prominent nodes are seen, anterior to the right external auditory canal. The left external auditory canal is unremarkable in appearance. The left mastoid air cells are also well aerated. The visualized paranasal sinuses are clear.  There is no evidence of fracture or dislocation. The maxilla and mandible appear intact. The nasal bone is unremarkable in appearance. The visualized dentition demonstrates no acute abnormality.  The orbits are intact bilaterally. The visualized portions of the brain are unremarkable in appearance. A few tonsilloliths are seen within the right palatine tonsil.  No additional soft tissue abnormalities are seen. The parapharyngeal fat  planes are preserved. The nasopharynx, oropharynx and hypopharynx are unremarkable in appearance. The visualized portions of the valleculae and piriform sinuses are grossly unremarkable.  The parotid and submandibular glands are within normal limits. No cervical lymphadenopathy is seen.  IMPRESSION: 1. Complete opacification of the right external auditory canal, extending deep to the expected location of the tympanic membrane. Minimal mucosal thickening noted about the right ossicles and tracking along the right eustachian tube. Findings are concerning for otitis media and mild otitis interna. No evidence of osseous erosion at this time. The right mastoid air cells remain well-aerated. 2. Few mildly prominent associated nodes seen anterior to the right external auditory canal. 3. Few tonsilloliths incidentally noted within the right palatine tonsil.   Electronically Signed   By: Garald Balding M.D.   On: 05/03/2014 03:05     EKG Interpretation None      MDM   Final diagnoses:  Otitis media of right ear  Otitis interna    23 year old female with worsening right ear pain.  I'm concerned about possible mastoiditis and she does not appear to be responding to the oral antibiotics.  Will  give dose of Unasyn, check labs, get CT.  Ct scan shows otitis media, mild interna.  She has received IV abx.  Pt to f/u with ENT today/tomorrow for recheck.      Kalman Drape, MD 05/03/14 [phone]

## 2014-05-03 NOTE — ED Provider Notes (Signed)
Patient here with persistent right ear pain. Discharged 4 hours ago. Patient was referred to ENT on call but cannot be seen in the office due to financial reasons. I spoke with Dr. Janace Hoard, the ENT doctor on call, and have arranged for her to be seen today in the office. Despite the financial barriers  Maria Jacobsen, MD 05/03/14 [phone]

## 2014-05-03 NOTE — ED Notes (Signed)
As Probation officer entered patient room to [employer], patient questioned what was she waiting on. Patient pain evaluated and said it was getting better and was currently a 7/10. Patient then stated "there's blood coming out of my ear", scant amount of drainage noted. Patient was given Zofran and as medication was being pushed patient stated the pain in her ear was "really intensifying" and was now an 8/10. Advised patient I would follow up with MD on further care plan and to update MD on patient current status.

## 2014-05-04 NOTE — Progress Notes (Signed)
Subjective: She says it still hurts significantly. She is not able to hear well. No facial weakness.   Objective: Vital signs in last 24 hours: Temp:  [98.9 F (37.2 C)-99.8 F (37.7 C)] 98.9 F (37.2 C) (06/16 0622) Pulse Rate:  [76-93] 93 (06/16 0622) Resp:  [16-18] 16 (06/16 0622) BP: (123-136)/(68-86) 128/73 mmHg (06/16 0622) SpO2:  [97 %-100 %] 97 % (06/16 0622) Weight:  [97.523 kg (215 lb)] 97.523 kg (215 lb) (06/15 1241) Last BM Date: 05/01/14  Intake/Output from previous day: 06/15 0701 - 06/16 0700 In: 410 [P.O.:360; IV Piggyback:50] Out: -  Intake/Output this shift:    awake and alert. VII nerve intact. ear with wick in place and no pinna swelling.   Lab Results:   Recent Labs  05/03/14 0141  WBC 10.9*  HGB 13.3  HCT 38.3  PLT 271   BMET  Recent Labs  05/03/14 0141  NA 136*  K 3.7  CL 101  CO2 24  GLUCOSE 93  BUN 14  CREATININE 1.13*  CALCIUM 9.0   PT/INR No results found for this basename: LABPROT, INR,  in the last 72 hours ABG No results found for this basename: PHART, PCO2, PO2, HCO3,  in the last 72 hours  Studies/Results: Ct Maxillofacial W/cm  05/03/2014   CLINICAL DATA:  Pain about the right ear and mastoid region. Mild leukocytosis.  EXAM: CT MAXILLOFACIAL WITH CONTRAST  TECHNIQUE: Multidetector CT imaging of the maxillofacial structures was performed with intravenous contrast. Multiplanar CT image reconstructions were also generated. A small metallic BB was placed on the right temple in order to reliably differentiate right from left.  CONTRAST:  190mL OMNIPAQUE IOHEXOL 300 MG/ML  SOLN  COMPARISON:  CT of the head performed 08/01/2011  FINDINGS: There is complete opacification of the right external auditory canal, extending deep to the expected location of the tympanic membrane. Minimal mucosal thickening is seen about the ossicles and tracking along the right eustachian tube, concerning for otitis media and mild otitis interna. There is  no evidence of osseous erosion at this time. The right mastoid air cells appear well-aerated. Remaining inner ear structures are grossly unremarkable.  A few associated prominent nodes are seen, anterior to the right external auditory canal. The left external auditory canal is unremarkable in appearance. The left mastoid air cells are also well aerated. The visualized paranasal sinuses are clear.  There is no evidence of fracture or dislocation. The maxilla and mandible appear intact. The nasal bone is unremarkable in appearance. The visualized dentition demonstrates no acute abnormality.  The orbits are intact bilaterally. The visualized portions of the brain are unremarkable in appearance. A few tonsilloliths are seen within the right palatine tonsil.  No additional soft tissue abnormalities are seen. The parapharyngeal fat planes are preserved. The nasopharynx, oropharynx and hypopharynx are unremarkable in appearance. The visualized portions of the valleculae and piriform sinuses are grossly unremarkable.  The parotid and submandibular glands are within normal limits. No cervical lymphadenopathy is seen.  IMPRESSION: 1. Complete opacification of the right external auditory canal, extending deep to the expected location of the tympanic membrane. Minimal mucosal thickening noted about the right ossicles and tracking along the right eustachian tube. Findings are concerning for otitis media and mild otitis interna. No evidence of osseous erosion at this time. The right mastoid air cells remain well-aerated. 2. Few mildly prominent associated nodes seen anterior to the right external auditory canal. 3. Few tonsilloliths incidentally noted within the right palatine tonsil.  Electronically Signed   By: Garald Balding M.D.   On: 05/03/2014 03:05    Anti-infectives: Anti-infectives   Start     Dose/Rate Route Frequency Ordered Stop   05/03/14 1400  piperacillin-tazobactam (ZOSYN) IVPB 3.375 g     3.375 g 12.5  mL/hr over 240 Minutes Intravenous 3 times per day 05/03/14 1228        Assessment/Plan: s/p * No surgery found * she is still having significant pain. will continue the treatment as it has not been 24 hours yet. She has not had a culture of the canal so this may be necessary if no response.   LOS: 1 day    Maria Sampson 05/04/2014

## 2014-05-04 NOTE — Progress Notes (Signed)
UR Completed.  Vergie Living [id number] 05/04/2014

## 2014-05-05 NOTE — Progress Notes (Signed)
  Subjective: She says she just started feeling better this am . She wants to stay oday to be sure she stays well.  Objective: Vital signs in last 24 hours: Temp:  [97.9 F (36.6 C)-98.6 F (37 C)] 97.9 F (36.6 C) (06/17 0637) Pulse Rate:  [64-90] 64 (06/17 0637) Resp:  [16-18] 16 (06/17 0637) BP: (111-132)/(62-74) 111/62 mmHg (06/17 0637) SpO2:  [100 %] 100 % (06/17 0637) Last BM Date: 05/01/14  Intake/Output from previous day: 06/16 0701 - 06/17 0700 In: 870 [P.O.:720; IV Piggyback:150] Out: -  Intake/Output this shift:    awake alert. actually smilling. no drainage from the ear VII intact. cv-regular l- clear ext no swelling or pain  Lab Results:   Recent Labs  05/03/14 0141  WBC 10.9*  HGB 13.3  HCT 38.3  PLT 271   BMET  Recent Labs  05/03/14 0141  NA 136*  K 3.7  CL 101  CO2 24  GLUCOSE 93  BUN 14  CREATININE 1.13*  CALCIUM 9.0   PT/INR No results found for this basename: LABPROT, INR,  in the last 72 hours ABG No results found for this basename: PHART, PCO2, PO2, HCO3,  in the last 72 hours  Studies/Results: No results found.  Anti-infectives: Anti-infectives   Start     Dose/Rate Route Frequency Ordered Stop   05/03/14 1400  piperacillin-tazobactam (ZOSYN) IVPB 3.375 g     3.375 g 12.5 mL/hr over 240 Minutes Intravenous 3 times per day 05/03/14 1228        Assessment/Plan: s/p * No surgery found * she can be change to po abx but will need social worker bc she cannot afford the meds outpatient. will observe her today and d/c in AM or this afternoon  LOS: 2 days    Melissa Montane 05/05/2014

## 2014-05-05 NOTE — Care Management Note (Signed)
  Page 1 of 1   05/05/2014     11:24:06 AM CARE MANAGEMENT NOTE 05/05/2014  Patient:  Maria Sampson, Maria Sampson   Account Number:  [id number]  Date Initiated:  05/05/2014  Documentation initiated by:  Magdalen Spatz  Subjective/Objective Assessment:     Action/Plan:   Anticipated DC Date:  05/06/2014   Anticipated DC Plan:  [place]  CM consult  [facility]  [facility] Program      Choice offered to / List presented to:             Status of service:   Medicare Important Message given?   (If response is "NO", the following Medicare IM given date fields will be blank) Date Medicare IM given:   Date Additional Medicare IM given:    Discharge Disposition:    Per UR Regulation:    If discussed at Long Length of Stay Meetings, dates discussed:    Comments:  05-05-14 [place] given and explained to patient for assistance with her antibiotics .  Provided patinet with resources for PCP also. Magdalen Spatz RN BSN

## 2014-05-05 NOTE — Clinical Social Work Note (Signed)
CSW consulted for medication assistance. Inappropriate consult for CSW. CSW has informed RNCM regarding consult.   Lubertha Sayres, MSW, [facility] Licensed Clinical Social Worker [phone] and [phone]

## 2014-05-06 MED ORDER — LEVOFLOXACIN 500 MG PO TABS: 500.0000 mg | ORAL_TABLET | Freq: Every day | ORAL | Status: DC

## 2014-05-06 NOTE — Progress Notes (Signed)
  Subjective: Doing well. No pain.  Objective: Vital signs in last 24 hours: Temp:  [97.9 F (36.6 C)-98.3 F (36.8 C)] 98.3 F (36.8 C) (06/18 0540) Pulse Rate:  [61-67] 64 (06/18 0540) Resp:  [17-20] 20 (06/18 0540) BP: (113-141)/(56-85) 123/64 mmHg (06/18 0540) SpO2:  [98 %-100 %] 100 % (06/18 0540) Last BM Date: 05/01/14  Intake/Output from previous day: 06/17 0701 - 06/18 0700 In: 840 [P.O.:840] Out: -  Intake/Output this shift: Total I/O In: 480 [P.O.:480] Out: -   no change  Lab Results:  No results found for this basename: WBC, HGB, HCT, PLT,  in the last 72 hours BMET No results found for this basename: NA, K, CL, CO2, GLUCOSE, BUN, CREATININE, CALCIUM,  in the last 72 hours PT/INR No results found for this basename: LABPROT, INR,  in the last 72 hours ABG No results found for this basename: PHART, PCO2, PO2, HCO3,  in the last 72 hours  Studies/Results: No results found.  Anti-infectives: Anti-infectives   Start     Dose/Rate Route Frequency Ordered Stop   05/03/14 1400  piperacillin-tazobactam (ZOSYN) IVPB 3.375 g     3.375 g 12.5 mL/hr over 240 Minutes Intravenous 3 times per day 05/03/14 1228        Assessment/Plan: s/p * No surgery found * Discharge  LOS: 3 days    Maria Sampson 05/06/2014

## 2014-05-06 NOTE — Discharge Summary (Signed)
Physician Discharge Summary  Patient ID: Maria Sampson MRN: [id number] DOB/AGE: 1990-12-27 23 y.o.  Admit date: 05/03/2014 Discharge date: 05/06/2014  Admission Diagnoses:right otits externa  Discharge Diagnoses: same Active Problems:   Otitis externa due to Pseudomonas aeruginosa   Discharged Condition: good  Hospital Course: hx of right ear OE and refractory to outpatient. Admitted for IV anx which worked in about 24 hours. Patient stayed another day at her request for certain resolution. She was without pain  And no c/o except muffled hearing. Patient was d/c to follow up in the office for wick removal. She is to come to ER if any face weakness or any other new or worsening symptoms and this was discussed  Consults: None  Significant Diagnostic Studies: none  Treatments: none  Discharge Exam: Blood pressure 123/64, pulse 64, temperature 98.3 F (36.8 C), temperature source Oral, resp. rate 20, height 5\' 10"  (1.778 m), weight 97.523 kg (215 lb), last menstrual period 04/16/2014, SpO2 100.00%. awake alert. no complaints. ear without drainage or swelling or erythema. VII intact. cv- regular l- clear ext- no swelling or pain  Disposition: ED Dismiss - Never Arrived  Discharge Instructions   Call MD for:  difficulty breathing, headache or visual disturbances    Complete by:  As directed      Call MD for:  extreme fatigue    Complete by:  As directed      Call MD for:  hives    Complete by:  As directed      Call MD for:  persistant dizziness or light-headedness    Complete by:  As directed      Call MD for:  persistant nausea and vomiting    Complete by:  As directed      Call MD for:  redness, tenderness, or signs of infection (pain, swelling, redness, odor or green/yellow discharge around incision site)    Complete by:  As directed      Call MD for:  severe uncontrolled pain    Complete by:  As directed      Call MD for:  temperature >100.4    Complete by:  As directed       Diet - low sodium heart healthy    Complete by:  As directed      Discharge instructions    Complete by:  As directed   Follow up in the office in the morning for wick removal. Call if any worsening or new symtoms     Increase activity slowly    Complete by:  As directed             Medication List    STOP taking these medications       amoxicillin 500 MG capsule  Commonly known as:  AMOXIL     amoxicillin-clavulanate 875-125 MG per tablet  Commonly known as:  AUGMENTIN      TAKE these medications       antipyrine-benzocaine otic solution  Commonly known as:  AURALGAN  Place 3-4 drops into the right ear every 2 (two) hours as needed for ear pain.     ciprofloxacin-dexamethasone otic suspension  Commonly known as:  CIPRODEX  Place 4 drops into the right ear 2 (two) times daily.     HYDROcodone-acetaminophen 5-325 MG per tablet  Commonly known as:  NORCO/VICODIN  Take 1 tablet by mouth every 6 (six) hours as needed for moderate pain.     levofloxacin 500 MG tablet  Commonly known as:  LEVAQUIN  Take 1 tablet (500 mg total) by mouth daily.     ondansetron 8 MG disintegrating tablet  Commonly known as:  ZOFRAN ODT  Take 1 tablet (8 mg total) by mouth every 8 (eight) hours as needed for nausea or vomiting.     oxyCODONE-acetaminophen 5-325 MG per tablet  Commonly known as:  PERCOCET/ROXICET  Take 2 tablets by mouth every 4 (four) hours as needed for severe pain.         SignedMelissa Montane 05/06/2014, 7:07 AM

## 2014-06-02 ENCOUNTER — Emergency Department (HOSPITAL_COMMUNITY)
Admission: EM | Admit: 2014-06-02 | Discharge: 2014-06-03 | Disposition: A | Discharge status: Home / Self Care | Payer: Medicaid Other | Attending: Emergency Medicine | Admitting: Emergency Medicine

## 2014-06-02 ENCOUNTER — Encounter (HOSPITAL_COMMUNITY): Payer: Self-pay | Admitting: Emergency Medicine

## 2014-06-02 DIAGNOSIS — T426X2A Poisoning by other antiepileptic and sedative-hypnotic drugs, intentional self-harm, initial encounter: Secondary | ICD-10-CM | POA: Insufficient documentation

## 2014-06-02 DIAGNOSIS — F32A Depression, unspecified: Secondary | ICD-10-CM

## 2014-06-02 DIAGNOSIS — F329 Major depressive disorder, single episode, unspecified: Secondary | ICD-10-CM | POA: Diagnosis present

## 2014-06-02 DIAGNOSIS — H6020 Malignant otitis externa, unspecified ear: Secondary | ICD-10-CM

## 2014-06-02 DIAGNOSIS — T426X1A Poisoning by other antiepileptic and sedative-hypnotic drugs, accidental (unintentional), initial encounter: Secondary | ICD-10-CM | POA: Insufficient documentation

## 2014-06-02 DIAGNOSIS — T50904A Poisoning by unspecified drugs, medicaments and biological substances, undetermined, initial encounter: Secondary | ICD-10-CM

## 2014-06-02 DIAGNOSIS — H60399 Other infective otitis externa, unspecified ear: Secondary | ICD-10-CM | POA: Insufficient documentation

## 2014-06-02 DIAGNOSIS — T368X1A Poisoning by other systemic antibiotics, accidental (unintentional), initial encounter: Secondary | ICD-10-CM | POA: Insufficient documentation

## 2014-06-02 DIAGNOSIS — X838XXA Intentional self-harm by other specified means, initial encounter: Secondary | ICD-10-CM | POA: Insufficient documentation

## 2014-06-02 DIAGNOSIS — T50901A Poisoning by unspecified drugs, medicaments and biological substances, accidental (unintentional), initial encounter: Secondary | ICD-10-CM

## 2014-06-02 DIAGNOSIS — Z79899 Other long term (current) drug therapy: Secondary | ICD-10-CM | POA: Insufficient documentation

## 2014-06-02 DIAGNOSIS — F3289 Other specified depressive episodes: Secondary | ICD-10-CM | POA: Insufficient documentation

## 2014-06-02 DIAGNOSIS — Z87891 Personal history of nicotine dependence: Secondary | ICD-10-CM | POA: Insufficient documentation

## 2014-06-02 DIAGNOSIS — T50992A Poisoning by other drugs, medicaments and biological substances, intentional self-harm, initial encounter: Secondary | ICD-10-CM | POA: Insufficient documentation

## 2014-06-02 DIAGNOSIS — T4271XA Poisoning by unspecified antiepileptic and sedative-hypnotic drugs, accidental (unintentional), initial encounter: Secondary | ICD-10-CM

## 2014-06-02 DIAGNOSIS — R45851 Suicidal ideations: Secondary | ICD-10-CM

## 2014-06-02 DIAGNOSIS — T4272XA Poisoning by unspecified antiepileptic and sedative-hypnotic drugs, intentional self-harm, initial encounter: Secondary | ICD-10-CM | POA: Insufficient documentation

## 2014-06-02 DIAGNOSIS — B965 Pseudomonas (aeruginosa) (mallei) (pseudomallei) as the cause of diseases classified elsewhere: Secondary | ICD-10-CM | POA: Insufficient documentation

## 2014-06-02 LAB — COMPREHENSIVE METABOLIC PANEL
ALK PHOS: 63 U/L (ref 39–117)
ALT: 19 U/L (ref 0–35)
AST: 29 U/L (ref 0–37)
Albumin: 3.6 g/dL (ref 3.5–5.2)
Anion gap: 14 (ref 5–15)
BILIRUBIN TOTAL: 0.3 mg/dL (ref 0.3–1.2)
BUN: 8 mg/dL (ref 6–23)
CO2: 21 meq/L (ref 19–32)
CREATININE: 1.05 mg/dL (ref 0.50–1.10)
Calcium: 9.1 mg/dL (ref 8.4–10.5)
Chloride: 107 mEq/L (ref 96–112)
GFR calc Af Amer: 85 mL/min — ABNORMAL LOW (ref >90)
GFR, EST NON AFRICAN AMERICAN: 74 mL/min — AB (ref >90)
GLUCOSE: 97 mg/dL (ref 70–99)
Potassium: 4 mEq/L (ref 3.7–5.3)
Sodium: 142 mEq/L (ref 137–147)
Total Protein: 6 g/dL (ref 6.0–8.3)

## 2014-06-02 LAB — CBC WITH DIFFERENTIAL/PLATELET
Basophils Absolute: 0 10*3/uL (ref 0.0–0.1)
Basophils Relative: 0 % (ref 0–1)
Eosinophils Absolute: 0.1 10*3/uL (ref 0.0–0.7)
Eosinophils Relative: 1 % (ref 0–5)
HEMATOCRIT: 35.6 % — AB (ref 36.0–46.0)
HEMOGLOBIN: 12.3 g/dL (ref 12.0–15.0)
LYMPHS ABS: 1.2 10*3/uL (ref 0.7–4.0)
Lymphocytes Relative: 17 % (ref 12–46)
MCH: 30.4 pg (ref 26.0–34.0)
MCHC: 34.6 g/dL (ref 30.0–36.0)
MCV: 88.1 fL (ref 78.0–100.0)
MONO ABS: 0.4 10*3/uL (ref 0.1–1.0)
MONOS PCT: 6 % (ref 3–12)
Neutro Abs: 5.6 10*3/uL (ref 1.7–7.7)
Neutrophils Relative %: 76 % (ref 43–77)
Platelets: 226 10*3/uL (ref 150–400)
RBC: 4.04 MIL/uL (ref 3.87–5.11)
RDW: 12.7 % (ref 11.5–15.5)
WBC: 7.3 10*3/uL (ref 4.0–10.5)

## 2014-06-02 LAB — RAPID URINE DRUG SCREEN, HOSP PERFORMED
Amphetamines: NOT DETECTED
Barbiturates: NOT DETECTED
Benzodiazepines: NOT DETECTED
Cocaine: NOT DETECTED
Opiates: POSITIVE — AB
Tetrahydrocannabinol: NOT DETECTED

## 2014-06-02 LAB — SALICYLATE LEVEL: Salicylate Lvl: <2 mg/dL — ABNORMAL LOW (ref 2.8–20.0)

## 2014-06-02 LAB — ACETAMINOPHEN LEVEL
ACETAMINOPHEN (TYLENOL), SERUM: 45.4 ug/mL — AB (ref 10–30)
ACETAMINOPHEN (TYLENOL), SERUM: 20 ug/mL (ref 10–30)

## 2014-06-02 LAB — ETHANOL

## 2014-06-02 MED ORDER — ZIPRASIDONE MESYLATE 20 MG IM SOLR
10.0000 mg | Freq: Once | INTRAMUSCULAR | Status: AC
  Administered 2014-06-02: 10 mg via INTRAMUSCULAR

## 2014-06-02 MED ORDER — LORAZEPAM 2 MG/ML IJ SOLN
INTRAMUSCULAR | Status: AC
  Administered 2014-06-02: 2 mg via INTRAMUSCULAR
  Filled 2014-06-02: qty 1

## 2014-06-02 MED ORDER — ZIPRASIDONE MESYLATE 20 MG IM SOLR
20.0000 mg | Freq: Once | INTRAMUSCULAR | Status: AC
Start: 2014-06-02 — End: 2014-06-02
  Administered 2014-06-02: 07:00:00 via INTRAMUSCULAR

## 2014-06-02 MED ORDER — ZIPRASIDONE MESYLATE 20 MG IM SOLR
INTRAMUSCULAR | Status: AC
  Filled 2014-06-02: qty 20

## 2014-06-02 MED ORDER — LORAZEPAM 2 MG/ML IJ SOLN
2.0000 mg | Freq: Once | INTRAMUSCULAR | Status: AC
  Administered 2014-06-02: 2 mg via INTRAMUSCULAR

## 2014-06-02 MED ORDER — STERILE WATER FOR INJECTION IJ SOLN
INTRAMUSCULAR | Status: AC
  Administered 2014-06-02: 07:00:00
  Filled 2014-06-02: qty 10

## 2014-06-02 MED ORDER — DIPHENHYDRAMINE HCL 50 MG/ML IJ SOLN: 50.0000 mg | Freq: Once | INTRAMUSCULAR | Status: DC

## 2014-06-02 MED ORDER — HALOPERIDOL LACTATE 5 MG/ML IJ SOLN: 5.0000 mg | Freq: Four times a day (QID) | INTRAMUSCULAR | Status: DC | PRN

## 2014-06-02 MED ORDER — LORAZEPAM 2 MG/ML IJ SOLN: 2.0000 mg | INTRAMUSCULAR | Status: DC | PRN

## 2014-06-02 NOTE — ED Notes (Signed)
Patient is sleeping.  Now trialing patient out of restraints.  All restraints undone from bed frame.

## 2014-06-02 NOTE — ED Notes (Signed)
Pt is refusing to give her cellphone, she is arguing loudly, belligerent toward nursing staff. When attempted to check her vital signs she started rocking and shaking her entire body saying "I will not let you check anything white bitch". Pt stood up and pulled off blood pressure cuff with her teeth. Charge RN at the bedside trying to explain IVC process and hospital policies to pt. Pt is however constantly yelling and screaming obscenities.

## 2014-06-02 NOTE — ED Notes (Signed)
Patient was medicated on prior shift for violent out burst. Respirations equal and unlabored, skin warm and dry and No acute distress noted.

## 2014-06-02 NOTE — ED Notes (Signed)
Moved to Psych ED 42

## 2014-06-02 NOTE — ED Notes (Signed)
Has been sleeping comfortably since transfer to room 42. Respirations regular and unlabored.

## 2014-06-02 NOTE — ED Notes (Signed)
Pt is attempting to leave, security called, Dr Randal Buba initiated IVC paperwork. Pt has refused to have her vital signs checked or to be evaluated in any way. She is cursing at staff members, loudly arguing with charge nurse and Sports administrator. She stated that person that brought her in is not even her brother and that she doesn't know his name. 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 Pt was explained IVC process, and was placed in restraints with help of security officers and [employer] officers. Pt is still cursing at staff and threatening charge RN.

## 2014-06-02 NOTE — ED Notes (Signed)
Pt refuses to give any information, pt's brother states pt took unknown amount of Vicodin, amoxicillin and hydropromethasone. Pt told triage nurse "I just want to die", however now pt is stating " I want to go home it's none of your damn business what I do with my life."  Patient will not answer any questions or allow her vital signs to be checked. MD notified

## 2014-06-02 NOTE — ED Provider Notes (Signed)
CSN: [id number]     Arrival date & time 06/02/14  0556 History   First MD Initiated Contact with Patient 06/02/14 [phone]     Chief Complaint  Patient presents with  . Depression  . Ingestion  . Suicidal     (Consider location/radiation/quality/duration/timing/severity/associated sxs/prior Treatment) HPI Comments: Patient is a 23 year old female with no significant past medical history who presents with her brother who provides the history. Patient's brother reports he received a text from the patient's friend saying she took an unknown amount of Vicodin and amoxicillin. Patient is refusing to answer questions or offer any information. She sent a text message to her friend saying "goodbye".  Patient denies any other drug or alcohol use. Patient denies HI but reports SI.   Patient is a 23 y.o. female presenting with Ingested Medication. The history is provided by a relative. No language interpreter was used.  Ingestion This is a new problem. The current episode started today. Pertinent negatives include no abdominal pain, arthralgias, chest pain, chills, fatigue, fever, nausea, neck pain, vomiting or weakness.    Past Medical History  Diagnosis Date  . Otitis externa due to Pseudomonas aeruginosa 04/2014   History reviewed. No pertinent past surgical history. Family History  Problem Relation Age of Onset  . Crohn's disease Other    History  Substance Use Topics  . Smoking status: Former Smoker    Types: Cigarettes    Quit date: 03/03/2014  . Smokeless tobacco: Never Used  . Alcohol Use: Yes     Comment: social   OB History   Grav Para Term Preterm Abortions TAB SAB Ect Mult Living                 Review of Systems  Constitutional: Negative for fever, chills and fatigue.  HENT: Negative for trouble swallowing.   Eyes: Negative for visual disturbance.  Respiratory: Negative for shortness of breath.   Cardiovascular: Negative for chest pain and palpitations.  Gastrointestinal:  Negative for nausea, vomiting, abdominal pain and diarrhea.  Genitourinary: Negative for dysuria and difficulty urinating.  Musculoskeletal: Negative for arthralgias and neck pain.  Skin: Negative for color change.  Neurological: Negative for dizziness and weakness.  Psychiatric/Behavioral: Positive for suicidal ideas, self-injury and dysphoric mood.      Allergies  Food  Home Medications   Prior to Admission medications   Medication Sig Start Date End Date Taking? Authorizing Provider  antipyrine-benzocaine [facility]) otic solution Place 3-4 drops into the right ear every 2 (two) hours as needed for ear pain. 05/03/14   Kalman Drape, MD  ciprofloxacin-dexamethasone [facility]) otic suspension Place 4 drops into the right ear 2 (two) times daily.    Historical Provider, MD  HYDROcodone-acetaminophen (NORCO/VICODIN) 5-325 MG per tablet Take 1 tablet by mouth every 6 (six) hours as needed for moderate pain.    Historical Provider, MD  levofloxacin (LEVAQUIN) 500 MG tablet Take 1 tablet (500 mg total) by mouth daily. 05/06/14   Melissa Montane, MD  ondansetron (ZOFRAN ODT) 8 MG disintegrating tablet Take 1 tablet (8 mg total) by mouth every 8 (eight) hours as needed for nausea or vomiting. 05/03/14   Kalman Drape, MD  oxyCODONE-acetaminophen (PERCOCET/ROXICET) 5-325 MG per tablet Take 2 tablets by mouth every 4 (four) hours as needed for severe pain. 05/03/14   Kalman Drape, MD   There were no vitals taken for this visit. Physical Exam  Nursing note and vitals reviewed. Constitutional: She is oriented to person,  place, and time. She appears well-developed and well-nourished. No distress.  HENT:  Head: Normocephalic and atraumatic.  Eyes: Conjunctivae and EOM are normal.  Neck: Normal range of motion.  Cardiovascular: Normal rate and regular rhythm.   Pulmonary/Chest: Effort normal and breath sounds normal.  Abdominal: She exhibits no distension.  Musculoskeletal: Normal range of motion.   Neurological: She is alert and oriented to person, place, and time.  Speech is goal-oriented. Moves limbs without ataxia.   Skin: Skin is warm and dry.  Psychiatric:  Flat affect. Dysphoric mood. Patient is tearful.     ED Course  Procedures (including critical care time) Labs Review Labs Reviewed  CBC WITH DIFFERENTIAL - Abnormal; Notable for the following:    HCT 35.6 (*)    All other components within normal limits  COMPREHENSIVE METABOLIC PANEL - Abnormal; Notable for the following:    GFR calc non Af Amer 74 (*)    GFR calc Af Amer 85 (*)    All other components within normal limits  ACETAMINOPHEN LEVEL - Abnormal; Notable for the following:    Acetaminophen (Tylenol), Serum 45.4 (*)    All other components within normal limits  SALICYLATE LEVEL - Abnormal; Notable for the following:    Salicylate Lvl <0.5 (*)    All other components within normal limits  URINE RAPID DRUG SCREEN (HOSP PERFORMED) - Abnormal; Notable for the following:    Opiates POSITIVE (*)    All other components within normal limits  ETHANOL  ACETAMINOPHEN LEVEL    Imaging Review No results found.   EKG Interpretation   Date/Time:  Wednesday June 02 2014 07:27:54 EDT Ventricular Rate:  152 PR Interval:  114 QRS Duration: 86 QT Interval:  284 QTC Calculation: 452 R Axis:   89 Text Interpretation:  Sinus tachycardia Borderline repolarization  abnormality Baseline wander in lead(s) I II III aVR aVL aVF V3 V5  Confirmed by [facility]  MD, APRIL ([id number]) on 06/02/2014 10:54:52 AM      MDM   Final diagnoses:  Overdose, undetermined intent, initial encounter  Depressive disorder  Suicidal ideation  Overdose, accidental or unintentional, initial encounter  Otitis externa due to Pseudomonas aeruginosa    6:23 AM Patient refusing to provide information and not being cooperative for labs. Patient trying to leave. Security and [employer] now at her room. IVC papers completed and faxed.   Dr.  Randal Buba and I arrive in the patient's room around 6:10 am. Patient was being uncooperative and refusing to provide information. Patient's brother provided information and showed [place] the text messages that read the patient told a friend that she took and unknown amount of medication. Security was called as well as [employer]. IVC papers were faxed to the magistrate. No one touched the patient until papers were approved. Patient was given 10mg  Geodon IM. EKG performed and repeat 10mg  as long as no EKG changes. We were unable to perform a physical exam on the patient due to patient being uncooperative.   Patient's tylenol level rechecked and has improved. Patient is medically clear to psychiatric evaluation.   Alvina Chou, PA-C 06/04/14 1049

## 2014-06-02 NOTE — ED Notes (Signed)
Security on standby for lab draw

## 2014-06-02 NOTE — ED Notes (Signed)
Belongings locked in locker 30

## 2014-06-02 NOTE — ED Notes (Addendum)
Pt's brother can be reached at [phone]. His name is Web designer.

## 2014-06-02 NOTE — ED Notes (Signed)
Belongings moved to locker #42

## 2014-06-02 NOTE — ED Notes (Signed)
Writer went to ask pt for blood work and Maria Sampson, pt refused all procedures,VS.  Primary school teacher, EDP, and PA

## 2014-06-02 NOTE — ED Notes (Signed)
One bag pt belongings containing: lighter, carmex, sandals, pants (cut), underwear (cut), bra (cut), and socks. Valuables locked with security.

## 2014-06-02 NOTE — Consult Note (Signed)
  Patient sleeping in four point restraints unable to assess at this time.  Shuvon B. Rankin FNP-BC  Reviewed the information documented and agree with the treatment plan.  Iley Deignan,JANARDHAHA R. 06/03/2014 9:56 AM

## 2014-06-02 NOTE — ED Notes (Signed)
Pt repeatedly pulls off blanket. Staff put blanket back over pt.

## 2014-06-02 NOTE — ED Notes (Signed)
Asked pt to changed into gown. Pt refused, swearing at staff. Clothing cut off, pt put into gown, but pt pulled gown off.

## 2014-06-02 NOTE — ED Notes (Signed)
Pt arrives to the ER with family, pt refuses to give name or any information; family states "she is severely depressed and overdosed"; pt refused to answer whether she overdosed; when pt asked "are you suicidal and want to die?"; pt states "I want to die" pt advised that since she said she wanted to die she would be evaluated"

## 2014-06-02 NOTE — ED Notes (Signed)
attempted to recollect vital pt would not allow and remains verbally aggressive towards me with racial slurs.  Pt refuses to call me by my name calling me "felipe, jose".

## 2014-06-02 NOTE — ED Notes (Signed)
Patient belongings relocated to locker #28

## 2014-06-02 NOTE — ED Notes (Signed)
Belongings moved to locker 28

## 2014-06-02 NOTE — ED Provider Notes (Signed)
15 am  Went with patient's nurse and Verline Lema to see patient, and patient was refusing history and physical and lab work.  Texts to friend by patient stating intent to harm self viewed but no one in room touched the patient even with a stethoscope.  IVC papers filled out and sent to magistrate.    Carlisle Beers, MD 06/02/14 [phone]

## 2014-06-02 NOTE — ED Notes (Signed)
PA at bedside.

## 2014-06-02 NOTE — ED Notes (Addendum)
Upon entrance into the room, pt was refusing to give her cell phone or clothes.  IVC process was explained to patient.  Pt continued to refuse.  Pt demanded to see IVC paperwork.  IVC paperwork was shown to patient and IVC process was again explained.  Pt was asked again to cooperate and surrender her clothes and cell phone.  IVC process again and explained that clothes must be surrendered.  Pt says "Well cut my clothes off bitch! Cause I'm gonna sue you and I expect to be reimbursed." It was explained to patient that we were giving her every opportunity to surrender her clothes intact and that cutting them off was a last resort.  Pt continued to say "Cut em off bitch, see what I do".  Pt was physically and verbally aggressive during procedure of obtaining clothes.  Pt's modesty was maintained with a sheet, while patient was also trying to pull it off.  Pt yelled that if any white cops touched her, she would sue.  Elwyn Lade, NT assisted with restraint of patient.  Pt was yelling racial slurs at him such as "Get off me Modena Slater, you smell like burritos.  Don't you have brothers and sisters you need to go cook for?  You probably are gonna steal all my shit and give it to [facility]".  After clothes were cut off, pt continued to be physically aggressive and refuse to keep on a gown, still pulling it off by sitting straight up in bed and using her restrained hands to yank gown off.  It was explained to patient that we were going to maintain her modesty even if she refused.  A bari gown was put on the patient.  Pt was still able to free herself from gown.  Pt was then placed in a chest restraint and gown replaced onto patient.  Pt airway intact, A&O x 4, circulation maintained in all extremities.

## 2014-06-03 ENCOUNTER — Encounter (HOSPITAL_COMMUNITY): Payer: Self-pay | Admitting: Registered Nurse

## 2014-06-03 DIAGNOSIS — F32A Depression, unspecified: Secondary | ICD-10-CM | POA: Diagnosis present

## 2014-06-03 DIAGNOSIS — R45851 Suicidal ideations: Secondary | ICD-10-CM

## 2014-06-03 DIAGNOSIS — F3289 Other specified depressive episodes: Secondary | ICD-10-CM

## 2014-06-03 DIAGNOSIS — F329 Major depressive disorder, single episode, unspecified: Secondary | ICD-10-CM | POA: Diagnosis present

## 2014-06-03 DIAGNOSIS — T50901A Poisoning by unspecified drugs, medicaments and biological substances, accidental (unintentional), initial encounter: Secondary | ICD-10-CM

## 2014-06-03 NOTE — ED Notes (Signed)
Patient states that "she needs her clothes and to go home" writer explain to patient that the only person that could reverse her IVC was Psychiatrist. Patient continues to shake her legs, and have rapid pressure speech when she talks to me. Patient asked for soda and for the TV to be change and soda given and TV changed. Encouragement and support provided and safety maintain.

## 2014-06-03 NOTE — ED Notes (Signed)
The patient in room 41 had put his light on, I went to answer his light and Ms. Delavega called out to me and I explained to her that I would be right back once I saw what the other patient had needed. I wen back in her room and she started yelling and saying that she needed to call her mother and let them know where she is, it is her birthday this week and she has to go. I explained to her that I will talk with there nurse and come back. I cam back and explained to her that patient phone calls ended and their was someone she could talk which was Polo Riley (TTS) but she had refused she said she needs to talk with a doctor. I explained a psychiatrist will be here in the morning and he will make the decision to what would be best for her. She started screaming saying she does not care about that she will be leaving and we cannot stop her. I gave her a copy of her IVC paperwork and explained if she was to try to leave we will have to call security and [employer] to come and help [place] get her calmed down. She was mad and still yelling , threw the papers down and said she is leaving and no one will stop her. Charge nurse Rashell informed and witnessed the situation we were having with her.

## 2014-06-03 NOTE — [facility] Counselor (Signed)
Pt's IVC has been rescinded by Dr Louretta Shorten. IVC paperwork placed in [facility] IVC NB. Pt continues to deny SI. Writer gave pt following info:  [employer] [facility] [employer] [facility] [facility] is the only campus agency that provides comprehensive mental health services to enrolled students. Although our office name is [facility]ing [facility], we provide more services than our name implies. We support the academic mission of the university by also providing academic coaching, career assessment, crisis intervention, consultation and a variety of workshops and presentations to assist you in reaching your personal, academic and career goals. Our services are offered in an atmosphere that is welcoming and respectful of all students regardless of race, gender, ethnicity, age, sexual orientation, citizenship or physical status. The following services are currently being offered:   Consultations   Personal Counseling   Career Counseling & Assessment   Academic Counseling   Groups   Testing   Outreach   Practicum & Internship for Graduate Students  Arnold Long, [place] Counselor

## 2014-06-03 NOTE — BH Assessment (Signed)
Assessment Note  Maria Sampson is an 23 y.o. female. Per chart review, pt was placed under IVC after presentation to [facility] d/t aggressive bx. Pt irritable and facing straight ahead towards wall at time of assessment. Pt sts, "I want a lawyer" multiple times during the assessment. Pt guarded with answers and asks, "Why are you asking me that" more than once. She refuses to answer some questions asked. Pt denies SI and HI. She denies chart notes that report pt took an overdose of meds. As pt doesn't answer questions, it is difficult to ascertain accurate reason for presentation to [facility]. She denies [facility] and no delusions noted. Pt denies hx of suicide attempts and denies hx of self harm. 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 Pt sts she was at a friend's house and had drunk two 8 oz beers. She says she then dosed off and her roommate called EMS.  Pt's UDS was + for opiates but pt denies use. She sts she was at [facility] for five days a few weeks ago for ear infection and stated she could have taken pain meds recently for ear pain. Pt sts her mood is euthymic. She sts, "I'm a goofball, I'm happy." Pt reports strong social supports including her fianc. Pt denies access to weapons. Pt denies hx of [place] inpatient or outpatient treatment. Pt denies hx of drug abuse. At present, pt does not meet inpatient criteria. Pt to be evaluated by psychiatry.   Axis I:  Unspecified Depressive Disorder Axis II: Deferred Axis III:  Past Medical History  Diagnosis Date  . Otitis externa due to Pseudomonas aeruginosa 04/2014   Axis IV: other psychosocial or environmental problems and problems related to social environment Axis V: 51-60 moderate symptoms  Past Medical History:  Past Medical History  Diagnosis Date  . Otitis externa due to Pseudomonas aeruginosa 04/2014    History reviewed. No pertinent past surgical history.  Family History:  Family History  Problem Relation  Age of Onset  . Crohn's disease Other     Social History:  reports that she quit smoking about 3 months ago. Her smoking use included Cigarettes. She smoked 0.00 packs per day. She has never used smokeless tobacco. She reports that she drinks alcohol. She reports that she does not use illicit drugs.  Additional Social History:  Alcohol / Drug Use Pain Medications: pt denies abuse Prescriptions: pt denies abuse Over the Counter: pt denies abuse History of alcohol / drug use?:  (pt sts drank two 8 oz beers 06/01/14 pm)  CIWA: CIWA-Ar BP: 106/69 mmHg Pulse Rate: 65 COWS: Clinical Opiate Withdrawal Scale (COWS) Resting Pulse Rate: Pulse Rate 80 or below Sweating: No report of chills or flushing Restlessness: Able to sit still Pupil Size: Pupils pinned or normal size for room light Bone or Joint Aches: Not present Runny Nose or Tearing: Not present GI Upset: No GI symptoms Tremor: No tremor Yawning: No yawning Anxiety or Irritability: None Gooseflesh Skin: Skin is smooth COWS Total Score: 0  Allergies:  Allergies  Allergen Reactions  . Food Hives, Itching and Swelling    PEANUTS    Home Medications:  (Not in a hospital admission)  OB/GYN Status:  No LMP recorded.  General Assessment Data Location of Assessment: [facility] Is this a Tele or Face-to-Face Assessment?: Face-to-Face Is this an Initial Assessment or a Re-assessment for this encounter?: Initial Assessment Living Arrangements: Non-relatives/Friends (roommate female) Can  pt return to current living arrangement?: Yes Admission Status: Involuntary Is patient capable of signing voluntary admission?: Yes Transfer from: Home Referral Source: Self/Family/Friend (roommate)     [place] Living Arrangements: Non-relatives/Friends (roommate female) Name of Psychiatrist: none Name of Therapist: none  Education Status Is patient currently in school?: Yes Current Grade: 15 (rising senior) Highest grade of school  patient has completed: 33 Name of school: [employer]  Risk to self Suicidal Ideation: No Suicidal Intent: No Is patient at risk for suicide?: No Suicidal Plan?: No Access to Means: No What has been your use of drugs/alcohol within the last 12 months?: pt sts drank 2 oz beers Previous Attempts/Gestures: No (pt denies) Other Self Harm Risks: none Triggers for Past Attempts:  (n/a) Intentional Self Injurious Behavior: None Family Suicide History: No Recent stressful life event(s): Other (Comment) (pt sts only stressor is being in [employer]) Persecutory voices/beliefs?: No Depression: No Substance abuse history and/or treatment for substance abuse?: No Suicide prevention information given to non-admitted patients: Not applicable  Risk to Others Homicidal Ideation: No Thoughts of Harm to Others: No Current Homicidal Intent: No Current Homicidal Plan: No Access to Homicidal Means: No Identified Victim: none History of harm to others?: No Assessment of Violence: None Noted Violent Behavior Description: pt verbally aggressive while in [facility] Does patient have access to weapons?: No Criminal Charges Pending?: Yes Describe Pending Criminal Charges: assault by pointing a gun (pt denies - per web search, "assault by pointing a gun") Does patient have a court date: Yes Court Date: 07/16/14  Psychosis Hallucinations: None noted Delusions: None noted  Mental Status Report Appear/Hygiene: Unremarkable;In scrubs Eye Contact: Poor Motor Activity: Freedom of movement;Restlessness Speech: Logical/coherent;Loud Level of Consciousness: Irritable;Alert Mood: Euthymic Affect: Angry;Sullen;Irritable Anxiety Level:  (unable to assess) Thought Processes: Relevant;Coherent Judgement: Unimpaired Orientation: Person;Situation;Place;Time Obsessive Compulsive Thoughts/Behaviors: None  Cognitive Functioning Concentration: Unable to Assess Memory: Unable to Assess IQ: Average Insight: Fair Impulse  Control: Poor Appetite: Good Sleep: No Change Total Hours of Sleep: 7 Vegetative Symptoms: None  ADLScreening [facility] Assessment Services) Patient's cognitive ability adequate to safely complete daily activities?: Yes Patient able to express need for assistance with ADLs?: Yes Independently performs ADLs?: Yes (appropriate for developmental age)  Prior Inpatient Therapy Prior Inpatient Therapy: No Prior Therapy Dates: na Prior Therapy Facilty/Provider(s): na Reason for Treatment: na  Prior Outpatient Therapy Prior Outpatient Therapy: No Prior Therapy Dates: na Prior Therapy Facilty/Provider(s): na Reason for Treatment: na  ADL Screening (condition at time of admission) Patient's cognitive ability adequate to safely complete daily activities?: Yes Is the patient deaf or have difficulty hearing?: No Does the patient have difficulty seeing, even when wearing glasses/contacts?: No Does the patient have difficulty concentrating, remembering, or making decisions?: No Patient able to express need for assistance with ADLs?: Yes Does the patient have difficulty dressing or bathing?: No Independently performs ADLs?: Yes (appropriate for developmental age) Does the patient have difficulty walking or climbing stairs?: No Weakness of Legs: None Weakness of Arms/Hands: None  Home Assistive Devices/Equipment Home Assistive Devices/Equipment: None    Abuse/Neglect Assessment (Assessment to be complete while patient is alone) Physical Abuse: Denies Verbal Abuse: Denies Sexual Abuse: Denies Exploitation of patient/patient's resources: Denies Self-Neglect: Denies Values / Beliefs Cultural Requests During Hospitalization: None Spiritual Requests During Hospitalization: None   Advance Directives (For Healthcare) Advance Directive: Patient has advance directive, copy not in chart Type of Advance Directive: Living will    Additional Information 1:1 In Past 12 Months?: No  CIRT Risk:  Yes Elopement Risk: Yes Does patient have medical clearance?: Yes     Disposition:  Disposition Initial Assessment Completed for this Encounter: Yes Disposition of Patient: Outpatient treatment Type of outpatient treatment: Adult  On Site Evaluation by:   Reviewed with Physician:    Leron Croak P 06/03/2014 9:48 AM

## 2014-06-03 NOTE — [facility] Suicide Risk Assessment (Cosign Needed)
Suicide Risk Assessment  Discharge Assessment     Demographic Factors:  Black Female  Total Time spent with patient: 30 minutes Psychiatric Specialty Exam:      Blood pressure 136/84, pulse 81, temperature 98.4 F (36.9 C), temperature source Oral, resp. rate 20, SpO2 100.00%.There is no weight on file to calculate BMI.   General Appearance: Casual   Eye Contact:: Good   Speech: Clear and Coherent and Normal Rate   Volume: Increased   Mood: Angry and Irritable   Affect: Blunt and Congruent   Thought Process: Circumstantial   Orientation: Full (Time, Place, and Person)   Thought Content: Rumination   Suicidal Thoughts: No   Homicidal Thoughts: No   Memory: Immediate; Good  Recent; Good  Remote; Good   Judgement: Intact   Insight: Present   Psychomotor Activity: Normal   Concentration: Good   Recall: Good   Fund of Knowledge:Good   Language: Good   Akathisia: No   Handed: Right   AIMS (if indicated):   Assets: Communication Skills  Desire for Improvement  Housing  Social Support  Transportation  Vocational/Educational   Sleep:   Musculoskeletal:  Strength & Muscle Tone: within normal limits  Gait & Station: normal  Patient leans: N/A    Mental Status Per Nursing Assessment::   On Admission:     Current Mental Status by Physician: Patient denies suicidal/homicidal ideation; psychosis, and paranoia  Loss Factors: NA  Historical Factors: NA  Risk Reduction Factors:   Sense of responsibility to family and Positive social support  Continued Clinical Symptoms:  None noted  Cognitive Features That Contribute To Risk:  None noted    Suicide Risk:  Minimal: No identifiable suicidal ideation.  Patients presenting with no risk factors but with morbid ruminations; may be classified as minimal risk based on the severity of the depressive symptoms  Discharge Diagnoses:  AXIS I: Depressive Disorder NOS  AXIS II: Deferred  AXIS III:  Past Medical History    Diagnosis  Date   .  Otitis externa due to Pseudomonas aeruginosa  04/2014    AXIS IV: other psychosocial or environmental problems  AXIS V: 61-70 mild symptoms      Plan Of Care/Follow-up recommendations:  Activity:  resume usual activity Diet:  Resume usual diet  Is patient on multiple antipsychotic therapies at discharge:  No   Has Patient had three or more failed trials of antipsychotic monotherapy by history:  No  Recommended Plan for Multiple Antipsychotic Therapies: NA    Shenandoah Vandergriff, FNP-BC 06/03/2014, 1:18 PM

## 2014-06-03 NOTE — Discharge Instructions (Signed)
Depression, Adult Depression refers to feeling sad, low, down in the dumps, blue, gloomy, or empty. In general, there are two kinds of depression: 1. Depression that we all experience from time to time because of upsetting life experiences, including the loss of a job or the ending of a relationship (normal sadness or normal grief). This kind of depression is considered normal, is short lived, and resolves within a few days to 2 weeks. (Depression experienced after the loss of a loved one is called bereavement. Bereavement often lasts longer than 2 weeks but normally gets better with time.) 2. Clinical depression, which lasts longer than normal sadness or normal grief or interferes with your ability to function at home, at work, and in school. It also interferes with your personal relationships. It affects almost every aspect of your life. Clinical depression is an illness. Symptoms of depression also can be caused by conditions other than normal sadness and grief or clinical depression. Examples of these conditions are listed as follows:  Physical illness--Some physical illnesses, including underactive thyroid gland (hypothyroidism), severe anemia, specific types of cancer, diabetes, uncontrolled seizures, heart and lung problems, strokes, and chronic pain are commonly associated with symptoms of depression.  Side effects of some prescription medicine--In some people, certain types of prescription medicine can cause symptoms of depression.  Substance abuse--Abuse of alcohol and illicit drugs can cause symptoms of depression. SYMPTOMS Symptoms of normal sadness and normal grief include the following:  Feeling sad or crying for short periods of time.  Not caring about anything (apathy).  Difficulty sleeping or sleeping too much.  No longer able to enjoy the things you used to enjoy.  Desire to be by oneself all the time (social isolation).  Lack of energy or motivation.  Difficulty  concentrating or remembering.  Change in appetite or weight.  Restlessness or agitation. Symptoms of clinical depression include the same symptoms of normal sadness or normal grief and also the following symptoms:  Feeling sad or crying all the time.  Feelings of guilt or worthlessness.  Feelings of hopelessness or helplessness.  Thoughts of suicide or the desire to harm yourself (suicidal ideation).  Loss of touch with reality (psychotic symptoms). Seeing or hearing things that are not real (hallucinations) or having false beliefs about your life or the people around you (delusions and paranoia). DIAGNOSIS  The diagnosis of clinical depression usually is based on the severity and duration of the symptoms. Your caregiver also will ask you questions about your medical history and substance use to find out if physical illness, use of prescription medicine, or substance abuse is causing your depression. Your caregiver also may order blood tests. TREATMENT  Typically, normal sadness and normal grief do not require treatment. However, sometimes antidepressant medicine is prescribed for bereavement to ease the depressive symptoms until they resolve. The treatment for clinical depression depends on the severity of your symptoms but typically includes antidepressant medicine, counseling with a mental health professional, or a combination of both. Your caregiver will help to determine what treatment is best for you. Depression caused by physical illness usually goes away with appropriate medical treatment of the illness. If prescription medicine is causing depression, talk with your caregiver about stopping the medicine, decreasing the dose, or substituting another medicine. Depression caused by abuse of alcohol or illicit drugs abuse goes away with abstinence from these substances. Some adults need professional help in order to stop drinking or using drugs. SEEK IMMEDIATE CARE IF:  You have thoughts  about   hurting yourself or others.  You lose touch with reality (have psychotic symptoms).  You are taking medicine for depression and have a serious side effect. FOR MORE INFORMATION National Alliance on Mental Illness: www.nami.org National Institute of Mental Health: www.nimh.nih.gov Document Released: 11/02/2000 Document Revised: 05/06/2012 Document Reviewed: 02/04/2012 Exit[employer] Patient Information 2015 [employer]. This information is not intended to replace advice given to you by your health care provider. Make sure you discuss any questions you have with your health care provider.  
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
## 2014-06-03 NOTE — Progress Notes (Signed)
Patient was given all personal belongings. Patient was stable at discharge. Patient was accompanied to lobby by nursing staff.

## 2014-06-04 NOTE — ED Provider Notes (Signed)
Medical screening examination/treatment/procedure(s) were performed by non-physician practitioner and as supervising physician I was immediately available for consultation/collaboration.   EKG Interpretation   Date/Time:  Wednesday June 02 2014 07:27:54 EDT Ventricular Rate:  152 PR Interval:  114 QRS Duration: 86 QT Interval:  284 QTC Calculation: 452 R Axis:   89 Text Interpretation:  Sinus tachycardia Borderline repolarization  abnormality Baseline wander in lead(s) I II III aVR aVL aVF V3 V5  Confirmed by [facility]  MD, Rasool Rommel ([id number]) on 06/02/2014 10:54:52 AM       Shreyansh Tiffany Alfonso Patten, MD 06/04/14 [id number]

## 2014-12-21 ENCOUNTER — Emergency Department (HOSPITAL_COMMUNITY): Payer: Medicaid Other

## 2014-12-21 ENCOUNTER — Encounter (HOSPITAL_COMMUNITY): Payer: Self-pay | Admitting: Emergency Medicine

## 2014-12-21 ENCOUNTER — Emergency Department (HOSPITAL_COMMUNITY)
Admission: EM | Admit: 2014-12-21 | Discharge: 2014-12-21 | Disposition: A | Discharge status: Home / Self Care | Payer: Medicaid Other | Attending: Emergency Medicine | Admitting: Emergency Medicine

## 2014-12-21 DIAGNOSIS — Y9289 Other specified places as the place of occurrence of the external cause: Secondary | ICD-10-CM | POA: Insufficient documentation

## 2014-12-21 DIAGNOSIS — Z8669 Personal history of other diseases of the nervous system and sense organs: Secondary | ICD-10-CM | POA: Insufficient documentation

## 2014-12-21 DIAGNOSIS — S60221A Contusion of right hand, initial encounter: Secondary | ICD-10-CM

## 2014-12-21 DIAGNOSIS — Y998 Other external cause status: Secondary | ICD-10-CM | POA: Insufficient documentation

## 2014-12-21 DIAGNOSIS — S60031A Contusion of right middle finger without damage to nail, initial encounter: Secondary | ICD-10-CM | POA: Insufficient documentation

## 2014-12-21 DIAGNOSIS — Z8619 Personal history of other infectious and parasitic diseases: Secondary | ICD-10-CM | POA: Insufficient documentation

## 2014-12-21 DIAGNOSIS — W500XXA Accidental hit or strike by another person, initial encounter: Secondary | ICD-10-CM | POA: Insufficient documentation

## 2014-12-21 DIAGNOSIS — S60051A Contusion of right little finger without damage to nail, initial encounter: Secondary | ICD-10-CM | POA: Insufficient documentation

## 2014-12-21 DIAGNOSIS — Y9389 Activity, other specified: Secondary | ICD-10-CM | POA: Insufficient documentation

## 2014-12-21 DIAGNOSIS — S60041A Contusion of right ring finger without damage to nail, initial encounter: Secondary | ICD-10-CM | POA: Insufficient documentation

## 2014-12-21 DIAGNOSIS — Z87891 Personal history of nicotine dependence: Secondary | ICD-10-CM | POA: Insufficient documentation

## 2014-12-21 MED ORDER — NAPROXEN 500 MG PO TABS: 500.0000 mg | ORAL_TABLET | Freq: Two times a day (BID) | ORAL | Status: DC

## 2014-12-21 MED ORDER — TRAMADOL HCL 50 MG PO TABS: 50.0000 mg | ORAL_TABLET | Freq: Four times a day (QID) | ORAL | Status: DC | PRN

## 2014-12-21 NOTE — Discharge Instructions (Signed)
Hand Contusion A hand contusion is a deep bruise on your hand area. Contusions are the result of an injury that caused bleeding under the skin. The contusion may turn blue, purple, or yellow. Minor injuries will give you a painless contusion, but more severe contusions may stay painful and swollen for a few weeks. CAUSES  A contusion is usually caused by a blow, trauma, or direct force to an area of the body. SYMPTOMS   Swelling and redness of the injured area.  Discoloration of the injured area.  Tenderness and soreness of the injured area.  Pain. DIAGNOSIS  The diagnosis can be made by taking a history and performing a physical exam. An X-ray, CT scan, or MRI may be needed to determine if there were any associated injuries, such as broken bones (fractures). TREATMENT  Often, the best treatment for a hand contusion is resting, elevating, icing, and applying cold compresses to the injured area. Over-the-counter medicines may also be recommended for pain control. HOME CARE INSTRUCTIONS   Put ice on the injured area.  Put ice in a plastic bag.  Place a towel between your skin and the bag.  Leave the ice on for 15-20 minutes, 03-04 times a day.  Only take over-the-counter or prescription medicines as directed by your caregiver. Your caregiver may recommend avoiding anti-inflammatory medicines (aspirin, ibuprofen, and naproxen) for 48 hours because these medicines may increase bruising.  If told, use an elastic wrap as directed. This can help reduce swelling. You may remove the wrap for sleeping, showering, and bathing. If your fingers become numb, cold, or blue, take the wrap off and reapply it more loosely.  Elevate your hand with pillows to reduce swelling.  Avoid overusing your hand if it is painful. SEEK IMMEDIATE MEDICAL CARE IF:   You have increased redness, swelling, or pain in your hand.  Your swelling or pain is not relieved with medicines.  You have loss of feeling in  your hand or are unable to move your fingers.  Your hand turns cold or blue.  You have pain when you move your fingers.  Your hand becomes warm to the touch.  Your contusion does not improve in 2 days. MAKE SURE YOU:   Understand these instructions.  Will watch your condition.  Will get help right away if you are not doing well or get worse. Document Released: 04/27/2002 Document Revised: 07/30/2012 Document Reviewed: 04/28/2012 [facility] Patient Information 2015 [place], [place]. This information is not intended to replace advice given to you by your health care provider. Make sure you discuss any questions you have with your health care provider.  Cryotherapy Cryotherapy means treatment with cold. Ice or gel packs can be used to reduce both pain and swelling. Ice is the most helpful within the first 24 to 48 hours after an injury or flare-up from overusing a muscle or joint. Sprains, strains, spasms, burning pain, shooting pain, and aches can all be eased with ice. Ice can also be used when recovering from surgery. Ice is effective, has very few side effects, and is safe for most people to use. PRECAUTIONS  Ice is not a safe treatment option for people with:  Raynaud phenomenon. This is a condition affecting small blood vessels in the extremities. Exposure to cold may cause your problems to return.  Cold hypersensitivity. There are many forms of cold hypersensitivity, including:  Cold urticaria. Red, itchy hives appear on the skin when the tissues begin to warm after being iced.  Cold  erythema. This is a red, itchy rash caused by exposure to cold.  Cold hemoglobinuria. Red blood cells break down when the tissues begin to warm after being iced. The hemoglobin that carry oxygen are passed into the urine because they cannot combine with blood proteins fast enough.  Numbness or altered sensitivity in the area being iced. If you have any of the following conditions, do not use ice until  you have discussed cryotherapy with your caregiver:  Heart conditions, such as arrhythmia, angina, or chronic heart disease.  High blood pressure.  Healing wounds or open skin in the area being iced.  Current infections.  Rheumatoid arthritis.  Poor circulation.  Diabetes. Ice slows the blood flow in the region it is applied. This is beneficial when trying to stop inflamed tissues from spreading irritating chemicals to surrounding tissues. However, if you expose your skin to cold temperatures for too long or without the proper protection, you can damage your skin or nerves. Watch for signs of skin damage due to cold. HOME CARE INSTRUCTIONS Follow these tips to use ice and cold packs safely.  Place a dry or damp towel between the ice and skin. A damp towel will cool the skin more quickly, so you may need to shorten the time that the ice is used.  For a more rapid response, add gentle compression to the ice.  Ice for no more than 10 to 20 minutes at a time. The bonier the area you are icing, the less time it will take to get the benefits of ice.  Check your skin after 5 minutes to make sure there are no signs of a poor response to cold or skin damage.  Rest 20 minutes or more between uses.  Once your skin is numb, you can end your treatment. You can test numbness by very lightly touching your skin. The touch should be so light that you do not see the skin dimple from the pressure of your fingertip. When using ice, most people will feel these normal sensations in this order: cold, burning, aching, and numbness.  Do not use ice on someone who cannot communicate their responses to pain, such as small children or people with dementia. HOW TO MAKE AN ICE PACK Ice packs are the most common way to use ice therapy. Other methods include ice massage, ice baths, and cryosprays. Muscle creams that cause a cold, tingly feeling do not offer the same benefits that ice offers and should not be used  as a substitute unless recommended by your caregiver. To make an ice pack, do one of the following:  Place crushed ice or a bag of frozen vegetables in a sealable plastic bag. Squeeze out the excess air. Place this bag inside another plastic bag. Slide the bag into a pillowcase or place a damp towel between your skin and the bag.  Mix 3 parts water with 1 part rubbing alcohol. Freeze the mixture in a sealable plastic bag. When you remove the mixture from the freezer, it will be slushy. Squeeze out the excess air. Place this bag inside another plastic bag. Slide the bag into a pillowcase or place a damp towel between your skin and the bag. SEEK MEDICAL CARE IF:  You develop white spots on your skin. This may give the skin a blotchy (mottled) appearance.  Your skin turns blue or pale.  Your skin becomes waxy or hard.  Your swelling gets worse. MAKE SURE YOU:   Understand these instructions.  Will watch  your condition.  Will get help right away if you are not doing well or get worse. Document Released: 07/02/2011 Document Revised: 03/22/2014 Document Reviewed: 07/02/2011 [facility] Patient Information 2015 [place], [place]. This information is not intended to replace advice given to you by your health care provider. Make sure you discuss any questions you have with your health care provider.
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
## 2015-05-09 ENCOUNTER — Encounter (HOSPITAL_COMMUNITY): Payer: Self-pay

## 2015-05-09 ENCOUNTER — Emergency Department (HOSPITAL_COMMUNITY)
Admission: EM | Admit: 2015-05-09 | Discharge: 2015-05-09 | Disposition: A | Discharge status: Home / Self Care | Payer: Medicaid Other

## 2015-05-09 ENCOUNTER — Emergency Department (HOSPITAL_COMMUNITY)
Admission: EM | Admit: 2015-05-09 | Discharge: 2015-05-10 | Disposition: A | Discharge status: Home / Self Care | Payer: Medicaid Other | Attending: Emergency Medicine | Admitting: Emergency Medicine

## 2015-05-09 ENCOUNTER — Encounter (HOSPITAL_COMMUNITY): Payer: Self-pay | Admitting: Family Medicine

## 2015-05-09 ENCOUNTER — Emergency Department (HOSPITAL_COMMUNITY)
Admission: EM | Admit: 2015-05-09 | Discharge: 2015-05-09 | Discharge status: Against Medical Advice | Payer: Medicaid Other | Attending: Emergency Medicine | Admitting: Emergency Medicine

## 2015-05-09 DIAGNOSIS — Z8669 Personal history of other diseases of the nervous system and sense organs: Secondary | ICD-10-CM | POA: Insufficient documentation

## 2015-05-09 DIAGNOSIS — R112 Nausea with vomiting, unspecified: Secondary | ICD-10-CM | POA: Insufficient documentation

## 2015-05-09 DIAGNOSIS — Z3202 Encounter for pregnancy test, result negative: Secondary | ICD-10-CM | POA: Insufficient documentation

## 2015-05-09 DIAGNOSIS — Z87891 Personal history of nicotine dependence: Secondary | ICD-10-CM | POA: Insufficient documentation

## 2015-05-09 DIAGNOSIS — R1110 Vomiting, unspecified: Secondary | ICD-10-CM | POA: Insufficient documentation

## 2015-05-09 DIAGNOSIS — R630 Anorexia: Secondary | ICD-10-CM | POA: Insufficient documentation

## 2015-05-09 DIAGNOSIS — R1031 Right lower quadrant pain: Secondary | ICD-10-CM

## 2015-05-09 DIAGNOSIS — Z8619 Personal history of other infectious and parasitic diseases: Secondary | ICD-10-CM | POA: Insufficient documentation

## 2015-05-09 LAB — COMPREHENSIVE METABOLIC PANEL
ALT: 24 U/L (ref 14–54)
AST: 21 U/L (ref 15–41)
Albumin: 3.9 g/dL (ref 3.5–5.0)
Alkaline Phosphatase: 85 U/L (ref 38–126)
Anion gap: 7 (ref 5–15)
BILIRUBIN TOTAL: 0.8 mg/dL (ref 0.3–1.2)
BUN: 9 mg/dL (ref 6–20)
CHLORIDE: 106 mmol/L (ref 101–111)
CO2: 25 mmol/L (ref 22–32)
Calcium: 9 mg/dL (ref 8.9–10.3)
Creatinine, Ser: 0.99 mg/dL (ref 0.44–1.00)
GFR calc Af Amer: >60 mL/min (ref >60)
Glucose, Bld: 94 mg/dL (ref 65–99)
POTASSIUM: 3.8 mmol/L (ref 3.5–5.1)
Sodium: 138 mmol/L (ref 135–145)
Total Protein: 6.3 g/dL — ABNORMAL LOW (ref 6.5–8.1)

## 2015-05-09 LAB — CBC WITH DIFFERENTIAL/PLATELET
Basophils Absolute: 0 10*3/uL (ref 0.0–0.1)
Basophils Relative: 0 % (ref 0–1)
EOS ABS: 0.2 10*3/uL (ref 0.0–0.7)
EOS PCT: 2 % (ref 0–5)
HEMATOCRIT: 37.1 % (ref 36.0–46.0)
Hemoglobin: 12.7 g/dL (ref 12.0–15.0)
Lymphocytes Relative: 22 % (ref 12–46)
Lymphs Abs: 1.7 10*3/uL (ref 0.7–4.0)
MCH: 30.1 pg (ref 26.0–34.0)
MCHC: 34.2 g/dL (ref 30.0–36.0)
MCV: 87.9 fL (ref 78.0–100.0)
Monocytes Absolute: 0.6 10*3/uL (ref 0.1–1.0)
Monocytes Relative: 8 % (ref 3–12)
Neutro Abs: 5.2 10*3/uL (ref 1.7–7.7)
Neutrophils Relative %: 68 % (ref 43–77)
PLATELETS: 263 10*3/uL (ref 150–400)
RBC: 4.22 MIL/uL (ref 3.87–5.11)
RDW: 13.9 % (ref 11.5–15.5)
WBC: 7.7 10*3/uL (ref 4.0–10.5)

## 2015-05-09 MED ORDER — ONDANSETRON HCL 4 MG/2ML IJ SOLN
4.0000 mg | Freq: Once | INTRAMUSCULAR | Status: AC
  Administered 2015-05-10: 4 mg via INTRAVENOUS
  Filled 2015-05-09: qty 2

## 2015-05-09 MED ORDER — SODIUM CHLORIDE 0.9 % IV SOLN
1000.0000 mL | Freq: Once | INTRAVENOUS | Status: AC
  Administered 2015-05-10: 1000 mL via INTRAVENOUS

## 2015-05-09 NOTE — ED Notes (Signed)
Pt here for vomiting blood and RLQ pain that started yesterday. sts cold chills and fever.

## 2015-05-09 NOTE — ED Notes (Signed)
Pt complains of abdominal pain for three days, she states that she's very nauseated but not vomiting

## 2015-05-09 NOTE — ED Notes (Signed)
Patient was seen at [facility] around 5 pm today.

## 2015-05-10 ENCOUNTER — Emergency Department (HOSPITAL_COMMUNITY): Payer: Medicaid Other

## 2015-05-10 ENCOUNTER — Encounter (HOSPITAL_COMMUNITY): Payer: Self-pay

## 2015-05-10 LAB — URINALYSIS, ROUTINE W REFLEX MICROSCOPIC
Bilirubin Urine: NEGATIVE
Glucose, UA: NEGATIVE mg/dL
KETONES UR: NEGATIVE mg/dL
Nitrite: NEGATIVE
PROTEIN: NEGATIVE mg/dL
Specific Gravity, Urine: 1.036 — ABNORMAL HIGH (ref 1.005–1.030)
UROBILINOGEN UA: 1 mg/dL (ref 0.0–1.0)
pH: 6.5 (ref 5.0–8.0)

## 2015-05-10 LAB — URINE MICROSCOPIC-ADD ON

## 2015-05-10 LAB — PREGNANCY, URINE: Preg Test, Ur: NEGATIVE

## 2015-05-10 MED ORDER — ONDANSETRON HCL 4 MG/2ML IJ SOLN
4.0000 mg | Freq: Once | INTRAMUSCULAR | Status: AC
  Administered 2015-05-10: 4 mg via INTRAVENOUS
  Filled 2015-05-10: qty 2

## 2015-05-10 MED ORDER — POLYETHYLENE GLYCOL 3350 17 G PO PACK: 17.0000 g | PACK | Freq: Every day | ORAL | Status: DC

## 2015-05-10 MED ORDER — MORPHINE SULFATE 4 MG/ML IJ SOLN
4.0000 mg | Freq: Once | INTRAMUSCULAR | Status: AC
  Administered 2015-05-10: 4 mg via INTRAVENOUS
  Filled 2015-05-10: qty 1

## 2015-05-10 MED ORDER — IOHEXOL 300 MG/ML  SOLN
25.0000 mL | Freq: Once | INTRAMUSCULAR | Status: AC | PRN
  Administered 2015-05-10: 25 mL via ORAL

## 2015-05-10 MED ORDER — IOHEXOL 300 MG/ML  SOLN
100.0000 mL | Freq: Once | INTRAMUSCULAR | Status: AC | PRN
  Administered 2015-05-10: 100 mL via INTRAVENOUS

## 2015-05-10 NOTE — ED Notes (Signed)
Requested patient to urinate. Patient taken to the restroom to try to give an urine specimen

## 2015-05-10 NOTE — ED Provider Notes (Signed)
CSN: [id number]     Arrival date & time 05/09/15  2322 History   First MD Initiated Contact with Patient 05/10/15 0142     Chief Complaint  Patient presents with  . Abdominal Pain     (Consider location/radiation/quality/duration/timing/severity/associated sxs/prior Treatment) Patient is a 24 y.o. female presenting with abdominal pain. The history is provided by the patient. No language interpreter was used.  Abdominal Pain Pain location:  RLQ Associated symptoms: nausea and vomiting   Associated symptoms: no chills, no diarrhea, no dysuria and no fever   Associated symptoms comment:  RLQ pain since yesterday associated with nausea, vomiting, anorexia. No fever. She denies urinary symptoms, vaginal discharge, pelvic pain. She is currently menstruating. Pain is progressive. She reports increased pain when walking/taking steps.    Past Medical History  Diagnosis Date  . Otitis externa due to Pseudomonas aeruginosa 04/2014   History reviewed. No pertinent past surgical history. Family History  Problem Relation Age of Onset  . Crohn's disease Other    History  Substance Use Topics  . Smoking status: Former Smoker    Types: Cigarettes    Quit date: 03/03/2014  . Smokeless tobacco: Never Used  . Alcohol Use: Yes     Comment: social   OB History    No data available     Review of Systems  Constitutional: Positive for appetite change. Negative for fever and chills.  HENT: Negative.   Respiratory: Negative.   Cardiovascular: Negative.   Gastrointestinal: Positive for nausea, vomiting and abdominal pain. Negative for diarrhea.  Genitourinary: Negative.  Negative for dysuria.  Musculoskeletal: Negative.   Skin: Negative.   Neurological: Negative.       Allergies  Peanut-containing drug products and Food  Home Medications   Prior to Admission medications   Medication Sig Start Date End Date Taking? Authorizing Provider  EPINEPHrine 0.3 mg/0.3 mL IJ SOAJ injection  Inject 0.3 mg into the muscle once.    Historical Provider, MD   BP 126/71 mmHg  Pulse 54  Temp(Src) 99.1 F (37.3 C) (Oral)  Resp 20  Ht 5\' 10"  (1.778 m)  Wt 195 lb (88.451 kg)  BMI 27.98 kg/m2  SpO2 100%  LMP 05/02/2015 Physical Exam  Constitutional: She appears well-developed and well-nourished.  HENT:  Head: Normocephalic.  Neck: Normal range of motion. Neck supple.  Cardiovascular: Normal rate and regular rhythm.   Pulmonary/Chest: Effort normal and breath sounds normal.  Abdominal: Soft. Bowel sounds are normal. There is tenderness. There is no rebound and no guarding.  RLQ abdominal tenderness without guarding. ?Rebound tenderness. Soft abdomen.   Musculoskeletal: Normal range of motion.  Neurological: She is alert. No cranial nerve deficit.  Skin: Skin is warm and dry. No rash noted.  Psychiatric: She has a normal mood and affect.    ED Course  Procedures (including critical care time) Labs Review Labs Reviewed  URINALYSIS, ROUTINE W REFLEX MICROSCOPIC (NOT AT [facility]) - Abnormal; Notable for the following:    Color, Urine AMBER (*)    APPearance CLOUDY (*)    Specific Gravity, Urine 1.036 (*)    Hgb urine dipstick LARGE (*)    Leukocytes, UA SMALL (*)    All other components within normal limits  URINE MICROSCOPIC-ADD ON - Abnormal; Notable for the following:    Bacteria, UA FEW (*)    All other components within normal limits  PREGNANCY, URINE    Imaging Review No results found.   EKG Interpretation None  MDM   Final diagnoses:  None    1. Abdominal pain  Negative CT, lab studies. Patient appears stable for discharge home and can follow up with recommended primary care provider. Return precautions discussed.     Charlann Lange, PA-C 05/10/15 0413  Shanon Rosser, MD 05/10/15 [id number]

## 2015-05-10 NOTE — Discharge Instructions (Signed)

## 2015-05-24 ENCOUNTER — Encounter (HOSPITAL_COMMUNITY): Payer: Self-pay

## 2015-05-24 ENCOUNTER — Emergency Department (HOSPITAL_COMMUNITY): Payer: Medicaid Other

## 2015-05-24 ENCOUNTER — Emergency Department (HOSPITAL_COMMUNITY)
Admission: EM | Admit: 2015-05-24 | Discharge: 2015-05-24 | Disposition: A | Discharge status: Home / Self Care | Payer: Medicaid Other | Attending: Emergency Medicine | Admitting: Emergency Medicine

## 2015-05-24 DIAGNOSIS — Z87891 Personal history of nicotine dependence: Secondary | ICD-10-CM | POA: Insufficient documentation

## 2015-05-24 DIAGNOSIS — Y998 Other external cause status: Secondary | ICD-10-CM | POA: Insufficient documentation

## 2015-05-24 DIAGNOSIS — Y92310 Basketball court as the place of occurrence of the external cause: Secondary | ICD-10-CM | POA: Insufficient documentation

## 2015-05-24 DIAGNOSIS — Z8669 Personal history of other diseases of the nervous system and sense organs: Secondary | ICD-10-CM | POA: Insufficient documentation

## 2015-05-24 DIAGNOSIS — W1839XA Other fall on same level, initial encounter: Secondary | ICD-10-CM | POA: Insufficient documentation

## 2015-05-24 DIAGNOSIS — Y9367 Activity, basketball: Secondary | ICD-10-CM | POA: Insufficient documentation

## 2015-05-24 DIAGNOSIS — S80212A Abrasion, left knee, initial encounter: Secondary | ICD-10-CM | POA: Insufficient documentation

## 2015-05-24 DIAGNOSIS — Z79899 Other long term (current) drug therapy: Secondary | ICD-10-CM | POA: Insufficient documentation

## 2015-05-24 DIAGNOSIS — M25562 Pain in left knee: Secondary | ICD-10-CM

## 2015-05-24 MED ORDER — IBUPROFEN 800 MG PO TABS: 800.0000 mg | ORAL_TABLET | Freq: Three times a day (TID) | ORAL | Status: DC | PRN

## 2015-05-24 NOTE — ED Notes (Signed)
Pt c/o L knee pain and multiple abrasions after falling while playing basketball last night.  Pain score 9/10.  Pt reports taking and ibuprofen last night w/o relief.  Abrasions noted to R hand, L forearm, and L knee.

## 2015-05-24 NOTE — Discharge Instructions (Signed)
Read the information below.  Use the prescribed medication as directed.  Please discuss all new medications with your pharmacist.  You may return to the Emergency Department at any time for worsening condition or any new symptoms that concern you.  If there is any possibility that you might be pregnant, please let your health care provider know and discuss this with the pharmacist to ensure medication safety.  If you develop uncontrolled pain, weakness or numbness of the extremity, severe discoloration of the skin, or you are unable to move your knee or walk, return to the ER for a recheck.     Knee Pain The knee is the complex joint between your thigh and your lower leg. It is made up of bones, tendons, ligaments, and cartilage. The bones that make up the knee are:  The femur in the thigh.  The tibia and fibula in the lower leg.  The patella or kneecap riding in the groove on the lower femur. CAUSES  Knee pain is a common complaint with many causes. A few of these causes are:  Injury, such as:  A ruptured ligament or tendon injury.  Torn cartilage.  Medical conditions, such as:  Gout  Arthritis  Infections  Overuse, over training, or overdoing a physical activity. Knee pain can be minor or severe. Knee pain can accompany debilitating injury. Minor knee problems often respond well to self-care measures or get well on their own. More serious injuries may need medical intervention or even surgery. SYMPTOMS The knee is complex. Symptoms of knee problems can vary widely. Some of the problems are:  Pain with movement and weight bearing.  Swelling and tenderness.  Buckling of the knee.  Inability to straighten or extend your knee.  Your knee locks and you cannot straighten it.  Warmth and redness with pain and fever.  Deformity or dislocation of the kneecap. DIAGNOSIS  Determining what is wrong may be very straight forward such as when there is an injury. It can also be  challenging because of the complexity of the knee. Tests to make a diagnosis may include:  Your caregiver taking a history and doing a physical exam.  Routine X-rays can be used to rule out other problems. X-rays will not reveal a cartilage tear. Some injuries of the knee can be diagnosed by:  Arthroscopy a surgical technique by which a small video camera is inserted through tiny incisions on the sides of the knee. This procedure is used to examine and repair internal knee joint problems. Tiny instruments can be used during arthroscopy to repair the torn knee cartilage (meniscus).  Arthrography is a radiology technique. A contrast liquid is directly injected into the knee joint. Internal structures of the knee joint then become visible on X-ray film.  An MRI scan is a non X-ray radiology procedure in which magnetic fields and a computer produce two- or three-dimensional images of the inside of the knee. Cartilage tears are often visible using an MRI scanner. MRI scans have largely replaced arthrography in diagnosing cartilage tears of the knee.  Blood work.  Examination of the fluid that helps to lubricate the knee joint (synovial fluid). This is done by taking a sample out using a needle and a syringe. TREATMENT The treatment of knee problems depends on the cause. Some of these treatments are:  Depending on the injury, proper casting, splinting, surgery, or physical therapy care will be needed.  Give yourself adequate recovery time. Do not overuse your joints. If you begin  to get sore during workout routines, back off. Slow down or do fewer repetitions.  For repetitive activities such as cycling or running, maintain your strength and nutrition.  Alternate muscle groups. For example, if you are a weight lifter, work the upper body on one day and the lower body the next.  Either tight or weak muscles do not give the proper support for your knee. Tight or weak muscles do not absorb the stress  placed on the knee joint. Keep the muscles surrounding the knee strong.  Take care of mechanical problems.  If you have flat feet, orthotics or special shoes may help. See your caregiver if you need help.  Arch supports, sometimes with wedges on the inner or outer aspect of the heel, can help. These can shift pressure away from the side of the knee most bothered by osteoarthritis.  A brace called an "unloader" brace also may be used to help ease the pressure on the most arthritic side of the knee.  If your caregiver has prescribed crutches, braces, wraps or ice, use as directed. The acronym for this is PRICE. This means protection, rest, ice, compression, and elevation.  Nonsteroidal anti-inflammatory drugs (NSAIDs), can help relieve pain. But if taken immediately after an injury, they may actually increase swelling. Take NSAIDs with food in your stomach. Stop them if you develop stomach problems. Do not take these if you have a history of ulcers, stomach pain, or bleeding from the bowel. Do not take without your caregiver's approval if you have problems with fluid retention, heart failure, or kidney problems.  For ongoing knee problems, physical therapy may be helpful.  Glucosamine and chondroitin are over-the-counter dietary supplements. Both may help relieve the pain of osteoarthritis in the knee. These medicines are different from the usual anti-inflammatory drugs. Glucosamine may decrease the rate of cartilage destruction.  Injections of a corticosteroid drug into your knee joint may help reduce the symptoms of an arthritis flare-up. They may provide pain relief that lasts a few months. You may have to wait a few months between injections. The injections do have a small increased risk of infection, water retention, and elevated blood sugar levels.  Hyaluronic acid injected into damaged joints may ease pain and provide lubrication. These injections may work by reducing inflammation. A series  of shots may give relief for as long as 6 months.  Topical painkillers. Applying certain ointments to your skin may help relieve the pain and stiffness of osteoarthritis. Ask your pharmacist for suggestions. Many over the-counter products are approved for temporary relief of arthritis pain.  In some countries, doctors often prescribe topical NSAIDs for relief of chronic conditions such as arthritis and tendinitis. A review of treatment with NSAID creams found that they worked as well as oral medications but without the serious side effects. PREVENTION  Maintain a healthy weight. Extra pounds put more strain on your joints.  Get strong, stay limber. Weak muscles are a common cause of knee injuries. Stretching is important. Include flexibility exercises in your workouts.  Be smart about exercise. If you have osteoarthritis, chronic knee pain or recurring injuries, you may need to change the way you exercise. This does not mean you have to stop being active. If your knees ache after jogging or playing basketball, consider switching to swimming, water aerobics, or other low-impact activities, at least for a few days a week. Sometimes limiting high-impact activities will provide relief.  Make sure your shoes fit well. Choose footwear that is right  for your sport.  Protect your knees. Use the proper gear for knee-sensitive activities. Use kneepads when playing volleyball or laying carpet. Buckle your seat belt every time you drive. Most shattered kneecaps occur in car accidents.  Rest when you are tired. SEEK MEDICAL CARE IF:  You have knee pain that is continual and does not seem to be getting better.  SEEK IMMEDIATE MEDICAL CARE IF:  Your knee joint feels hot to the touch and you have a high fever. MAKE SURE YOU:   Understand these instructions.  Will watch your condition.  Will get help right away if you are not doing well or get worse. Document Released: 09/02/2007 Document Revised:  01/28/2012 Document Reviewed: 09/02/2007 [facility] Patient Information 2015 [place], [place]. This information is not intended to replace advice given to you by your health care provider. Make sure you discuss any questions you have with your health care provider.  Knee Bracing Knee braces are supports to help stabilize and protect an injured or painful knee. They come in many different styles. They should support and protect the knee without increasing the chance of other injuries to yourself or others. It is important not to have a false sense of security when using a brace. Knee braces that help you to keep using your knee:  Do not restore normal knee stability under high stress forces.  May decrease some aspects of athletic performance. Some of the different types of knee braces are:  Prophylactic knee braces are designed to prevent or reduce the severity of knee injuries during sports that make injury to the knee more likely.  Rehabilitative knee braces are designed to allow protected motion of:  Injured knees.  Knees that have been treated with or without surgery. There is no evidence that the use of a supportive knee brace protects the graft following a successful anterior cruciate ligament (ACL) reconstruction. However, braces are sometimes used to:   Protect injured ligaments.  Control knee movement during the initial healing period. They may be used as part of the treatment program for the various injured ligaments or cartilage of the knee including the:  Anterior cruciate ligament.  Medial collateral ligament.  Medial or lateral cartilage (meniscus).  Posterior cruciate ligament.  Lateral collateral ligament. Rehabilitative knee braces are most commonly used:  During crutch-assisted walking right after injury.  During crutch-assisted walking right after surgery to repair the cartilage and/or cruciate ligament injury.  For a short period of time, 2-8 weeks, after the  injury or surgery. The value of a rehabilitative brace as opposed to a cast or splint includes the:  Ability to adjust the brace for swelling.  Ability to remove the brace for examinations, icing, or showering.  Ability to allow for movement in a controlled range of motion. Functional knee braces give support to knees that have already been injured. They are designed to provide stability for the injured knee and provide protection after repair. Functional knee braces may not affect performance much. Lower extremity muscle strengthening, flexibility, and improvement in technique are more important than bracing in treating ligamentous knee injuries. Functional braces are not a substitute for rehabilitation or surgical procedures. Unloader/off-loader braces are designed to provide pain relief in arthritic knees. Patients with wear and tear arthritis from growing old or from an old cartilage injury (osteoarthritis) of the knee, and bowlegged (varus) or knock-knee (valgus) deformities, often develop increased pain in the arthritic side due to increased loading. Unloader/off-loader braces are made to reduce uneven loading in  such knees. There is reduction in bowing out movement in bowlegged knees when the correct unloader brace is used. Patients with advanced osteoarthritis or severe varus or valgus alignment problems would not likely benefit from bracing. Patellofemoral braces help the kneecap to move smoothly and well centered over the end of the femur in the knee.  Most people who wear knee braces feel that they help. However, there is a lack of scientific evidence that knee braces are helpful at the level needed for athletic participation to prevent injury. In spite of this, athletes report an increase in knee stability, pain relief, performance improvement, and confidence during athletics when using a brace.  Different knee problems require different knee braces:  Your caregiver may suggest one kind of  knee brace after knee surgery.  A caregiver may choose another kind of knee brace for support instead of surgery for some types of torn ligaments.  You may also need one for pain in the front of your knee that is not getting better with strengthening and flexibility exercises. Get your caregiver's advice if you want to try a knee brace. The caregiver will advise you on where to get them and provide a prescription when it is needed to fashion and/or fit the brace. Knee braces are the least important part of preventing knee injuries or getting better following injury. Stretching, strengthening and technique improvement are far more important in caring for and preventing knee injuries. When strengthening your knee, increase your activities a little at a time so as not to develop injuries from overuse. Work out an exercise plan with your caregiver and/or physical therapist to get the best program for you. Do not let a knee brace become a crutch. Always remember, there are no braces which support the knee as well as your original ligaments and cartilage you were born with. Conditioning, proper warm-up, and stretching remain the most important parts of keeping your knees healthy. HOW TO USE A KNEE BRACE  During sports, knee braces should be used as directed by your caregiver.  Make sure that the hinges are where the knee bends.  Straps, tapes, or hook-and-loop tapes should be fastened around your leg as instructed.  You should check the placement of the brace during activities to make sure that it has not moved. Poorly positioned braces can hurt rather than help you.  To work well, a knee brace should be worn during all activities that put you at risk of knee injury.  Warm up properly before beginning athletic activities. HOME CARE INSTRUCTIONS  Knee braces often get damaged during normal use. Replace worn-out braces for maximum benefit.  Clean regularly with soap and water.  Inspect your brace  often for wear and tear.  Cover exposed metal to protect others from injury.  Durable materials may cost more, but last longer. SEEK IMMEDIATE MEDICAL CARE IF:   Your knee seems to be getting worse rather than better.  You have increasing pain or swelling in the knee.  You have problems caused by the knee brace.  You have increased swelling or inflammation (redness or soreness) in your knee.  Your knee becomes warm and more painful and you develop an unexplained temperature over 101F (38.3C). MAKE SURE YOU:   Understand these instructions.  Will watch your condition.  Will get help right away if you are not doing well or get worse. See your caregiver, physical therapist, or orthopedic surgeon for additional information. Document Released: 01/26/2004 Document Revised: 03/22/2014 Document Reviewed: 05/04/2009  [employer]® Patient Information ©2015 [employer]. This information is not intended to replace advice given to you by your health care provider. Make sure you discuss any questions you have with your health care provider. ° °
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
## 2015-10-23 ENCOUNTER — Emergency Department (HOSPITAL_COMMUNITY): Payer: Medicaid Other

## 2015-10-23 ENCOUNTER — Encounter (HOSPITAL_COMMUNITY): Payer: Self-pay | Admitting: Emergency Medicine

## 2015-10-23 ENCOUNTER — Inpatient Hospital Stay (HOSPITAL_COMMUNITY)
Admission: EM | Admit: 2015-10-23 | Discharge: 2015-10-29 | DRG: 872 | Disposition: A | Discharge status: Home / Self Care | Payer: Self-pay | Attending: Internal Medicine | Admitting: Internal Medicine

## 2015-10-23 DIAGNOSIS — E669 Obesity, unspecified: Secondary | ICD-10-CM | POA: Diagnosis present

## 2015-10-23 DIAGNOSIS — Q969 Turner's syndrome, unspecified: Secondary | ICD-10-CM

## 2015-10-23 DIAGNOSIS — Z8379 Family history of other diseases of the digestive system: Secondary | ICD-10-CM

## 2015-10-23 DIAGNOSIS — R109 Unspecified abdominal pain: Secondary | ICD-10-CM | POA: Diagnosis present

## 2015-10-23 DIAGNOSIS — Z91010 Allergy to peanuts: Secondary | ICD-10-CM

## 2015-10-23 DIAGNOSIS — F329 Major depressive disorder, single episode, unspecified: Secondary | ICD-10-CM | POA: Diagnosis present

## 2015-10-23 DIAGNOSIS — N926 Irregular menstruation, unspecified: Secondary | ICD-10-CM | POA: Diagnosis present

## 2015-10-23 DIAGNOSIS — E440 Moderate protein-calorie malnutrition: Secondary | ICD-10-CM | POA: Insufficient documentation

## 2015-10-23 DIAGNOSIS — K5090 Crohn's disease, unspecified, without complications: Secondary | ICD-10-CM | POA: Diagnosis present

## 2015-10-23 DIAGNOSIS — R1031 Right lower quadrant pain: Secondary | ICD-10-CM

## 2015-10-23 DIAGNOSIS — D649 Anemia, unspecified: Secondary | ICD-10-CM | POA: Diagnosis present

## 2015-10-23 DIAGNOSIS — G43A0 Cyclical vomiting, not intractable: Secondary | ICD-10-CM

## 2015-10-23 DIAGNOSIS — B370 Candidal stomatitis: Secondary | ICD-10-CM | POA: Diagnosis present

## 2015-10-23 DIAGNOSIS — R112 Nausea with vomiting, unspecified: Secondary | ICD-10-CM | POA: Diagnosis present

## 2015-10-23 DIAGNOSIS — K567 Ileus, unspecified: Secondary | ICD-10-CM | POA: Diagnosis present

## 2015-10-23 DIAGNOSIS — K529 Noninfective gastroenteritis and colitis, unspecified: Secondary | ICD-10-CM

## 2015-10-23 DIAGNOSIS — R42 Dizziness and giddiness: Secondary | ICD-10-CM | POA: Diagnosis present

## 2015-10-23 DIAGNOSIS — F1210 Cannabis abuse, uncomplicated: Secondary | ICD-10-CM | POA: Diagnosis present

## 2015-10-23 DIAGNOSIS — Z87891 Personal history of nicotine dependence: Secondary | ICD-10-CM

## 2015-10-23 DIAGNOSIS — F1910 Other psychoactive substance abuse, uncomplicated: Secondary | ICD-10-CM | POA: Diagnosis present

## 2015-10-23 DIAGNOSIS — A419 Sepsis, unspecified organism: Principal | ICD-10-CM | POA: Diagnosis present

## 2015-10-23 DIAGNOSIS — F32A Depression, unspecified: Secondary | ICD-10-CM | POA: Diagnosis present

## 2015-10-23 DIAGNOSIS — D259 Leiomyoma of uterus, unspecified: Secondary | ICD-10-CM | POA: Diagnosis present

## 2015-10-23 DIAGNOSIS — H8110 Benign paroxysmal vertigo, unspecified ear: Secondary | ICD-10-CM

## 2015-10-23 DIAGNOSIS — N83201 Unspecified ovarian cyst, right side: Secondary | ICD-10-CM

## 2015-10-23 DIAGNOSIS — Z6830 Body mass index (BMI) 30.0-30.9, adult: Secondary | ICD-10-CM

## 2015-10-23 HISTORY — DX: Major depressive disorder, single episode, unspecified: F32.9

## 2015-10-23 HISTORY — DX: Depression, unspecified: F32.A

## 2015-10-23 LAB — URINE MICROSCOPIC-ADD ON

## 2015-10-23 LAB — TYPE AND SCREEN
ABO/RH(D): A POS
ANTIBODY SCREEN: NEGATIVE

## 2015-10-23 LAB — RAPID URINE DRUG SCREEN, HOSP PERFORMED
AMPHETAMINES: NOT DETECTED
BENZODIAZEPINES: NOT DETECTED
Barbiturates: NOT DETECTED
COCAINE: NOT DETECTED
OPIATES: POSITIVE — AB
Tetrahydrocannabinol: POSITIVE — AB

## 2015-10-23 LAB — LIPASE, BLOOD: LIPASE: 21 U/L (ref 11–51)

## 2015-10-23 LAB — URINALYSIS, ROUTINE W REFLEX MICROSCOPIC
Bilirubin Urine: NEGATIVE
GLUCOSE, UA: NEGATIVE mg/dL
Ketones, ur: 40 mg/dL — AB
LEUKOCYTES UA: NEGATIVE
Nitrite: NEGATIVE
PROTEIN: NEGATIVE mg/dL
pH: 5.5 (ref 5.0–8.0)

## 2015-10-23 LAB — COMPREHENSIVE METABOLIC PANEL
ALT: 20 U/L (ref 14–54)
AST: 19 U/L (ref 15–41)
Albumin: 3.8 g/dL (ref 3.5–5.0)
Alkaline Phosphatase: 59 U/L (ref 38–126)
Anion gap: 9 (ref 5–15)
BUN: 7 mg/dL (ref 6–20)
CO2: 22 mmol/L (ref 22–32)
Calcium: 9 mg/dL (ref 8.9–10.3)
Chloride: 106 mmol/L (ref 101–111)
Creatinine, Ser: 0.84 mg/dL (ref 0.44–1.00)
GFR calc Af Amer: >60 mL/min (ref >60)
Glucose, Bld: 110 mg/dL — ABNORMAL HIGH (ref 65–99)
Potassium: 3.8 mmol/L (ref 3.5–5.1)
Sodium: 137 mmol/L (ref 135–145)
Total Bilirubin: 1.4 mg/dL — ABNORMAL HIGH (ref 0.3–1.2)
Total Protein: 6.2 g/dL — ABNORMAL LOW (ref 6.5–8.1)

## 2015-10-23 LAB — CBC
HCT: 33.7 % — ABNORMAL LOW (ref 36.0–46.0)
HEMOGLOBIN: 11.3 g/dL — AB (ref 12.0–15.0)
MCH: 29.8 pg (ref 26.0–34.0)
MCHC: 33.5 g/dL (ref 30.0–36.0)
MCV: 88.9 fL (ref 78.0–100.0)
Platelets: 335 10*3/uL (ref 150–400)
RBC: 3.79 MIL/uL — ABNORMAL LOW (ref 3.87–5.11)
RDW: 15 % (ref 11.5–15.5)
WBC: 12.2 10*3/uL — AB (ref 4.0–10.5)

## 2015-10-23 LAB — PROTIME-INR
INR: 1.65 — ABNORMAL HIGH (ref 0.00–1.49)
Prothrombin Time: 19.5 seconds — ABNORMAL HIGH (ref 11.6–15.2)

## 2015-10-23 LAB — LACTIC ACID, PLASMA: Lactic Acid, Venous: 0.9 mmol/L (ref 0.5–2.0)

## 2015-10-23 LAB — APTT: aPTT: 32 seconds (ref 24–37)

## 2015-10-23 LAB — I-STAT BETA HCG BLOOD, ED (MC, WL, AP ONLY): I-stat hCG, quantitative: <5 m[IU]/mL (ref <5)

## 2015-10-23 LAB — PROCALCITONIN: PROCALCITONIN: 2.12 ng/mL

## 2015-10-23 MED ORDER — METRONIDAZOLE IN NACL 5-0.79 MG/ML-% IV SOLN
500.0000 mg | Freq: Once | INTRAVENOUS | Status: AC
  Administered 2015-10-23: 500 mg via INTRAVENOUS
  Filled 2015-10-23: qty 100

## 2015-10-23 MED ORDER — IOHEXOL 300 MG/ML  SOLN
100.0000 mL | Freq: Once | INTRAMUSCULAR | Status: AC | PRN
  Administered 2015-10-23: 100 mL via INTRAVENOUS

## 2015-10-23 MED ORDER — HYDROMORPHONE HCL 1 MG/ML IJ SOLN
0.5000 mg | Freq: Once | INTRAMUSCULAR | Status: AC
  Administered 2015-10-23: 0.5 mg via INTRAVENOUS
  Filled 2015-10-23: qty 1

## 2015-10-23 MED ORDER — METRONIDAZOLE IN NACL 5-0.79 MG/ML-% IV SOLN
500.0000 mg | Freq: Three times a day (TID) | INTRAVENOUS | Status: DC
  Administered 2015-10-23 – 2015-10-24 (×2): 500 mg via INTRAVENOUS
  Filled 2015-10-23 (×2): qty 100

## 2015-10-23 MED ORDER — SODIUM CHLORIDE 0.9 % IV BOLUS (SEPSIS)
1000.0000 mL | Freq: Once | INTRAVENOUS | Status: AC
  Administered 2015-10-23: 1000 mL via INTRAVENOUS

## 2015-10-23 MED ORDER — ACETAMINOPHEN 325 MG PO TABS: 650.0000 mg | ORAL_TABLET | Freq: Once | ORAL | Status: DC

## 2015-10-23 MED ORDER — ONDANSETRON HCL 4 MG/2ML IJ SOLN
4.0000 mg | Freq: Once | INTRAMUSCULAR | Status: AC
  Administered 2015-10-23: 4 mg via INTRAVENOUS
  Filled 2015-10-23: qty 2

## 2015-10-23 MED ORDER — CIPROFLOXACIN IN D5W 400 MG/200ML IV SOLN
400.0000 mg | Freq: Two times a day (BID) | INTRAVENOUS | Status: DC
  Administered 2015-10-23 – 2015-10-24 (×2): 400 mg via INTRAVENOUS
  Filled 2015-10-23 (×2): qty 200

## 2015-10-23 MED ORDER — ACETAMINOPHEN 500 MG PO TABS
1000.0000 mg | ORAL_TABLET | Freq: Once | ORAL | Status: AC
  Administered 2015-10-23: 1000 mg via ORAL
  Filled 2015-10-23: qty 2

## 2015-10-23 MED ORDER — SODIUM CHLORIDE 0.9 % IV SOLN: Freq: Once | INTRAVENOUS | Status: DC

## 2015-10-23 MED ORDER — SODIUM CHLORIDE 0.9 % IJ SOLN
3.0000 mL | Freq: Two times a day (BID) | INTRAMUSCULAR | Status: DC
  Administered 2015-10-24 – 2015-10-29 (×5): 3 mL via INTRAVENOUS

## 2015-10-23 MED ORDER — MORPHINE SULFATE (PF) 4 MG/ML IV SOLN
3.0000 mg | INTRAVENOUS | Status: DC | PRN
  Administered 2015-10-23: 3 mg via INTRAVENOUS
  Filled 2015-10-23: qty 1

## 2015-10-23 MED ORDER — KETOROLAC TROMETHAMINE 30 MG/ML IJ SOLN
30.0000 mg | Freq: Three times a day (TID) | INTRAMUSCULAR | Status: AC | PRN
  Administered 2015-10-24 – 2015-10-26 (×7): 30 mg via INTRAVENOUS
  Filled 2015-10-23 (×7): qty 1

## 2015-10-23 MED ORDER — FAMOTIDINE IN NACL 20-0.9 MG/50ML-% IV SOLN
20.0000 mg | Freq: Two times a day (BID) | INTRAVENOUS | Status: DC
  Administered 2015-10-24 – 2015-10-27 (×8): 20 mg via INTRAVENOUS
  Filled 2015-10-23 (×9): qty 50

## 2015-10-23 MED ORDER — ONDANSETRON HCL 4 MG/2ML IJ SOLN
4.0000 mg | Freq: Three times a day (TID) | INTRAMUSCULAR | Status: DC | PRN
Start: 2015-10-23 — End: 2015-10-29
  Administered 2015-10-26 – 2015-10-28 (×5): 4 mg via INTRAVENOUS
  Filled 2015-10-23 (×5): qty 2

## 2015-10-23 MED ORDER — MORPHINE SULFATE (PF) 4 MG/ML IV SOLN
3.0000 mg | INTRAVENOUS | Status: DC | PRN
  Administered 2015-10-24 – 2015-10-29 (×33): 3 mg via INTRAVENOUS
  Filled 2015-10-23 (×34): qty 1

## 2015-10-23 MED ORDER — SODIUM CHLORIDE 0.9 % IV SOLN
INTRAVENOUS | Status: DC
  Administered 2015-10-24 – 2015-10-27 (×9): via INTRAVENOUS
  Administered 2015-10-27: 200 mL via INTRAVENOUS

## 2015-10-23 NOTE — Consult Note (Signed)
General Surgery [facility]  Reason for Consult: abdominal pain, enteritis  Referring Physician:  Dr. Blaine Hamper, [facility]  Maria Sampson is an 24 y.o. female.  HPI: patient is a 24 yo BF with 2 day hx of abdominal pain increasing in severity.  Patient notes nausea, vomiting and diarrhea.  Denies BRBPR.  Denies previous surgery.  No unusual meals.  No one else ill at home.  No hx of vascular disease.  Family hx of Crohn's.  Eval in ER with increased temp greater than 101 degrees.  WBC 12.2.  Lytes normal.  LFT's normal.  Lactate not checked.  CT scan abd shows edema/thickening in mid small bowel loops consistent with enteritis with ileus.  General surgery asked to evaluate and follow.  Past Medical History  Diagnosis Date  . Otitis externa due to Pseudomonas aeruginosa 04/2014  . Depression     History reviewed. No pertinent past surgical history.  Family History  Problem Relation Age of Onset  . Crohn's disease Other     Social History:  reports that she quit smoking about 19 months ago. Her smoking use included Cigarettes. She has never used smokeless tobacco. She reports that she drinks alcohol. She reports that she does not use illicit drugs.  Allergies:  Allergies  Allergen Reactions  . Peanut-Containing Drug Products Anaphylaxis and Hives  . Food Hives, Itching and Swelling    PEANUTS    Medications: I have reviewed the patient's current medications.  Results for orders placed or performed during the hospital encounter of 10/23/15 (from the past 48 hour(s))  Lipase, blood     Status: None   Collection Time: 10/23/15  3:49 PM  Result Value Ref Range   Lipase 21 11 - 51 U/L  Comprehensive metabolic panel     Status: Abnormal   Collection Time: 10/23/15  3:49 PM  Result Value Ref Range   Sodium 137 135 - 145 mmol/L   Potassium 3.8 3.5 - 5.1 mmol/L   Chloride 106 101 - 111 mmol/L   CO2 22 22 - 32 mmol/L   Glucose, Bld 110 (H) 65 - 99 mg/dL    BUN 7 6 - 20 mg/dL   Creatinine, Ser 0.84 0.44 - 1.00 mg/dL   Calcium 9.0 8.9 - 10.3 mg/dL   Total Protein 6.2 (L) 6.5 - 8.1 g/dL   Albumin 3.8 3.5 - 5.0 g/dL   AST 19 15 - 41 U/L   ALT 20 14 - 54 U/L   Alkaline Phosphatase 59 38 - 126 U/L   Total Bilirubin 1.4 (H) 0.3 - 1.2 mg/dL   GFR calc non Af Amer >60 >60 mL/min   GFR calc Af Amer >60 >60 mL/min    Comment: (NOTE) The eGFR has been calculated using the CKD EPI equation. This calculation has not been validated in all clinical situations. eGFR's persistently <60 mL/min signify possible Chronic Kidney Disease.    Anion gap 9 5 - 15  CBC     Status: Abnormal   Collection Time: 10/23/15  3:49 PM  Result Value Ref Range   WBC 12.2 (H) 4.0 - 10.5 K/uL   RBC 3.79 (L) 3.87 - 5.11 MIL/uL   Hemoglobin 11.3 (L) 12.0 - 15.0 g/dL   HCT 33.7 (L) 36.0 - 46.0 %   MCV 88.9 78.0 - 100.0 fL   MCH 29.8 26.0 - 34.0 pg   MCHC 33.5 30.0 - 36.0 g/dL   RDW 15.0 11.5 - 15.5 %  Platelets 335 150 - 400 K/uL  I-Stat beta hCG blood, ED ([facility], [facility], [facility] only)     Status: None   Collection Time: 10/23/15  3:57 PM  Result Value Ref Range   I-stat hCG, quantitative <5.0 <5 mIU/mL   Comment 3            Comment:   GEST. AGE      CONC.  (mIU/mL)   <=1 WEEK        5 - 50     2 WEEKS       50 - 500     3 WEEKS       100 - 10,000     4 WEEKS     1,000 - 30,000        FEMALE AND NON-PREGNANT FEMALE:     LESS THAN 5 mIU/mL   Urinalysis, Routine w reflex microscopic (not at [facility])     Status: Abnormal   Collection Time: 10/23/15  8:35 PM  Result Value Ref Range   Color, Urine AMBER (A) YELLOW    Comment: BIOCHEMICALS MAY BE AFFECTED BY COLOR   APPearance CLEAR CLEAR   Specific Gravity, Urine >1.046 (H) 1.005 - 1.030   pH 5.5 5.0 - 8.0   Glucose, UA NEGATIVE NEGATIVE mg/dL   Hgb urine dipstick MODERATE (A) NEGATIVE   Bilirubin Urine NEGATIVE NEGATIVE   Ketones, ur 40 (A) NEGATIVE mg/dL   Protein, ur NEGATIVE NEGATIVE mg/dL   Nitrite NEGATIVE NEGATIVE    Leukocytes, UA NEGATIVE NEGATIVE  Urine microscopic-add on     Status: Abnormal   Collection Time: 10/23/15  8:35 PM  Result Value Ref Range   Squamous Epithelial / LPF 0-5 (A) NONE SEEN   WBC, UA 0-5 0 - 5 WBC/hpf   RBC / HPF 6-30 0 - 5 RBC/hpf   Bacteria, UA RARE (A) NONE SEEN   Urine-Other MUCOUS PRESENT     Ct Abdomen Pelvis W Contrast  10/23/2015  CLINICAL DATA:  Right lower quadrant abdominal pain, nausea and vomiting for the past 2 days. Irregular vaginal bleeding this past week. EXAM: CT ABDOMEN AND PELVIS WITH CONTRAST TECHNIQUE: Multidetector CT imaging of the abdomen and pelvis was performed using the standard protocol following bolus administration of intravenous contrast. CONTRAST:  155mL OMNIPAQUE IOHEXOL 300 MG/ML  SOLN COMPARISON:  Previous examinations, the most recent dated 05/10/2015. FINDINGS: Lower chest: 4 mm subpleural nodule in the left lower lobe on image 6. This unchanged since 05/01/2011, compatible with a benign nodule. Hepatobiliary: Again demonstrated is a gallbladder Phrygian cap. This contains lower density fluid than the remainder of the gallbladder. Normal appearing liver. Pancreas: No mass, inflammatory changes, or other significant abnormality. Spleen: Within normal limits in size and appearance. Adrenals/Urinary Tract: No masses identified. No evidence of hydronephrosis. Stomach/Bowel: Multiple dilated loops of jejunum in the mid abdomen. One of these demonstrates diffuse wall thickening on image number 45 of series 2. Normal caliber distal small bowel and colon. No gastric dilatation. No obstructing mass or hernia seen. Vascular/Lymphatic: No pathologically enlarged lymph nodes. No evidence of abdominal aortic aneurysm. Reproductive: Retroflexed uterus with the previously noted anterior uterine mass. The mass currently measures 3.1 x 2.6 cm on sagittal image number 67. Other: Small amount of free peritoneal fluid in the pelvic cul-de-sac, within normal limits of  physiological fluid. Musculoskeletal:  Normal appearing bones. IMPRESSION: 1. Mild ileus or partial obstruction involving loops of jejunum in the mid abdomen. One of these demonstrates mild diffuse wall  thickening, possibly due to infectious or inflammatory enteritis. 2. Probable sludge in the gallbladder, sparing the fluid in the Phrygian cap. 3. 3.1 x 2.6 cm uterine fibroid. Electronically Signed   By: Claudie Revering M.D.   On: 10/23/2015 19:45    Review of Systems  Constitutional: Positive for fever and diaphoresis.  HENT: Negative.   Eyes: Negative.   Respiratory: Negative.   Cardiovascular: Negative.   Gastrointestinal: Positive for nausea, vomiting, abdominal pain (central) and diarrhea. Negative for constipation, blood in stool and melena.  Genitourinary: Negative.   Musculoskeletal: Negative.   Skin: Negative.   Neurological: Negative.   Endo/Heme/Allergies: Negative.   Psychiatric/Behavioral: Negative.    Blood pressure 123/66, pulse 93, temperature 101.2 F (38.4 C), temperature source Oral, resp. rate 16, last menstrual period 10/16/2015, SpO2 98 %. Physical Exam  Constitutional: She is oriented to person, place, and time. She appears well-developed and well-nourished. She appears distressed.  HENT:  Head: Normocephalic and atraumatic.  Right Ear: External ear normal.  Left Ear: External ear normal.  Eyes: Conjunctivae are normal. Pupils are equal, round, and reactive to light. No scleral icterus.  Neck: Normal range of motion. Neck supple. No thyromegaly present.  Cardiovascular: Normal rate, regular rhythm and normal heart sounds.   No murmur heard. Respiratory: Effort normal and breath sounds normal. No respiratory distress. She has no wheezes.  GI: Soft. She exhibits no distension and no mass. There is tenderness (mainly central abdomen, poorly localized). There is guarding. There is no rebound.  quiet  Musculoskeletal: Normal range of motion. She exhibits no edema or  tenderness.  Neurological: She is alert and oriented to person, place, and time.  Skin: Skin is warm and dry. She is not diaphoretic.  Psychiatric: She has a normal mood and affect. Her behavior is normal.  anxious    Assessment/Plan: Abdominal pain, likely acute enteritis  Etiology not clear - infectious versus ischemic (unlikely)  Will check lactate level  Empiric abx per medical service  NPO, IV hydration  Rx pain, nausea  No acute surgical findings  Will follow with you  Earnstine Regal, MD, [facility] Office: [place] 10/23/2015, 10:29 PM

## 2015-10-23 NOTE — ED Notes (Signed)
Pt found laying in the floor, in front of the vending machines.  Sts she laid down because "it hurts to move."  This RN and Abbie RN assisted the Pt back into the wheelchair.

## 2015-10-23 NOTE — H&P (Addendum)
[facility] History and Physical  Maria Sampson [id number] DOB: 1991/08/19 DOA: 10/23/2015  Referring physician: ED physician PCP: Pcp Not In System  Specialists:   Chief Complaint: Nausea, vomiting, abdominal pain, vaginal bleeding  HPI: Maria Sampson is a 24 y.o. female with PMH of depression, appendicitis X Turner, who presents with nausea, vomiting, abdominal pain, vaginal bleeding.  Patient reports that she has been having nausea, vomiting, abdominal pain in the past 2 days. She also has fever and chills. She vomited 3 times without blood in the vomitus. She does not have diarrhea. Her abdominal pain is located in the right lower quadrant, constant, 10 out of 10 in severity, sharp, nonradiating. Patient does not have chest pain, cough, shortness of breath, symptoms of a UTI or unilateral weakness. Patient reports that she has irregular vaginal bleeding today. She had menstrual period last week.  In ED, patient was found to have WBC 12.2, lipase 21, negative urinalysis, temperature 101.2, heart rate 90-100, electrolytes and renal function okay. CT-abdomen/pelvis that showed mild ileus or partial obstruction involving loops of jejunum in the mid abdomen. One of these demonstrates mild diffuse wall thickening, possibly due to infectious or inflammatory enteritis, probable sludge in the gallbladder, sparing the fluid in the phrygian cap and 3. 3.1 x 2.6 cm uterine fibroid.  Where does patient live?   At home  Can patient participate in ADLs?  Yes   Review of Systems:   General: has fevers, chills, no changes in body weight, has poor appetite, has fatigue HEENT: no blurry vision, hearing changes or sore throat Pulm: no dyspnea, coughing, wheezing CV: no chest pain, palpitations Abd: has nausea, vomiting, abdominal pain, no diarrhea, constipation GU: no dysuria, burning on urination, increased urinary frequency, hematuria . Has vaginal bleeding.  Ext: no leg edema Neuro: no  unilateral weakness, numbness, or tingling, no vision change or hearing loss Skin: no rash MSK: No muscle spasm, no deformity, no limitation of range of movement in spin Heme: No easy bruising.  Travel history: No recent long distant travel.  Allergy:  Allergies  Allergen Reactions  . Peanut-Containing Drug Products Anaphylaxis and Hives  . Food Hives, Itching and Swelling    PEANUTS    Past Medical History  Diagnosis Date  . Otitis externa due to Pseudomonas aeruginosa 04/2014  . Depression     History reviewed. No pertinent past surgical history.  Social History:  reports that she quit smoking about 19 months ago. Her smoking use included Cigarettes. She has never used smokeless tobacco. She reports that she drinks alcohol. She reports that she does not use illicit drugs.  Family History:  Family History  Problem Relation Age of Onset  . Crohn's disease Other      Prior to Admission medications   Medication Sig Start Date End Date Taking? Authorizing Provider  ibuprofen (ADVIL,MOTRIN) 800 MG tablet Take 1 tablet (800 mg total) by mouth every 8 (eight) hours as needed for mild pain or moderate pain. Patient not taking: Reported on 10/23/2015 05/24/15   Clayton Bibles, PA-C  polyethylene glycol [facility]) packet Take 17 g by mouth daily. Patient not taking: Reported on 05/24/2015 05/10/15   Charlann Lange, PA-C    Physical Exam: Filed Vitals:   10/23/15 1528 10/23/15 1814 10/23/15 2021 10/23/15 2023  BP: 127/86 109/70 123/66   Pulse: 105 88 93   Temp: 99.2 F (37.3 C) 99.7 F (37.6 C)  101.2 F (38.4 C)  TempSrc: Oral Oral  Oral  Resp:  18 20 16    SpO2: 100% 99% 98%    General: Not in acute distress HEENT:       Eyes: PERRL, EOMI, no scleral icterus.       ENT: No discharge from the ears and nose, no pharynx injection, no tonsillar enlargement.        Neck: No JVD, no bruit, no mass felt. Heme: No neck lymph node enlargement. Cardiac: S1/S2, RRR, No murmurs, No gallops or  rubs. Pulm: No rales, wheezing, rhonchi or rubs. Abd: Soft, nondistended, has diffused tenderness with guarding and rebound pain, no organomegaly, BS present. Ext: No pitting leg edema bilaterally. 2+DP/PT pulse bilaterally. Musculoskeletal: No joint deformities, No joint redness or warmth, no limitation of ROM in spin. Skin: No rashes.  Neuro: Alert, oriented X3, cranial nerves II-XII grossly intact, muscle strength 5/5 in all extremities, sensation to light touch intact.  Psych: Patient is not psychotic, no suicidal or hemocidal ideation.  Labs on Admission:  Basic Metabolic Panel:  Recent Labs Lab 10/23/15 1549  NA 137  K 3.8  CL 106  CO2 22  GLUCOSE 110*  BUN 7  CREATININE 0.84  CALCIUM 9.0   Liver Function Tests:  Recent Labs Lab 10/23/15 1549  AST 19  ALT 20  ALKPHOS 59  BILITOT 1.4*  PROT 6.2*  ALBUMIN 3.8    Recent Labs Lab 10/23/15 1549  LIPASE 21   No results for input(s): AMMONIA in the last 168 hours. CBC:  Recent Labs Lab 10/23/15 1549  WBC 12.2*  HGB 11.3*  HCT 33.7*  MCV 88.9  PLT 335   Cardiac Enzymes: No results for input(s): CKTOTAL, CKMB, CKMBINDEX, TROPONINI in the last 168 hours.  BNP (last 3 results) No results for input(s): BNP in the last 8760 hours.  ProBNP (last 3 results) No results for input(s): PROBNP in the last 8760 hours.  CBG: No results for input(s): GLUCAP in the last 168 hours.  Radiological Exams on Admission: Ct Abdomen Pelvis W Contrast  10/23/2015  CLINICAL DATA:  Right lower quadrant abdominal pain, nausea and vomiting for the past 2 days. Irregular vaginal bleeding this past week. EXAM: CT ABDOMEN AND PELVIS WITH CONTRAST TECHNIQUE: Multidetector CT imaging of the abdomen and pelvis was performed using the standard protocol following bolus administration of intravenous contrast. CONTRAST:  164mL OMNIPAQUE IOHEXOL 300 MG/ML  SOLN COMPARISON:  Previous examinations, the most recent dated 05/10/2015. FINDINGS:  Lower chest: 4 mm subpleural nodule in the left lower lobe on image 6. This unchanged since 05/01/2011, compatible with a benign nodule. Hepatobiliary: Again demonstrated is a gallbladder Phrygian cap. This contains lower density fluid than the remainder of the gallbladder. Normal appearing liver. Pancreas: No mass, inflammatory changes, or other significant abnormality. Spleen: Within normal limits in size and appearance. Adrenals/Urinary Tract: No masses identified. No evidence of hydronephrosis. Stomach/Bowel: Multiple dilated loops of jejunum in the mid abdomen. One of these demonstrates diffuse wall thickening on image number 45 of series 2. Normal caliber distal small bowel and colon. No gastric dilatation. No obstructing mass or hernia seen. Vascular/Lymphatic: No pathologically enlarged lymph nodes. No evidence of abdominal aortic aneurysm. Reproductive: Retroflexed uterus with the previously noted anterior uterine mass. The mass currently measures 3.1 x 2.6 cm on sagittal image number 67. Other: Small amount of free peritoneal fluid in the pelvic cul-de-sac, within normal limits of physiological fluid. Musculoskeletal:  Normal appearing bones. IMPRESSION: 1. Mild ileus or partial obstruction involving loops of jejunum in the mid abdomen. One  of these demonstrates mild diffuse wall thickening, possibly due to infectious or inflammatory enteritis. 2. Probable sludge in the gallbladder, sparing the fluid in the Phrygian cap. 3. 3.1 x 2.6 cm uterine fibroid. Electronically Signed   By: Claudie Revering M.D.   On: 10/23/2015 19:45    EKG: Not done in ED, will get one.   Assessment/Plan Principal Problem:   Abdominal pain Active Problems:   Depressive disorder   Nausea & vomiting   Sepsis (HCC)   Fibroid, uterine   Ileus (HCC)   Normocytic anemia   Abdominal pain, nausea no vomiting and sepsis: CT-abdomen/pelvis that showed mild ileus or partial obstruction involving loops of jejunum in the mid  abdomen, this is likely due to enteritis. Pt has significant guarding and rebound pain on physical examination. Patient is septic on admission with leukocytosis and no fever. She is hemodynamic stable. General surgeon was consulted, Dr. Harlow Asa will see patient.  -will admit to tele bed -IV Cipro and Flagyl were started in ED. -blood culture -will get Procalcitonin and trend lactic acid levels per sepsis protocol. -IVF: 3L of NS bolus in ED, followed by 75 cc/h -INR/PTT/type & screen -follow up Surgeon's recommendations  Depression: Stable, no suicidal or homicidal ideations. Not on meds at home - Observe closely  Fibroid, uterine: Her irregular vaginal bleeding is most likely due to fibroid as evidenced by CT scan. -Follow up with OB/GYN  Normocytic anemia: Hgb 11.3. Likely due to irregular vaginal bleeding secondary to fibroid. -Check anemia panel -May need iron supplement  DVT ppx: SCD  Code Status: Full code Family Communication: None at bed side.   Disposition Plan: Admit to inpatient   Date of Service 10/23/2015    Ivor Costa [facility] Pager [phone]  If 7PM-7AM, please contact night-coverage [email or web address] Password [facility] 10/23/2015, 9:38 PM

## 2015-10-23 NOTE — ED Notes (Signed)
Below order not completed by EW. 

## 2015-10-23 NOTE — ED Notes (Addendum)
Pt states she has had RLQ abdominal pain and N/V x 2 days. States she is unable to keep food down. Also states that she has had irregular vaginal bleeding this week after she already had her period last week. Alert and oriented.

## 2015-10-23 NOTE — ED Provider Notes (Signed)
CSN: [id number]     Arrival date & time 10/23/15  1520 History   First MD Initiated Contact with Patient 10/23/15 1730     Chief Complaint  Patient presents with  . Abdominal Pain     (Consider location/radiation/quality/duration/timing/severity/associated sxs/prior Treatment) HPI Comments: 24 y.o. Female with no significant past medical history presents for abdominal pain, nausea, and vomiting for the last 2-3 days.  She says that the pain is severe and on the right side of the abdomen.  She has not been able to eat or drink today because of the severe pain.  She denies pain like this before.  She also reports that she had a normal menstrual cycle that ended about 1.5 weeks ago but she has developed vaginal bleeding since the onset of abdominal pain.  Reports feeling feverish and having chills.     Past Medical History  Diagnosis Date  . Otitis externa due to Pseudomonas aeruginosa 04/2014   History reviewed. No pertinent past surgical history. Family History  Problem Relation Age of Onset  . Crohn's disease Other    Social History  Substance Use Topics  . Smoking status: Former Smoker    Types: Cigarettes    Quit date: 03/03/2014  . Smokeless tobacco: Never Used  . Alcohol Use: Yes     Comment: social   OB History    No data available     Review of Systems  Constitutional: Positive for fever, chills, appetite change and fatigue.  HENT: Negative for congestion, postnasal drip and rhinorrhea.   Eyes: Negative for pain and redness.  Respiratory: Negative for cough, chest tightness and shortness of breath.   Cardiovascular: Negative for chest pain, palpitations and leg swelling.  Gastrointestinal: Positive for nausea, vomiting, abdominal pain and diarrhea. Negative for constipation.  Genitourinary: Positive for vaginal bleeding. Negative for dysuria, hematuria, vaginal discharge and vaginal pain.  Musculoskeletal: Negative for myalgias and back pain.  Skin: Negative for rash.   Neurological: Negative for dizziness, weakness and headaches.  Hematological: Does not bruise/bleed easily.      Allergies  Peanut-containing drug products and Food  Home Medications   Prior to Admission medications   Medication Sig Start Date End Date Taking? Authorizing Provider  ibuprofen (ADVIL,MOTRIN) 800 MG tablet Take 1 tablet (800 mg total) by mouth every 8 (eight) hours as needed for mild pain or moderate pain. Patient not taking: Reported on 10/23/2015 05/24/15   Clayton Bibles, PA-C  polyethylene glycol [facility]) packet Take 17 g by mouth daily. Patient not taking: Reported on 05/24/2015 05/10/15   Charlann Lange, PA-C   BP 123/66 mmHg  Pulse 93  Temp(Src) 101.2 F (38.4 C) (Oral)  Resp 16  SpO2 98%  LMP 10/16/2015 (Approximate) Physical Exam  Constitutional: She is oriented to person, place, and time. She appears well-developed and well-nourished. She appears distressed (appears uncomfortable).  HENT:  Head: Normocephalic and atraumatic.  Right Ear: External ear normal.  Left Ear: External ear normal.  Nose: Nose normal.  Mouth/Throat: Oropharynx is clear and moist. No oropharyngeal exudate.  Eyes: EOM are normal. Pupils are equal, round, and reactive to light.  Neck: Normal range of motion. Neck supple.  Cardiovascular: Normal rate, regular rhythm, normal heart sounds and intact distal pulses.   No murmur heard. Pulmonary/Chest: Effort normal. No respiratory distress. She has no wheezes. She has no rales.  Abdominal: Soft. She exhibits no distension. There is tenderness (moderate) in the right lower quadrant and periumbilical area. There is rebound.  Musculoskeletal:  Normal range of motion. She exhibits no edema or tenderness.  Neurological: She is alert and oriented to person, place, and time.  Skin: Skin is warm and dry. No rash noted. She is not diaphoretic.  Vitals reviewed.   ED Course  Procedures (including critical care time) Labs Review Labs Reviewed   COMPREHENSIVE METABOLIC PANEL - Abnormal; Notable for the following:    Glucose, Bld 110 (*)    Total Protein 6.2 (*)    Total Bilirubin 1.4 (*)    All other components within normal limits  CBC - Abnormal; Notable for the following:    WBC 12.2 (*)    RBC 3.79 (*)    Hemoglobin 11.3 (*)    HCT 33.7 (*)    All other components within normal limits  LIPASE, BLOOD  URINALYSIS, ROUTINE W REFLEX MICROSCOPIC (NOT AT [facility])  I-STAT BETA HCG BLOOD, ED ([facility], [facility], [facility] ONLY)    Imaging Review Ct Abdomen Pelvis W Contrast  10/23/2015  CLINICAL DATA:  Right lower quadrant abdominal pain, nausea and vomiting for the past 2 days. Irregular vaginal bleeding this past week. EXAM: CT ABDOMEN AND PELVIS WITH CONTRAST TECHNIQUE: Multidetector CT imaging of the abdomen and pelvis was performed using the standard protocol following bolus administration of intravenous contrast. CONTRAST:  13mL OMNIPAQUE IOHEXOL 300 MG/ML  SOLN COMPARISON:  Previous examinations, the most recent dated 05/10/2015. FINDINGS: Lower chest: 4 mm subpleural nodule in the left lower lobe on image 6. This unchanged since 05/01/2011, compatible with a benign nodule. Hepatobiliary: Again demonstrated is a gallbladder Phrygian cap. This contains lower density fluid than the remainder of the gallbladder. Normal appearing liver. Pancreas: No mass, inflammatory changes, or other significant abnormality. Spleen: Within normal limits in size and appearance. Adrenals/Urinary Tract: No masses identified. No evidence of hydronephrosis. Stomach/Bowel: Multiple dilated loops of jejunum in the mid abdomen. One of these demonstrates diffuse wall thickening on image number 45 of series 2. Normal caliber distal small bowel and colon. No gastric dilatation. No obstructing mass or hernia seen. Vascular/Lymphatic: No pathologically enlarged lymph nodes. No evidence of abdominal aortic aneurysm. Reproductive: Retroflexed uterus with the previously noted anterior  uterine mass. The mass currently measures 3.1 x 2.6 cm on sagittal image number 67. Other: Small amount of free peritoneal fluid in the pelvic cul-de-sac, within normal limits of physiological fluid. Musculoskeletal:  Normal appearing bones. IMPRESSION: 1. Mild ileus or partial obstruction involving loops of jejunum in the mid abdomen. One of these demonstrates mild diffuse wall thickening, possibly due to infectious or inflammatory enteritis. 2. Probable sludge in the gallbladder, sparing the fluid in the Phrygian cap. 3. 3.1 x 2.6 cm uterine fibroid. Electronically Signed   By: Claudie Revering M.D.   On: 10/23/2015 19:45   I have personally reviewed and evaluated these images and lab results as part of my medical decision-making.   EKG Interpretation None      MDM  Patient was seen and evaluated in stable condition.  Patient tender on abdominal examination.  Became febrile in the ER.  No active vomiting.  CT with ileus vs partial obstruction but no active vomiting.  CT also showed diffuse wall thickening likely secondary to enteritis.  In light of leukocytosis, tenderness, CT findings patient started on Cipro/Flagyl and continued on IV fluids.  Discussed with Dr. Blaine Hamper who agreed with admission and patient was admitted under his care.  Vaginal bleeding likely related to uterine fibroid which was discussed with patient and this will need outpatient  follow up. Final diagnoses:  None    1. Enteritis  2. ileus    Harvel Quale, MD 10/23/15 2134

## 2015-10-24 LAB — RETICULOCYTES
RBC.: 3.19 MIL/uL — AB (ref 3.87–5.11)
RETIC CT PCT: 1.1 % (ref 0.4–3.1)
Retic Count, Absolute: 35.1 10*3/uL (ref 19.0–186.0)

## 2015-10-24 LAB — VITAMIN B12: VITAMIN B 12: 435 pg/mL (ref 180–914)

## 2015-10-24 LAB — ABO/RH: ABO/RH(D): A POS

## 2015-10-24 LAB — LACTIC ACID, PLASMA: LACTIC ACID, VENOUS: 0.8 mmol/L (ref 0.5–2.0)

## 2015-10-24 LAB — COMPREHENSIVE METABOLIC PANEL
ALK PHOS: 46 U/L (ref 38–126)
ALT: 16 U/L (ref 14–54)
AST: 14 U/L — AB (ref 15–41)
Albumin: 3.3 g/dL — ABNORMAL LOW (ref 3.5–5.0)
Anion gap: 6 (ref 5–15)
BUN: 7 mg/dL (ref 6–20)
CALCIUM: 7.8 mg/dL — AB (ref 8.9–10.3)
CO2: 22 mmol/L (ref 22–32)
CREATININE: 0.8 mg/dL (ref 0.44–1.00)
Chloride: 104 mmol/L (ref 101–111)
Glucose, Bld: 103 mg/dL — ABNORMAL HIGH (ref 65–99)
Potassium: 3.3 mmol/L — ABNORMAL LOW (ref 3.5–5.1)
SODIUM: 132 mmol/L — AB (ref 135–145)
Total Bilirubin: 1.6 mg/dL — ABNORMAL HIGH (ref 0.3–1.2)
Total Protein: 5.4 g/dL — ABNORMAL LOW (ref 6.5–8.1)

## 2015-10-24 LAB — CBC
HCT: 28.9 % — ABNORMAL LOW (ref 36.0–46.0)
Hemoglobin: 9.6 g/dL — ABNORMAL LOW (ref 12.0–15.0)
MCH: 29.7 pg (ref 26.0–34.0)
MCHC: 33.2 g/dL (ref 30.0–36.0)
MCV: 89.5 fL (ref 78.0–100.0)
PLATELETS: 302 10*3/uL (ref 150–400)
RBC: 3.23 MIL/uL — AB (ref 3.87–5.11)
RDW: 15 % (ref 11.5–15.5)
WBC: 15.2 10*3/uL — ABNORMAL HIGH (ref 4.0–10.5)

## 2015-10-24 LAB — IRON AND TIBC
Iron: <5 ug/dL — ABNORMAL LOW (ref 28–170)
TIBC: 286 ug/dL (ref 250–450)

## 2015-10-24 LAB — FOLATE: FOLATE: 5.6 ng/mL — AB (ref >5.9)

## 2015-10-24 LAB — FERRITIN: Ferritin: 35 ng/mL (ref 11–307)

## 2015-10-24 LAB — GLUCOSE, CAPILLARY: Glucose-Capillary: 90 mg/dL (ref 65–99)

## 2015-10-24 LAB — HIV ANTIBODY (ROUTINE TESTING W REFLEX): HIV SCREEN 4TH GENERATION: NONREACTIVE

## 2015-10-24 MED ORDER — PIPERACILLIN-TAZOBACTAM 3.375 G IVPB
3.3750 g | Freq: Three times a day (TID) | INTRAVENOUS | Status: DC
  Administered 2015-10-24 – 2015-10-29 (×16): 3.375 g via INTRAVENOUS
  Filled 2015-10-24 (×17): qty 50

## 2015-10-24 MED ORDER — SODIUM CHLORIDE 0.9 % IV BOLUS (SEPSIS)
500.0000 mL | Freq: Once | INTRAVENOUS | Status: AC
  Administered 2015-10-24: 500 mL via INTRAVENOUS

## 2015-10-24 NOTE — Progress Notes (Signed)
Initial Nutrition Assessment  DOCUMENTATION CODES:   Non-severe (moderate) malnutrition in context of acute illness/injury, Obesity unspecified  INTERVENTION:  - Diet advancement as medically feasible - RD will continue to monitor for needs  NUTRITION DIAGNOSIS:   Inadequate oral intake related to inability to eat as evidenced by NPO status.  GOAL:   Patient will meet greater than or equal to 90% of their needs  MONITOR:   Diet advancement, Weight trends, Labs, I & O's  REASON FOR ASSESSMENT:   Malnutrition Screening Tool  ASSESSMENT:   24 y.o. female with PMH of depression, appendicitis X Turner, who presents with nausea, vomiting, abdominal pain, vaginal bleeding.  Pt seen for MST. BMI indicates obesity. Pt has been NPO since admission and unable to meet needs. Pt states that yesterday (12/4) she ate chicken nuggets, [place] fries, and pineapple. She did not drink any liquids yesterday. She states that for the past 5-7 days she has had decreased appetite. This was not associated with nausea or vomiting. She states that nausea and vomiting began yesterday (12/4) with 2 episodes of emesis and that she had 1 episode of emesis today.   She states that over the past 1 month she has lost ~30 lbs unintentionally. This would indicate 12% wt loss in 1 month time frame which is significant. Noted that chart review indicates weight of either 195 lbs or 215 lbs for the past 2 years:  11/05/13: 195 lbs 05/03/14: 215 lbs 05/09/15: 195 lbs 10/23/15: 215 lbs  Will continue to monitor weight trends. No muscle or fat wasting noted at this time. Medications reviewed. Labs reviewed; Na: 132 mmol/L, K: 3.3 mmol/L, Ca: 7.8 mg/dL.   Diet Order:  Diet NPO time specified Except for: Sips with Meds  Skin:  Reviewed, no issues  Last BM:  12/4  Height:   Ht Readings from Last 1 Encounters:  10/23/15 5\' 11"  (1.803 m)    Weight:   Wt Readings from Last 1 Encounters:  10/23/15 215 lb 2.7 oz  (97.6 kg)    Ideal Body Weight:  70.45 kg (kg)  BMI:  Body mass index is 30.02 kg/(m^2).  Estimated Nutritional Needs:   Kcal:  1750-2000  Protein:  70-80 grams  Fluid:  2.2 L/day  EDUCATION NEEDS:   No education needs identified at this time     Jarome Matin, RD, LDN Inpatient Clinical Dietitian Pager # [phone] After hours/weekend pager # [phone]

## 2015-10-24 NOTE — Progress Notes (Signed)
[facility]                                                                              Patient Demographics  Maria Sampson, is a 24 y.o. female, DOB - 17-Jul-1991, [id number]  Admit date - 10/23/2015   Admitting Physician Ivor Costa, MD  Outpatient Primary MD for the patient is Pcp Not In System  LOS - 1   Chief Complaint  Patient presents with  . Abdominal Pain      HPI on 10/23/2015 by Dr. Ivor Costa Maria Sampson is a 24 y.o. female with PMH of depression, appendicitis X Turner, who presents with nausea, vomiting, abdominal pain, vaginal bleeding.  Patient reports that she has been having nausea, vomiting, abdominal pain in the past 2 days. She also has fever and chills. She vomited 3 times without blood in the vomitus. She does not have diarrhea. Her abdominal pain is located in the right lower quadrant, constant, 10 out of 10 in severity, sharp, nonradiating. Patient does not have chest pain, cough, shortness of breath, symptoms of a UTI or unilateral weakness. Patient reports that she has irregular vaginal bleeding today. She had menstrual period last week.  In ED, patient was found to have WBC 12.2, lipase 21, negative urinalysis, temperature 101.2, heart rate 90-100, electrolytes and renal function okay. CT-abdomen/pelvis that showed mild ileus or partial obstruction involving loops of jejunum in the mid abdomen. One of these demonstrates mild diffuse wall thickening, possibly due to infectious or inflammatory enteritis, probable sludge in the gallbladder, sparing the fluid in the phrygian cap and 3. 3.1 x 2.6 cm uterine fibroid.  Assessment & Plan   Sepsis secondary to enteritis/colitis -Upon admission, patient did have leukocytosis with fever and tachycardia -CT abdomen: Mild ileus or partial obstruction involving loops of jejunum in the mid abdomen, and mild diffuse wall thickening, infectious or inflammatory enteritis.  -Continue IV antibiotics, Sampson and  Flagyl -Continue IV fluids, pain control, antiemetics as needed -Lactic acid level 0.8 -Blood cultures pending  Abdominal pain, nausea and vomiting -Treatment plan as above -Gen. surgery consultation appreciated, continue IV antibiotics at this time -Gen. surgery reviewed CT scan with radiology, index is normal appearing  Depression -Currently on no home medications  Uterine fibroid with irregular menses -Patient to follow-up with OB/GYN upon discharge -Fibroid noted on CT scan: 3.1 x 2.6 cm uterine fibroid  Normocytic anemia likely secondary to irregular menses -Hemoglobin 9.6 -Continue to monitor CBC -Anemia panel pending  Polysubstance abuse -Drug screen positive for [facility] -Patient denied using any illicit drugs or smoking or alcohol use  Code Status: Full  Family Communication: None at bedside  Disposition Plan: Admitted. Continue IV antibiotics and IV fluids.  Time Spent in minutes   30 minutes  Procedures  None  Consults   Gen. surgery  DVT Prophylaxis  SCDs  Lab Results  Component Value Date   PLT 302 10/24/2015    Medications  Scheduled Meds: . famotidine (PEPCID) IV  20 mg Intravenous Q12H  . piperacillin-tazobactam (ZOSYN)  IV  3.375 g Intravenous Q8H  . sodium chloride  3 mL Intravenous Q12H   Continuous Infusions: . sodium chloride  PRN Meds:.ketorolac, morphine injection, ondansetron (ZOFRAN) IV  Antibiotics    Anti-infectives    Start     Dose/Rate Route Frequency Ordered Stop   10/24/15 1200  piperacillin-tazobactam (ZOSYN) IVPB 3.375 g     3.375 g 12.5 mL/hr over 240 Minutes Intravenous Every 8 hours 10/24/15 1043     10/23/15 2145  metroNIDAZOLE (FLAGYL) IVPB 500 mg  Status:  Discontinued     500 mg 100 mL/hr over 60 Minutes Intravenous Every 8 hours 10/23/15 2133 10/24/15 1012   10/23/15 2045  ciprofloxacin (Sampson) IVPB 400 mg  Status:  Discontinued     400 mg 200 mL/hr over 60 Minutes Intravenous Every 12 hours 10/23/15 2043  10/24/15 1012   10/23/15 2045  metroNIDAZOLE (FLAGYL) IVPB 500 mg     500 mg 100 mL/hr over 60 Minutes Intravenous  Once 10/23/15 2043 10/23/15 2209      Subjective:   Maria Sampson seen and examined today.  Patient continues to complain of abdominal pain. Filter, pain is worse when she takes morphine. Denies any current nausea or vomiting. Denies any chest pain or shortness of breath, dizziness or headache.  Objective:   Filed Vitals:   10/23/15 2245 10/24/15 0104 10/24/15 0208 10/24/15 0502  BP: 130/65   98/55  Pulse: 100   73  Temp: 100.1 F (37.8 C) 99.6 F (37.6 C) 99.3 F (37.4 C) 98.5 F (36.9 C)  TempSrc: Oral Oral Oral Oral  Resp: 18   16  Height: 5\' 11"  (1.803 m)     Weight: 97.6 kg (215 lb 2.7 oz)     SpO2: 99%   99%    Wt Readings from Last 3 Encounters:  10/23/15 97.6 kg (215 lb 2.7 oz)  05/09/15 88.451 kg (195 lb)  05/03/14 97.523 kg (215 lb)     Intake/Output Summary (Last 24 hours) at 10/24/15 1105 Last data filed at 10/24/15 [id number]  Gross per 24 hour  Intake    250 ml  Output    400 ml  Net   -150 ml    Exam  General: Well developed, well nourished, NAD, appears stated age  49: NCAT,mucous membranes moist.   Cardiovascular: S1 S2 auscultated, no rubs, murmurs or gallops. Regular rate and rhythm.  Respiratory: Clear to auscultation bilaterally with equal chest rise  Abdomen: Soft, diffuse TTP, nondistended, + bowel sounds  Extremities: warm dry without cyanosis clubbing or edema  Neuro: AAOx3, nonfocal  Skin: Without rashes exudates or nodules, multiple tattoos  Psych: Normal affect and demeanor with intact judgement and insight  Data Review   Micro Results No results found for this or any previous visit (from the past 240 hour(s)).  Radiology Reports Ct Abdomen Pelvis W Contrast  10/24/2015  ADDENDUM REPORT: 10/24/2015 10:15 ADDENDUM: I reviewed this case with Dr. Jackolyn Confer on 10/24/2015 with respect to the appendix. The  appendix is visualized, best seen on the coronal images and appears normal. In addition to the mild small bowel distension described, there is possible diffuse colonic wall thickening, although the colon is relatively decompressed. This could reflect a mild colitis in this patient with diarrhea. Electronically Signed   By: Richardean Sale M.D.   On: 10/24/2015 10:15  10/24/2015  CLINICAL DATA:  Right lower quadrant abdominal pain, nausea and vomiting for the past 2 days. Irregular vaginal bleeding this past week. EXAM: CT ABDOMEN AND PELVIS WITH CONTRAST TECHNIQUE: Multidetector CT imaging of the abdomen and pelvis was performed using the standard protocol following  bolus administration of intravenous contrast. CONTRAST:  116mL OMNIPAQUE IOHEXOL 300 MG/ML  SOLN COMPARISON:  Previous examinations, the most recent dated 05/10/2015. FINDINGS: Lower chest: 4 mm subpleural nodule in the left lower lobe on image 6. This unchanged since 05/01/2011, compatible with a benign nodule. Hepatobiliary: Again demonstrated is a gallbladder Phrygian cap. This contains lower density fluid than the remainder of the gallbladder. Normal appearing liver. Pancreas: No mass, inflammatory changes, or other significant abnormality. Spleen: Within normal limits in size and appearance. Adrenals/Urinary Tract: No masses identified. No evidence of hydronephrosis. Stomach/Bowel: Multiple dilated loops of jejunum in the mid abdomen. One of these demonstrates diffuse wall thickening on image number 45 of series 2. Normal caliber distal small bowel and colon. No gastric dilatation. No obstructing mass or hernia seen. Vascular/Lymphatic: No pathologically enlarged lymph nodes. No evidence of abdominal aortic aneurysm. Reproductive: Retroflexed uterus with the previously noted anterior uterine mass. The mass currently measures 3.1 x 2.6 cm on sagittal image number 67. Other: Small amount of free peritoneal fluid in the pelvic cul-de-sac, within  normal limits of physiological fluid. Musculoskeletal:  Normal appearing bones. IMPRESSION: 1. Mild ileus or partial obstruction involving loops of jejunum in the mid abdomen. One of these demonstrates mild diffuse wall thickening, possibly due to infectious or inflammatory enteritis. 2. Probable sludge in the gallbladder, sparing the fluid in the Phrygian cap. 3. 3.1 x 2.6 cm uterine fibroid. Electronically Signed: By: Claudie Revering M.D. On: 10/23/2015 19:45    CBC  Recent Labs Lab 10/23/15 1549 10/24/15 0048  WBC 12.2* 15.2*  HGB 11.3* 9.6*  HCT 33.7* 28.9*  PLT 335 302  MCV 88.9 89.5  MCH 29.8 29.7  MCHC 33.5 33.2  RDW 15.0 15.0    Chemistries   Recent Labs Lab 10/23/15 1549 10/24/15 0048  NA 137 132*  K 3.8 3.3*  CL 106 104  CO2 22 22  GLUCOSE 110* 103*  BUN 7 7  CREATININE 0.84 0.80  CALCIUM 9.0 7.8*  AST 19 14*  ALT 20 16  ALKPHOS 59 46  BILITOT 1.4* 1.6*   ------------------------------------------------------------------------------------------------------------------ estimated creatinine clearance is 137.1 mL/min (by C-G formula based on Cr of 0.8). ------------------------------------------------------------------------------------------------------------------ No results for input(s): HGBA1C in the last 72 hours. ------------------------------------------------------------------------------------------------------------------ No results for input(s): CHOL, HDL, LDLCALC, TRIG, CHOLHDL, LDLDIRECT in the last 72 hours. ------------------------------------------------------------------------------------------------------------------ No results for input(s): TSH, T4TOTAL, T3FREE, THYROIDAB in the last 72 hours.  Invalid input(s): FREET3 ------------------------------------------------------------------------------------------------------------------  Recent Labs  10/24/15 0048  VITAMINB12 435  FOLATE 5.6*  FERRITIN 35  TIBC 286  IRON <5*  RETICCTPCT 1.1     Coagulation profile  Recent Labs Lab 10/23/15 2202  INR 1.65*    No results for input(s): DDIMER in the last 72 hours.  Cardiac Enzymes No results for input(s): CKMB, TROPONINI, MYOGLOBIN in the last 168 hours.  Invalid input(s): CK ------------------------------------------------------------------------------------------------------------------ Invalid input(s): POCBNP    Tayana Shankle D.O. on 10/24/2015 at 11:05 AM  Between 7am to 7pm - Pager - [phone]  After 7pm go to [email or web address] - password [email or web address]  And look for the night coverage person covering for me after hours  [facility] Office  [phone]

## 2015-10-24 NOTE — Progress Notes (Signed)
MD notified regarding patient not urinating since 0700 this morning. Patient was bladder scanned twice showing 0 ml. Patient was encouraged to void and attempted, but states she has no urge to void. Bladder not distended. MD placed order to increased NaCl 0.9% from 75 ml/hr to 150 ml/hr- MD also gave verbal to monitor for 3-4 more hours. Will continue to monitor.

## 2015-10-24 NOTE — Progress Notes (Signed)
Patient voided 150 ml after fluids were in creased to 150 ml/hr. Post void residual was 0 ml after patient was bladder scanned. PCP on call was notified. Will continue to monitor the patient.

## 2015-10-24 NOTE — Progress Notes (Signed)
ANTIBIOTIC CONSULT NOTE - INITIAL  Pharmacy Consult for Zosyn Indication: Enteritis  Allergies  Allergen Reactions  . Peanut-Containing Drug Products Anaphylaxis and Hives  . Food Hives, Itching and Swelling    PEANUTS    Patient Measurements: Height: 5\' 11"  (180.3 cm) Weight: 215 lb 2.7 oz (97.6 kg) IBW/kg (Calculated) : 70.8  Vital Signs: Temp: 98.5 F (36.9 C) (12/05 0502) Temp Source: Oral (12/05 0502) BP: 98/55 mmHg (12/05 0502) Pulse Rate: 73 (12/05 0502) Intake/Output from previous day: 12/04 0701 - 12/05 0700 In: 250 [IV Piggyback:250] Out: 400 [Urine:400] Intake/Output from this shift:    Labs:  Recent Labs  10/23/15 1549 10/24/15 0048  WBC 12.2* 15.2*  HGB 11.3* 9.6*  PLT 335 302  CREATININE 0.84 0.80   Estimated Creatinine Clearance: 137.1 mL/min (by C-G formula based on Cr of 0.8). No results for input(s): VANCOTROUGH, VANCOPEAK, VANCORANDOM, GENTTROUGH, GENTPEAK, GENTRANDOM, TOBRATROUGH, TOBRAPEAK, TOBRARND, AMIKACINPEAK, AMIKACINTROU, AMIKACIN in the last 72 hours.   Microbiology: No results found for this or any previous visit (from the past 720 hour(s)).  Medical History: Past Medical History  Diagnosis Date  . Otitis externa due to Pseudomonas aeruginosa 04/2014  . Depression     Medications:  Scheduled:  . famotidine (PEPCID) IV  20 mg Intravenous Q12H  . piperacillin-tazobactam (ZOSYN)  IV  3.375 g Intravenous Q8H  . sodium chloride  3 mL Intravenous Q12H   Infusions:  . sodium chloride     PRN: ketorolac, morphine injection, ondansetron (ZOFRAN) IV  Assessment: 24 yo female admitted 12/4 with increasing abdominal pain. She was started on cipro and flagyl. CT shows small and large bowel enteritis. Today, 12/5, Pharmacy is consulted to dose Zosyn.  Goal of Therapy:  Dosage appropriate for indication and renal function  Plan:   Zosyn 3.375gm IV q8h (4hr extended infusions)  Current dosage is appropriate and need for further  dosage adjustment appears unlikely at present.  Will sign off at this time.  Please reconsult if a change in clinical status warrants re-evaluation of dosage.  Peggyann Juba, PharmD, BCPS Pager: [phone] 10/24/2015,10:49 AM

## 2015-10-24 NOTE — Progress Notes (Signed)
Subjective: Pain is unchanged.  Has not been and antibiotics recently or around anyone with diarrhea by her report.  Objective: Vital signs in last 24 hours: Temp:  [98.5 F (36.9 C)-101.2 F (38.4 C)] 98.5 F (36.9 C) (12/05 0502) Pulse Rate:  [73-105] 73 (12/05 0502) Resp:  [16-20] 16 (12/05 0502) BP: (98-130)/(55-86) 98/55 mmHg (12/05 0502) SpO2:  [98 %-100 %] 99 % (12/05 0502) Weight:  [97.6 kg (215 lb 2.7 oz)] 97.6 kg (215 lb 2.7 oz) (12/04 2245) Last BM Date: 10/23/15  Intake/Output from previous day: 12/04 0701 - 12/05 0700 In: 250 [IV Piggyback:250] Out: 400 [Urine:400] Intake/Output this shift:    PE: General- In NAD Abdomen-soft with mild-moderated diffuse tenderness  Lab Results:   Recent Labs  10/23/15 1549 10/24/15 0048  WBC 12.2* 15.2*  HGB 11.3* 9.6*  HCT 33.7* 28.9*  PLT 335 302   BMET  Recent Labs  10/23/15 1549 10/24/15 0048  NA 137 132*  K 3.8 3.3*  CL 106 104  CO2 22 22  GLUCOSE 110* 103*  BUN 7 7  CREATININE 0.84 0.80  CALCIUM 9.0 7.8*   PT/INR  Recent Labs  10/23/15 2202  LABPROT 19.5*  INR 1.65*   Comprehensive Metabolic Panel:    Component Value Date/Time   NA 132* 10/24/2015 0048   NA 137 10/23/2015 1549   K 3.3* 10/24/2015 0048   K 3.8 10/23/2015 1549   CL 104 10/24/2015 0048   CL 106 10/23/2015 1549   CO2 22 10/24/2015 0048   CO2 22 10/23/2015 1549   BUN 7 10/24/2015 0048   BUN 7 10/23/2015 1549   CREATININE 0.80 10/24/2015 0048   CREATININE 0.84 10/23/2015 1549   GLUCOSE 103* 10/24/2015 0048   GLUCOSE 110* 10/23/2015 1549   CALCIUM 7.8* 10/24/2015 0048   CALCIUM 9.0 10/23/2015 1549   AST 14* 10/24/2015 0048   AST 19 10/23/2015 1549   ALT 16 10/24/2015 0048   ALT 20 10/23/2015 1549   ALKPHOS 46 10/24/2015 0048   ALKPHOS 59 10/23/2015 1549   BILITOT 1.6* 10/24/2015 0048   BILITOT 1.4* 10/23/2015 1549   PROT 5.4* 10/24/2015 0048   PROT 6.2* 10/23/2015 1549   ALBUMIN 3.3* 10/24/2015 0048   ALBUMIN  3.8 10/23/2015 1549     Studies/Results: Ct Abdomen Pelvis W Contrast  10/24/2015  ADDENDUM REPORT: 10/24/2015 10:15 ADDENDUM: I reviewed this case with Dr. Jackolyn Confer on 10/24/2015 with respect to the appendix. The appendix is visualized, best seen on the coronal images and appears normal. In addition to the mild small bowel distension described, there is possible diffuse colonic wall thickening, although the colon is relatively decompressed. This could reflect a mild colitis in this patient with diarrhea. Electronically Signed   By: Richardean Sale M.D.   On: 10/24/2015 10:15  10/24/2015  CLINICAL DATA:  Right lower quadrant abdominal pain, nausea and vomiting for the past 2 days. Irregular vaginal bleeding this past week. EXAM: CT ABDOMEN AND PELVIS WITH CONTRAST TECHNIQUE: Multidetector CT imaging of the abdomen and pelvis was performed using the standard protocol following bolus administration of intravenous contrast. CONTRAST:  179mL OMNIPAQUE IOHEXOL 300 MG/ML  SOLN COMPARISON:  Previous examinations, the most recent dated 05/10/2015. FINDINGS: Lower chest: 4 mm subpleural nodule in the left lower lobe on image 6. This unchanged since 05/01/2011, compatible with a benign nodule. Hepatobiliary: Again demonstrated is a gallbladder Phrygian cap. This contains lower density fluid than the remainder of the gallbladder. Normal appearing liver. Pancreas: No  mass, inflammatory changes, or other significant abnormality. Spleen: Within normal limits in size and appearance. Adrenals/Urinary Tract: No masses identified. No evidence of hydronephrosis. Stomach/Bowel: Multiple dilated loops of jejunum in the mid abdomen. One of these demonstrates diffuse wall thickening on image number 45 of series 2. Normal caliber distal small bowel and colon. No gastric dilatation. No obstructing mass or hernia seen. Vascular/Lymphatic: No pathologically enlarged lymph nodes. No evidence of abdominal aortic aneurysm.  Reproductive: Retroflexed uterus with the previously noted anterior uterine mass. The mass currently measures 3.1 x 2.6 cm on sagittal image number 67. Other: Small amount of free peritoneal fluid in the pelvic cul-de-sac, within normal limits of physiological fluid. Musculoskeletal:  Normal appearing bones. IMPRESSION: 1. Mild ileus or partial obstruction involving loops of jejunum in the mid abdomen. One of these demonstrates mild diffuse wall thickening, possibly due to infectious or inflammatory enteritis. 2. Probable sludge in the gallbladder, sparing the fluid in the Phrygian cap. 3. 3.1 x 2.6 cm uterine fibroid. Electronically Signed: By: Claudie Revering M.D. On: 10/23/2015 19:45    Anti-infectives: Anti-infectives    Start     Dose/Rate Route Frequency Ordered Stop   10/24/15 1200  piperacillin-tazobactam (ZOSYN) IVPB 3.375 g     3.375 g 12.5 mL/hr over 240 Minutes Intravenous Every 8 hours 10/24/15 1043     10/23/15 2145  metroNIDAZOLE (FLAGYL) IVPB 500 mg  Status:  Discontinued     500 mg 100 mL/hr over 60 Minutes Intravenous Every 8 hours 10/23/15 2133 10/24/15 1012   10/23/15 2045  ciprofloxacin (CIPRO) IVPB 400 mg  Status:  Discontinued     400 mg 200 mL/hr over 60 Minutes Intravenous Every 12 hours 10/23/15 2043 10/24/15 1012   10/23/15 2045  metroNIDAZOLE (FLAGYL) IVPB 500 mg     500 mg 100 mL/hr over 60 Minutes Intravenous  Once 10/23/15 2043 10/23/15 2209      Assessment Principal Problem:   Abdominal pain with acute enteritis and colitis by CT-on Cipro and Flagyl; WBC up today; appendix is normal on CT     LOS: 1 day   Plan: Continue bowel rest.  Broaden antibiotic spectrum (change to Zosyn).  Repeat WBC tomorrow.  Aylanie Cubillos J 10/24/2015

## 2015-10-25 LAB — CBC
HCT: 25.5 % — ABNORMAL LOW (ref 36.0–46.0)
Hemoglobin: 8.5 g/dL — ABNORMAL LOW (ref 12.0–15.0)
MCH: 29.7 pg (ref 26.0–34.0)
MCHC: 33.3 g/dL (ref 30.0–36.0)
MCV: 89.2 fL (ref 78.0–100.0)
Platelets: 240 10*3/uL (ref 150–400)
RBC: 2.86 MIL/uL — ABNORMAL LOW (ref 3.87–5.11)
RDW: 14.9 % (ref 11.5–15.5)
WBC: 8.2 10*3/uL (ref 4.0–10.5)

## 2015-10-25 LAB — BASIC METABOLIC PANEL WITH GFR
Anion gap: 9 (ref 5–15)
BUN: 11 mg/dL (ref 6–20)
CO2: 21 mmol/L — ABNORMAL LOW (ref 22–32)
Calcium: 8 mg/dL — ABNORMAL LOW (ref 8.9–10.3)
Chloride: 106 mmol/L (ref 101–111)
Creatinine, Ser: 1 mg/dL (ref 0.44–1.00)
GFR calc Af Amer: >60 mL/min
GFR calc non Af Amer: >60 mL/min
Glucose, Bld: 82 mg/dL (ref 65–99)
Potassium: 3.5 mmol/L (ref 3.5–5.1)
Sodium: 136 mmol/L (ref 135–145)

## 2015-10-25 LAB — GLUCOSE, CAPILLARY: Glucose-Capillary: 82 mg/dL (ref 65–99)

## 2015-10-25 LAB — PROTIME-INR
INR: 1.55 — ABNORMAL HIGH (ref 0.00–1.49)
Prothrombin Time: 18.6 seconds — ABNORMAL HIGH (ref 11.6–15.2)

## 2015-10-25 MED ORDER — SODIUM CHLORIDE 0.9 % IV SOLN
510.0000 mg | INTRAVENOUS | Status: DC
  Administered 2015-10-25: 510 mg via INTRAVENOUS
  Filled 2015-10-25: qty 17

## 2015-10-25 MED ORDER — SACCHAROMYCES BOULARDII 250 MG PO CAPS
250.0000 mg | ORAL_CAPSULE | Freq: Two times a day (BID) | ORAL | Status: DC
  Administered 2015-10-25 – 2015-10-29 (×9): 250 mg via ORAL
  Filled 2015-10-25 (×10): qty 1

## 2015-10-25 NOTE — Progress Notes (Signed)
Subjective: She continues to have abdominal pain with movement and palpation.  At first it seems to be all over her abdomen, but further exam and she relates worst discomfort is in the RLQ, although you get discomfort with any palpation, more right side than left.  + BS, she had 2 episodes of loose stools before admit, and one since, with no further BM's since the one after admission.    Objective: Vital signs in last 24 hours: Temp:  [98.4 F (36.9 C)-100 F (37.8 C)] 99.1 F (37.3 C) (12/06 0612) Pulse Rate:  [76-98] 98 (12/06 0612) Resp:  [18-20] 20 (12/06 0612) BP: (115-146)/(62-68) 146/63 mmHg (12/06 0612) SpO2:  [100 %] 100 % (12/06 0612) Last BM Date: 10/23/15 Urine 150 ordered TM 100 yesterday 1900 hours, VSS WBC down to 8.2 this AM H/H is down Intake/Output from previous day: 12/05 0701 - 12/06 0700 In: 3311.3 [I.V.:2661.3; IV Piggyback:650] Out: 150 [Urine:150] Intake/Output this shift: Total I/O In: -  Out: 150 [Urine:150]  General appearance: alert, cooperative and no distress GI: soft, she is tender all over on initial exam.  Worst pain is located right side, RLQ more than RUQ.  + BS.  no BM last 2 days.  Lab Results:   Recent Labs  10/24/15 0048 10/25/15 0546  WBC 15.2* 8.2  HGB 9.6* 8.5*  HCT 28.9* 25.5*  PLT 302 240    BMET  Recent Labs  10/24/15 0048 10/25/15 0546  NA 132* 136  K 3.3* 3.5  CL 104 106  CO2 22 21*  GLUCOSE 103* 82  BUN 7 11  CREATININE 0.80 1.00  CALCIUM 7.8* 8.0*   PT/INR  Recent Labs  10/23/15 2202  LABPROT 19.5*  INR 1.65*     Recent Labs Lab 10/23/15 1549 10/24/15 0048  AST 19 14*  ALT 20 16  ALKPHOS 59 46  BILITOT 1.4* 1.6*  PROT 6.2* 5.4*  ALBUMIN 3.8 3.3*     Lipase     Component Value Date/Time   LIPASE 21 10/23/2015 1549     Studies/Results: Ct Abdomen Pelvis W Contrast  10/24/2015  ADDENDUM REPORT: 10/24/2015 10:15 ADDENDUM: I reviewed this case with Dr. Jackolyn Confer on 10/24/2015  with respect to the appendix. The appendix is visualized, best seen on the coronal images and appears normal. In addition to the mild small bowel distension described, there is possible diffuse colonic wall thickening, although the colon is relatively decompressed. This could reflect a mild colitis in this patient with diarrhea. Electronically Signed   By: Richardean Sale M.D.   On: 10/24/2015 10:15  10/24/2015  CLINICAL DATA:  Right lower quadrant abdominal pain, nausea and vomiting for the past 2 days. Irregular vaginal bleeding this past week. EXAM: CT ABDOMEN AND PELVIS WITH CONTRAST TECHNIQUE: Multidetector CT imaging of the abdomen and pelvis was performed using the standard protocol following bolus administration of intravenous contrast. CONTRAST:  157mL OMNIPAQUE IOHEXOL 300 MG/ML  SOLN COMPARISON:  Previous examinations, the most recent dated 05/10/2015. FINDINGS: Lower chest: 4 mm subpleural nodule in the left lower lobe on image 6. This unchanged since 05/01/2011, compatible with a benign nodule. Hepatobiliary: Again demonstrated is a gallbladder Phrygian cap. This contains lower density fluid than the remainder of the gallbladder. Normal appearing liver. Pancreas: No mass, inflammatory changes, or other significant abnormality. Spleen: Within normal limits in size and appearance. Adrenals/Urinary Tract: No masses identified. No evidence of hydronephrosis. Stomach/Bowel: Multiple dilated loops of jejunum in the mid abdomen. One of these  demonstrates diffuse wall thickening on image number 45 of series 2. Normal caliber distal small bowel and colon. No gastric dilatation. No obstructing mass or hernia seen. Vascular/Lymphatic: No pathologically enlarged lymph nodes. No evidence of abdominal aortic aneurysm. Reproductive: Retroflexed uterus with the previously noted anterior uterine mass. The mass currently measures 3.1 x 2.6 cm on sagittal image number 67. Other: Small amount of free peritoneal fluid in  the pelvic cul-de-sac, within normal limits of physiological fluid. Musculoskeletal:  Normal appearing bones. IMPRESSION: 1. Mild ileus or partial obstruction involving loops of jejunum in the mid abdomen. One of these demonstrates mild diffuse wall thickening, possibly due to infectious or inflammatory enteritis. 2. Probable sludge in the gallbladder, sparing the fluid in the Phrygian cap. 3. 3.1 x 2.6 cm uterine fibroid. Electronically Signed: By: Claudie Revering M.D. On: 10/23/2015 19:45    Medications: . famotidine (PEPCID) IV  20 mg Intravenous Q12H  . piperacillin-tazobactam (ZOSYN)  IV  3.375 g Intravenous Q8H  . sodium chloride  3 mL Intravenous Q12H    Assessment/Plan Nausea, vomiting, diarrhea, fever, abdominal pain, and vaginal bleeding CT with edema and thickening of the small bowel, consistent with enteritis ileus Appendix is normal on CT Anemia  Family hx of Crohn's disease Antibiotics: Cipro/Flagyl converted to Zosyn 10/24/15, day 2 today DVT:  SCD's ordered   Plan:  Continue antibiotics, I would start her on some clears and see how she does.  Add probiotic.      LOS: 2 days    Maria Sampson 10/25/2015

## 2015-10-25 NOTE — Progress Notes (Signed)
Report received from J. Scotton, RN. No change from initial pm assessment. Will continue to monitor and follow the POC. 

## 2015-10-25 NOTE — Progress Notes (Signed)
Bladder scan performed. Less than 12 cc found in bladder. Patient denies urge to void,complains of pain at 9/10. Abdomen tender to touch.

## 2015-10-25 NOTE — Progress Notes (Signed)
[facility]                                                                              Patient Demographics  Maria Sampson, is a 24 y.o. female, DOB - [birth year], [id number]  Admit date - 10/23/2015   Admitting Physician Maria Costa, MD  Outpatient Primary MD for the patient is Pcp Not In System  LOS - 2   Chief Complaint  Patient presents with  . Abdominal Pain      HPI on 10/23/2015 by Dr. Ivor Sampson Maria Sampson is a 24 y.o. female with PMH of depression, appendicitis X Turner, who presents with nausea, vomiting, abdominal pain, vaginal bleeding.  Patient reports that she has been having nausea, vomiting, abdominal pain in the past 2 days. She also has fever and chills. She vomited 3 times without blood in the vomitus. She does not have diarrhea. Her abdominal pain is located in the right lower quadrant, constant, 10 out of 10 in severity, sharp, nonradiating. Patient does not have chest pain, cough, shortness of breath, symptoms of a UTI or unilateral weakness. Patient reports that she has irregular vaginal bleeding today. She had menstrual period last week.  In ED, patient was found to have WBC 12.2, lipase 21, negative urinalysis, temperature 101.2, heart rate 90-100, electrolytes and renal function okay. CT-abdomen/pelvis that showed mild ileus or partial obstruction involving loops of jejunum in the mid abdomen. One of these demonstrates mild diffuse wall thickening, possibly due to infectious or inflammatory enteritis, probable sludge in the gallbladder, sparing the fluid in the phrygian cap and 3. 3.1 x 2.6 cm uterine fibroid.  Interim history Patient started on IV Sampson/flagyl for enteritis/colitis. Gen surg following.  Assessment & Plan   Sepsis secondary to enteritis/colitis -Upon admission, patient did have leukocytosis with fever and tachycardia, improving -CT abdomen: Mild ileus or partial obstruction involving loops of jejunum in the mid abdomen, and mild  diffuse wall thickening, infectious or inflammatory enteritis.  -Continue IV antibiotics, Sampson and Flagyl -Continue IV fluids, pain control, antiemetics as needed -Lactic acid level 0.8 -Blood cultures pending  Abdominal pain, nausea and vomiting -Treatment plan as above -Gen. surgery consultation appreciated, continue IV antibiotics at this time -Gen. surgery reviewed CT scan with radiology, index is normal appearing -patient started on clears today  Decreased urine output -Despite being on 200cc/hr, urine output remains low.  -Bladder scan done twice shows 0 -?Not sure if patient is going to bathroom on her own.  Kidney function normal.  -Will continue to monitor closely -Spoke with Dr. Justin Sampson via phone, who recommended to keep monitoring  Depression -Currently on no home medications  Uterine fibroid with irregular menses -Patient to follow-up with OB/GYN upon discharge -Fibroid noted on CT scan: 3.1 x 2.6 cm uterine fibroid  Normocytic anemia likely secondary to irregular menses -Hemoglobin 8.5 -Continue to monitor CBC -Anemia panel: Iron <5, Ferritin 35 -Will give dose of feraheme today  Polysubstance abuse -Drug screen positive for [facility] -Patient denied using any illicit drugs or smoking or alcohol use- states she has not used marijuana in over a month and that her roommate smokes.  Code Status: Full  Family Communication: None  at bedside  Disposition Plan: Admitted. Continue IV antibiotics and IV fluids. Will start on clears today per surgery recommendation.  Time Spent in minutes   30 minutes  Procedures  None  Consults   Gen. Surgery Dr. Justin Sampson, nephro, via phone  DVT Prophylaxis  SCDs  Lab Results  Component Value Date   PLT 240 10/25/2015    Medications  Scheduled Meds: . famotidine (PEPCID) IV  20 mg Intravenous Q12H  . piperacillin-tazobactam (ZOSYN)  IV  3.375 g Intravenous Q8H  . saccharomyces boulardii  250 mg Oral BID  . sodium chloride  3 mL  Intravenous Q12H   Continuous Infusions: . sodium chloride 200 mL/hr at 10/25/15 0910   PRN Meds:.ketorolac, morphine injection, ondansetron (ZOFRAN) IV  Antibiotics    Anti-infectives    Start     Dose/Rate Route Frequency Ordered Stop   10/24/15 1200  piperacillin-tazobactam (ZOSYN) IVPB 3.375 g     3.375 g 12.5 mL/hr over 240 Minutes Intravenous Every 8 hours 10/24/15 1043     10/23/15 2145  metroNIDAZOLE (FLAGYL) IVPB 500 mg  Status:  Discontinued     500 mg 100 mL/hr over 60 Minutes Intravenous Every 8 hours 10/23/15 2133 10/24/15 1012   10/23/15 2045  ciprofloxacin (Sampson) IVPB 400 mg  Status:  Discontinued     400 mg 200 mL/hr over 60 Minutes Intravenous Every 12 hours 10/23/15 2043 10/24/15 1012   10/23/15 2045  metroNIDAZOLE (FLAGYL) IVPB 500 mg     500 mg 100 mL/hr over 60 Minutes Intravenous  Once 10/23/15 2043 10/23/15 2209      Subjective:   Maria Sampson seen and examined today.  Patient continues to complain of abdominal pain, nausea.  Has gas like pain.  Denies bowel movement. Denies any chest pain or shortness of breath, dizziness or headache.  Objective:   Filed Vitals:   10/24/15 1444 10/24/15 2148 10/24/15 2330 10/25/15 0612  BP: 115/62 134/68  146/63  Pulse: 76 86  98  Temp: 98.7 F (37.1 C) 100 F (37.8 C) 98.4 F (36.9 C) 99.1 F (37.3 C)  TempSrc: Oral Oral Oral Oral  Resp: 18 20  20   Height:      Weight:      SpO2: 100% 100%  100%    Wt Readings from Last 3 Encounters:  10/23/15 97.6 kg (215 lb 2.7 oz)  05/09/15 88.451 kg (195 lb)  05/03/14 97.523 kg (215 lb)     Intake/Output Summary (Last 24 hours) at 10/25/15 1023 Last data filed at 10/25/15 [id number]  Gross per 24 hour  Intake 3311.25 ml  Output    300 ml  Net 3011.25 ml    Exam  General: Well developed, well nourished, NAD  HEENT: NCAT,mucous membranes moist.   Cardiovascular: S1 S2 auscultated, RRR, no murmurs  Respiratory: Clear to auscultation bilaterally with equal  chest rise  Abdomen: Soft, diffuse TTP, more RLQ, nondistended, + bowel sounds  Extremities: warm dry without cyanosis clubbing or edema  Neuro: AAOx3, nonfocal  Skin: Without rashes exudates or nodules, multiple tattoos  Psych: Normal affect and demeanor  Data Review   Micro Results No results found for this or any previous visit (from the past 240 hour(s)).  Radiology Reports Ct Abdomen Pelvis W Contrast  10/24/2015  ADDENDUM REPORT: 10/24/2015 10:15 ADDENDUM: I reviewed this case with Dr. Jackolyn Confer on 10/24/2015 with respect to the appendix. The appendix is visualized, best seen on the coronal images and appears normal. In addition to the  mild small bowel distension described, there is possible diffuse colonic wall thickening, although the colon is relatively decompressed. This could reflect a mild colitis in this patient with diarrhea. Electronically Signed   By: Richardean Sale M.D.   On: 10/24/2015 10:15  10/24/2015  CLINICAL DATA:  Right lower quadrant abdominal pain, nausea and vomiting for the past 2 days. Irregular vaginal bleeding this past week. EXAM: CT ABDOMEN AND PELVIS WITH CONTRAST TECHNIQUE: Multidetector CT imaging of the abdomen and pelvis was performed using the standard protocol following bolus administration of intravenous contrast. CONTRAST:  122mL OMNIPAQUE IOHEXOL 300 MG/ML  SOLN COMPARISON:  Previous examinations, the most recent dated 05/10/2015. FINDINGS: Lower chest: 4 mm subpleural nodule in the left lower lobe on image 6. This unchanged since 05/01/2011, compatible with a benign nodule. Hepatobiliary: Again demonstrated is a gallbladder Phrygian cap. This contains lower density fluid than the remainder of the gallbladder. Normal appearing liver. Pancreas: No mass, inflammatory changes, or other significant abnormality. Spleen: Within normal limits in size and appearance. Adrenals/Urinary Tract: No masses identified. No evidence of hydronephrosis.  Stomach/Bowel: Multiple dilated loops of jejunum in the mid abdomen. One of these demonstrates diffuse wall thickening on image number 45 of series 2. Normal caliber distal small bowel and colon. No gastric dilatation. No obstructing mass or hernia seen. Vascular/Lymphatic: No pathologically enlarged lymph nodes. No evidence of abdominal aortic aneurysm. Reproductive: Retroflexed uterus with the previously noted anterior uterine mass. The mass currently measures 3.1 x 2.6 cm on sagittal image number 67. Other: Small amount of free peritoneal fluid in the pelvic cul-de-sac, within normal limits of physiological fluid. Musculoskeletal:  Normal appearing bones. IMPRESSION: 1. Mild ileus or partial obstruction involving loops of jejunum in the mid abdomen. One of these demonstrates mild diffuse wall thickening, possibly due to infectious or inflammatory enteritis. 2. Probable sludge in the gallbladder, sparing the fluid in the Phrygian cap. 3. 3.1 x 2.6 cm uterine fibroid. Electronically Signed: By: Claudie Revering M.D. On: 10/23/2015 19:45    CBC  Recent Labs Lab 10/23/15 1549 10/24/15 0048 10/25/15 0546  WBC 12.2* 15.2* 8.2  HGB 11.3* 9.6* 8.5*  HCT 33.7* 28.9* 25.5*  PLT 335 302 240  MCV 88.9 89.5 89.2  MCH 29.8 29.7 29.7  MCHC 33.5 33.2 33.3  RDW 15.0 15.0 14.9    Chemistries   Recent Labs Lab 10/23/15 1549 10/24/15 0048 10/25/15 0546  NA 137 132* 136  K 3.8 3.3* 3.5  CL 106 104 106  CO2 22 22 21*  GLUCOSE 110* 103* 82  BUN 7 7 11   CREATININE 0.84 0.80 1.00  CALCIUM 9.0 7.8* 8.0*  AST 19 14*  --   ALT 20 16  --   ALKPHOS 59 46  --   BILITOT 1.4* 1.6*  --    ------------------------------------------------------------------------------------------------------------------ estimated creatinine clearance is 109.7 mL/min (by C-G formula based on Cr of 1). ------------------------------------------------------------------------------------------------------------------ No results  for input(s): HGBA1C in the last 72 hours. ------------------------------------------------------------------------------------------------------------------ No results for input(s): CHOL, HDL, LDLCALC, TRIG, CHOLHDL, LDLDIRECT in the last 72 hours. ------------------------------------------------------------------------------------------------------------------ No results for input(s): TSH, T4TOTAL, T3FREE, THYROIDAB in the last 72 hours.  Invalid input(s): FREET3 ------------------------------------------------------------------------------------------------------------------  Recent Labs  10/24/15 0048  VITAMINB12 435  FOLATE 5.6*  FERRITIN 35  TIBC 286  IRON <5*  RETICCTPCT 1.1    Coagulation profile  Recent Labs Lab 10/23/15 2202  INR 1.65*    No results for input(s): DDIMER in the last 72 hours.  Cardiac Enzymes No  results for input(s): CKMB, TROPONINI, MYOGLOBIN in the last 168 hours.  Invalid input(s): CK ------------------------------------------------------------------------------------------------------------------ Invalid input(s): POCBNP    Azilee Pirro D.O. on 10/25/2015 at 10:23 AM  Between 7am to 7pm - Pager - [phone]  After 7pm go to [email or web address] - password [email or web address]  And look for the night coverage person covering for me after hours  [facility] Office  [phone]

## 2015-10-26 DIAGNOSIS — K567 Ileus, unspecified: Secondary | ICD-10-CM

## 2015-10-26 DIAGNOSIS — E440 Moderate protein-calorie malnutrition: Secondary | ICD-10-CM | POA: Insufficient documentation

## 2015-10-26 DIAGNOSIS — D259 Leiomyoma of uterus, unspecified: Secondary | ICD-10-CM

## 2015-10-26 DIAGNOSIS — D649 Anemia, unspecified: Secondary | ICD-10-CM

## 2015-10-26 LAB — BASIC METABOLIC PANEL
Anion gap: 4 — ABNORMAL LOW (ref 5–15)
BUN: 5 mg/dL — AB (ref 6–20)
CHLORIDE: 109 mmol/L (ref 101–111)
CO2: 26 mmol/L (ref 22–32)
CREATININE: 0.88 mg/dL (ref 0.44–1.00)
Calcium: 8.3 mg/dL — ABNORMAL LOW (ref 8.9–10.3)
GFR calc Af Amer: >60 mL/min (ref >60)
GFR calc non Af Amer: >60 mL/min (ref >60)
GLUCOSE: 91 mg/dL (ref 65–99)
Potassium: 3.1 mmol/L — ABNORMAL LOW (ref 3.5–5.1)
Sodium: 139 mmol/L (ref 135–145)

## 2015-10-26 LAB — CBC
HEMATOCRIT: 25 % — AB (ref 36.0–46.0)
Hemoglobin: 8.2 g/dL — ABNORMAL LOW (ref 12.0–15.0)
MCH: 29.1 pg (ref 26.0–34.0)
MCHC: 32.8 g/dL (ref 30.0–36.0)
MCV: 88.7 fL (ref 78.0–100.0)
Platelets: 247 10*3/uL (ref 150–400)
RBC: 2.82 MIL/uL — ABNORMAL LOW (ref 3.87–5.11)
RDW: 14.7 % (ref 11.5–15.5)
WBC: 5 10*3/uL (ref 4.0–10.5)

## 2015-10-26 MED ORDER — POTASSIUM CHLORIDE 10 MEQ/100ML IV SOLN
10.0000 meq | INTRAVENOUS | Status: AC
  Administered 2015-10-26 (×4): 10 meq via INTRAVENOUS
  Filled 2015-10-26 (×4): qty 100

## 2015-10-26 NOTE — Progress Notes (Signed)
  Subjective: Feels a little better today.  Had N/V/D yesterday after drinking chicken broth.  Tolerating ginger ale well.  Objective: Vital signs in last 24 hours: Temp:  [98.2 F (36.8 C)-98.9 F (37.2 C)] 98.9 F (37.2 C) (12/07 0348) Pulse Rate:  [78-86] 81 (12/07 0348) Resp:  [20] 20 (12/07 0348) BP: (122-129)/(64-79) 126/79 mmHg (12/07 0348) SpO2:  [100 %] 100 % (12/07 0348) Last BM Date: 10/25/15 (reported one episode of loose stool)  Intake/Output from previous day: 12/06 0701 - 12/07 0700 In: 6012.8 [P.O.:720; I.V.:4925.8; IV Piggyback:367] Out: [id number] [Urine:1675] Intake/Output this shift: Total I/O In: 240 [P.O.:240] Out: -   PE: General- In NAD Abdomen-soft, mild RLQ tenderness  Lab Results:   Recent Labs  10/25/15 0546 10/26/15 0538  WBC 8.2 5.0  HGB 8.5* 8.2*  HCT 25.5* 25.0*  PLT 240 247   BMET  Recent Labs  10/25/15 0546 10/26/15 0538  NA 136 139  K 3.5 3.1*  CL 106 109  CO2 21* 26  GLUCOSE 82 91  BUN 11 5*  CREATININE 1.00 0.88  CALCIUM 8.0* 8.3*   PT/INR  Recent Labs  10/23/15 2202 10/25/15 0927  LABPROT 19.5* 18.6*  INR 1.65* 1.55*   Comprehensive Metabolic Panel:    Component Value Date/Time   NA 139 10/26/2015 0538   NA 136 10/25/2015 0546   K 3.1* 10/26/2015 0538   K 3.5 10/25/2015 0546   CL 109 10/26/2015 0538   CL 106 10/25/2015 0546   CO2 26 10/26/2015 0538   CO2 21* 10/25/2015 0546   BUN 5* 10/26/2015 0538   BUN 11 10/25/2015 0546   CREATININE 0.88 10/26/2015 0538   CREATININE 1.00 10/25/2015 0546   GLUCOSE 91 10/26/2015 0538   GLUCOSE 82 10/25/2015 0546   CALCIUM 8.3* 10/26/2015 0538   CALCIUM 8.0* 10/25/2015 0546   AST 14* 10/24/2015 0048   AST 19 10/23/2015 1549   ALT 16 10/24/2015 0048   ALT 20 10/23/2015 1549   ALKPHOS 46 10/24/2015 0048   ALKPHOS 59 10/23/2015 1549   BILITOT 1.6* 10/24/2015 0048   BILITOT 1.4* 10/23/2015 1549   PROT 5.4* 10/24/2015 0048   PROT 6.2* 10/23/2015 1549   ALBUMIN  3.3* 10/24/2015 0048   ALBUMIN 3.8 10/23/2015 1549     Studies/Results: No results found.  Anti-infectives: Anti-infectives    Start     Dose/Rate Route Frequency Ordered Stop   10/24/15 1200  piperacillin-tazobactam (ZOSYN) IVPB 3.375 g     3.375 g 12.5 mL/hr over 240 Minutes Intravenous Every 8 hours 10/24/15 1043     10/23/15 2145  metroNIDAZOLE (FLAGYL) IVPB 500 mg  Status:  Discontinued     500 mg 100 mL/hr over 60 Minutes Intravenous Every 8 hours 10/23/15 2133 10/24/15 1012   10/23/15 2045  ciprofloxacin (CIPRO) IVPB 400 mg  Status:  Discontinued     400 mg 200 mL/hr over 60 Minutes Intravenous Every 12 hours 10/23/15 2043 10/24/15 1012   10/23/15 2045  metroNIDAZOLE (FLAGYL) IVPB 500 mg     500 mg 100 mL/hr over 60 Minutes Intravenous  Once 10/23/15 2043 10/23/15 2209      Assessment Principal Problem:   Acute enterocolitis-slowly improving.    LOS: 3 days   Plan: Continue IV abxs.  Keep clear liquid diet today.   Azadeh Hyder J 10/26/2015

## 2015-10-26 NOTE — Progress Notes (Signed)
[facility] PROGRESS NOTE  ANNITA STANCHFIELD [id number] DOB: 01/10/1991 DOA: 10/23/2015 PCP: Pcp Not In System  HPI/Brief narrative HPI on 10/23/2015 by Dr. Ivor Costa Maria Sampson is a 24 y.o. female with PMH of depression, appendicitis X Turner, who presents with nausea, vomiting, abdominal pain, vaginal bleeding.  Patient reports that she has been having nausea, vomiting, abdominal pain in the past 2 days. She also has fever and chills. She vomited 3 times without blood in the vomitus. She does not have diarrhea. Her abdominal pain is located in the right lower quadrant, constant, 10 out of 10 in severity, sharp, nonradiating. Patient does not have chest pain, cough, shortness of breath, symptoms of a UTI or unilateral weakness. Patient reports that she has irregular vaginal bleeding today. She had menstrual period last week.  In ED, patient was found to have WBC 12.2, lipase 21, negative urinalysis, temperature 101.2, heart rate 90-100, electrolytes and renal function okay. CT-abdomen/pelvis that showed mild ileus or partial obstruction involving loops of jejunum in the mid abdomen. One of these demonstrates mild diffuse wall thickening, possibly due to infectious or inflammatory enteritis, probable sludge in the gallbladder, sparing the fluid in the phrygian cap and 3. 3.1 x 2.6 cm uterine fibroid.  Assessment/Plan: Sepsis secondary to enteritis/colitis -Upon admission, patient did have leukocytosis with fever and tachycardia, resolved -CT abdomen: Mild ileus or partial obstruction involving loops of jejunum in the mid abdomen, and mild diffuse wall thickening, infectious or inflammatory enteritis.  -for now, continue IV Cipro and Flagyl -Continue IV fluids, pain control, antiemetics as needed -Blood cultures negative thus far  Abdominal pain, nausea and vomiting -Gen. surgery consultation appreciated, continue IV antibiotics at this time -Gen. surgery reviewed CT scan with  radiology, index is normal appearing -patient to continue on clears per Surgery recs  Decreased urine output -Despite being on 200cc/hr, urine output remains low.  -Bladder scan done twice shows 0 -?Not sure if patient is going to bathroom on her own. Kidney function normal.  -Will continue to monitor closely -Dr. Ree Kida had spoken with Dr. Justin Mend via phone, who recommended to keep monitoring  Depression -Currently on no home medications  Uterine fibroid with irregular menses -Patient to follow-up with OB/GYN upon discharge -Fibroid noted on CT scan: 3.1 x 2.6 cm uterine fibroid  Normocytic anemia likely secondary to irregular menses -Hemoglobin stable -Continue to monitor CBC -Anemia panel: Iron <5, Ferritin 35  Polysubstance abuse -Drug screen positive for [facility] -Patient denied using any illicit drugs or smoking or alcohol use- states she has not used marijuana in over a month and that her roommate smokes.  Code Status: Full Family Communication: Pt in room Disposition Plan: D/c home when tolerates at least soft diet   Consultants:  General Surgery  Procedures:    Antibiotics: Anti-infectives    Start     Dose/Rate Route Frequency Ordered Stop   10/24/15 1200  piperacillin-tazobactam (ZOSYN) IVPB 3.375 g     3.375 g 12.5 mL/hr over 240 Minutes Intravenous Every 8 hours 10/24/15 1043     10/23/15 2145  metroNIDAZOLE (FLAGYL) IVPB 500 mg  Status:  Discontinued     500 mg 100 mL/hr over 60 Minutes Intravenous Every 8 hours 10/23/15 2133 10/24/15 1012   10/23/15 2045  ciprofloxacin (CIPRO) IVPB 400 mg  Status:  Discontinued     400 mg 200 mL/hr over 60 Minutes Intravenous Every 12 hours 10/23/15 2043 10/24/15 1012   10/23/15 2045  metroNIDAZOLE (FLAGYL) IVPB 500  mg     500 mg 100 mL/hr over 60 Minutes Intravenous  Once 10/23/15 2043 10/23/15 2209      HPI/Subjective: Eager to go home  Objective: Filed Vitals:   10/25/15 1345 10/25/15 2054 10/26/15 0348  10/26/15 1318  BP: 122/64 129/77 126/79 130/87  Pulse: 78 86 81 82  Temp: 98.2 F (36.8 C) 98.9 F (37.2 C) 98.9 F (37.2 C) 98.6 F (37 C)  TempSrc: Oral Oral Oral Oral  Resp: 20 20 20 18   Height:      Weight:      SpO2: 100% 100% 100% 99%    Intake/Output Summary (Last 24 hours) at 10/26/15 1729 Last data filed at 10/26/15 1610  Gross per 24 hour  Intake 5573.33 ml  Output   1300 ml  Net 4273.33 ml   Filed Weights   10/23/15 2245  Weight: 97.6 kg (215 lb 2.7 oz)    Exam:   General:  Awake, in nad  Cardiovascular: regular, s1, s2  Respiratory: normal resp effort, no wheezing  Abdomen: soft, obese, pos BS  Musculoskeletal: perfused, no clubbing   Data Reviewed: Basic Metabolic Panel:  Recent Labs Lab 10/23/15 1549 10/24/15 0048 10/25/15 0546 10/26/15 0538  NA 137 132* 136 139  K 3.8 3.3* 3.5 3.1*  CL 106 104 106 109  CO2 22 22 21* 26  GLUCOSE 110* 103* 82 91  BUN 7 7 11  5*  CREATININE 0.84 0.80 1.00 0.88  CALCIUM 9.0 7.8* 8.0* 8.3*   Liver Function Tests:  Recent Labs Lab 10/23/15 1549 10/24/15 0048  AST 19 14*  ALT 20 16  ALKPHOS 59 46  BILITOT 1.4* 1.6*  PROT 6.2* 5.4*  ALBUMIN 3.8 3.3*    Recent Labs Lab 10/23/15 1549  LIPASE 21   No results for input(s): AMMONIA in the last 168 hours. CBC:  Recent Labs Lab 10/23/15 1549 10/24/15 0048 10/25/15 0546 10/26/15 0538  WBC 12.2* 15.2* 8.2 5.0  HGB 11.3* 9.6* 8.5* 8.2*  HCT 33.7* 28.9* 25.5* 25.0*  MCV 88.9 89.5 89.2 88.7  PLT 335 302 240 247   Cardiac Enzymes: No results for input(s): CKTOTAL, CKMB, CKMBINDEX, TROPONINI in the last 168 hours. BNP (last 3 results) No results for input(s): BNP in the last 8760 hours.  ProBNP (last 3 results) No results for input(s): PROBNP in the last 8760 hours.  CBG:  Recent Labs Lab 10/24/15 0803 10/25/15 0751  GLUCAP 90 82    Recent Results (from the past 240 hour(s))  Culture, blood (routine x 2)     Status: None  (Preliminary result)   Collection Time: 10/23/15 12:48 AM  Result Value Ref Range Status   Specimen Description BLOOD RIGHT ARM  Final   Special Requests BOTTLES DRAWN AEROBIC ONLY 10CC  Final   Culture   Final    NO GROWTH 2 DAYS Performed at [facility]    Report Status PENDING  Incomplete  Culture, blood (routine x 2)     Status: None (Preliminary result)   Collection Time: 10/23/15 10:00 PM  Result Value Ref Range Status   Specimen Description BLOOD RIGHT ANTECUBITAL  Final   Special Requests BOTTLES DRAWN AEROBIC AND ANAEROBIC 5ML  Final   Culture   Final    NO GROWTH 2 DAYS Performed at [facility]    Report Status PENDING  Incomplete     Studies: No results found.  Scheduled Meds: . famotidine (PEPCID) IV  20 mg Intravenous Q12H  .  ferumoxytol  510 mg Intravenous Weekly  . piperacillin-tazobactam (ZOSYN)  IV  3.375 g Intravenous Q8H  . saccharomyces boulardii  250 mg Oral BID  . sodium chloride  3 mL Intravenous Q12H   Continuous Infusions: . sodium chloride 200 mL/hr at 10/26/15 1450    Principal Problem:   Abdominal pain Active Problems:   Depressive disorder   Nausea & vomiting   Sepsis ([place])   Fibroid, uterine   Ileus ([place])   Normocytic anemia   CHIU, STEPHEN K  [facility] Pager [phone]. If 7PM-7AM, please contact night-coverage at [email or web address], password [facility] 10/26/2015, 5:29 PM  LOS: 3 days

## 2015-10-27 DIAGNOSIS — F329 Major depressive disorder, single episode, unspecified: Secondary | ICD-10-CM

## 2015-10-27 DIAGNOSIS — H8110 Benign paroxysmal vertigo, unspecified ear: Secondary | ICD-10-CM

## 2015-10-27 DIAGNOSIS — E440 Moderate protein-calorie malnutrition: Secondary | ICD-10-CM

## 2015-10-27 LAB — COMPREHENSIVE METABOLIC PANEL
ALBUMIN: 2.5 g/dL — AB (ref 3.5–5.0)
ALK PHOS: 75 U/L (ref 38–126)
ALT: 14 U/L (ref 14–54)
ANION GAP: 3 — AB (ref 5–15)
AST: 15 U/L (ref 15–41)
BILIRUBIN TOTAL: 0.8 mg/dL (ref 0.3–1.2)
BUN: <5 mg/dL — ABNORMAL LOW (ref 6–20)
CO2: 24 mmol/L (ref 22–32)
Calcium: 8.3 mg/dL — ABNORMAL LOW (ref 8.9–10.3)
Chloride: 110 mmol/L (ref 101–111)
Creatinine, Ser: 0.81 mg/dL (ref 0.44–1.00)
GFR calc Af Amer: >60 mL/min (ref >60)
GLUCOSE: 87 mg/dL (ref 65–99)
POTASSIUM: 3.5 mmol/L (ref 3.5–5.1)
Sodium: 137 mmol/L (ref 135–145)
Total Protein: 5.3 g/dL — ABNORMAL LOW (ref 6.5–8.1)

## 2015-10-27 LAB — CBC
HEMATOCRIT: 23.5 % — AB (ref 36.0–46.0)
HEMOGLOBIN: 7.9 g/dL — AB (ref 12.0–15.0)
MCH: 29.3 pg (ref 26.0–34.0)
MCHC: 33.6 g/dL (ref 30.0–36.0)
MCV: 87 fL (ref 78.0–100.0)
Platelets: 248 10*3/uL (ref 150–400)
RBC: 2.7 MIL/uL — ABNORMAL LOW (ref 3.87–5.11)
RDW: 14.8 % (ref 11.5–15.5)
WBC: 3.4 10*3/uL — ABNORMAL LOW (ref 4.0–10.5)

## 2015-10-27 LAB — GLUCOSE, CAPILLARY: Glucose-Capillary: 86 mg/dL (ref 65–99)

## 2015-10-27 LAB — MAGNESIUM: Magnesium: 1.8 mg/dL (ref 1.7–2.4)

## 2015-10-27 MED ORDER — MECLIZINE HCL 25 MG PO TABS
25.0000 mg | ORAL_TABLET | Freq: Three times a day (TID) | ORAL | Status: DC | PRN
  Administered 2015-10-27: 25 mg via ORAL
  Filled 2015-10-27 (×2): qty 1

## 2015-10-27 MED ORDER — PROCHLORPERAZINE EDISYLATE 5 MG/ML IJ SOLN: 10.0000 mg | Freq: Four times a day (QID) | INTRAMUSCULAR | Status: DC | PRN

## 2015-10-27 MED ORDER — SODIUM CHLORIDE 0.9 % IV SOLN
INTRAVENOUS | Status: DC
  Administered 2015-10-27 – 2015-10-29 (×4): via INTRAVENOUS

## 2015-10-27 MED ORDER — FAMOTIDINE 40 MG PO TABS
40.0000 mg | ORAL_TABLET | Freq: Every day | ORAL | Status: DC
  Administered 2015-10-27 – 2015-10-29 (×3): 40 mg via ORAL
  Filled 2015-10-27 (×3): qty 1

## 2015-10-27 NOTE — Progress Notes (Signed)
  Subjective: Still complains of pain most of the time.   She had some nausea but no vomiting yesterday.  She says she had allot of loose stool on one occasion yesterday.  She did not notice any relief with the BM.   She says she is better than admit, but still feels bad.   Objective: Vital signs in last 24 hours: Temp:  [98.2 F (36.8 C)-98.6 F (37 C)] 98.2 F (36.8 C) (12/08 0442) Pulse Rate:  [76-82] 76 (12/08 0442) Resp:  [18-20] 20 (12/08 0442) BP: (120-133)/(73-88) 120/73 mmHg (12/08 0442) SpO2:  [99 %-100 %] 100 % (12/08 0442) Last BM Date: 10/25/15 PO 360 recorded  Diet: clears BM x 1  850 urine  Afebrile, VSS CMP is OK Bilirubin is back to normal   WBC is low now H/H continues to decline CT was on 10/23/15 Blood cultures are negative HIV is negative No stool cutures WBVCT was  Intake/Output from previous day: 12/07 0701 - 12/08 0700 In: 5610 [P.O.:360; I.V.:4800; IV Piggyback:450] Out: 850 [Urine:850] Intake/Output this shift: Total I/O In: 120 [P.O.:120] Out: -   General appearance: alert, cooperative, no distress and very anxious. GI: soft, tender with any touch, says she thinks she will vomit when you touch her.  BS hyperactive.  no localized pain,it sems to be all over the abdomen.    Lab Results:   Recent Labs  10/26/15 0538 10/27/15 0430  WBC 5.0 3.4*  HGB 8.2* 7.9*  HCT 25.0* 23.5*  PLT 247 248    BMET  Recent Labs  10/26/15 0538 10/27/15 0430  NA 139 137  K 3.1* 3.5  CL 109 110  CO2 26 24  GLUCOSE 91 87  BUN 5* <5*  CREATININE 0.88 0.81  CALCIUM 8.3* 8.3*   PT/INR  Recent Labs  10/25/15 0927  LABPROT 18.6*  INR 1.55*     Recent Labs Lab 10/23/15 1549 10/24/15 0048 10/27/15 0430  AST 19 14* 15  ALT 20 16 14   ALKPHOS 59 46 75  BILITOT 1.4* 1.6* 0.8  PROT 6.2* 5.4* 5.3*  ALBUMIN 3.8 3.3* 2.5*     Lipase     Component Value Date/Time   LIPASE 21 10/23/2015 1549     Studies/Results: No results  found.  Medications: . famotidine (PEPCID) IV  20 mg Intravenous Q12H  . ferumoxytol  510 mg Intravenous Weekly  . piperacillin-tazobactam (ZOSYN)  IV  3.375 g Intravenous Q8H  . saccharomyces boulardii  250 mg Oral BID  . sodium chloride  3 mL Intravenous Q12H    Assessment/Plan Nausea, vomiting, diarrhea, fever, abdominal pain, and vaginal bleeding CT with edema and thickening of the small bowel, consistent with enteritis ileus 10/23/15. Appendix is normal on CT Anemia  Family hx of Crohn's disease Polysubstance use Antibiotics: Cipro/Flagyl converted to Zosyn 10/24/15, day 4 today DVT: SCD's/ongoing anemia, no anticoagulant   Plan:  She is minimally better, Not taking much with the clear diet, I do not see any stool cultures.  She is not having allot of stool, but what she is having appears to be very loose per her report.  I will recheck 2 view and, and discuss possible repeat of CT scan.  Family hx of Crohn's disease in grandmother and her grandmothers sister.  She has never had a GI evaluation.    LOS: 4 days    Maria Sampson 10/27/2015

## 2015-10-27 NOTE — Progress Notes (Signed)
Nutrition Follow-up  DOCUMENTATION CODES:   Non-severe (moderate) malnutrition in context of acute illness/injury, Obesity unspecified  INTERVENTION:  - Continued diet advancement as medically feasible - RD will continue to monitor for needs  NUTRITION DIAGNOSIS:   Inadequate oral intake related to inability to eat as evidenced by NPO status. -diet now advanced but no intakes documented  GOAL:   Patient will meet greater than or equal to 90% of their needs -unmet  MONITOR:   Diet advancement, PO intake, Weight trends, Labs, I & O's  ASSESSMENT:   24 y.o. female with PMH of depression, appendicitis X Turner, who presents with nausea, vomiting, abdominal pain, vaginal bleeding.  12/8 Diet advancement as follows: 12/6 @ 1026: NPO to CLD 12/8 @ 1005: CLD to FLD  No intakes documented since diet advancement began 12/6. Unable to talk with pt x3 attempted visits (out of the room, walking in hallway, episode of emesis with RN assisting).  Surgery note this AM at 0908 states: She reports that she feels a little better but is afraid to eat because she is afraid she will have N/V. Will try full liquids today.  Plan to repeat CT 12/9 if pt does not improve today. Not meeting nutrition-related needs. Medications reviewed. Labs reviewed; BUN <5, Ca: 8.3 mg/dL.   12/5 - Pt has been NPO since admission and unable to meet needs.  - Pt states that yesterday (12/4) she ate chicken nuggets, [place] fries, and pineapple. - She did not drink any liquids yesterday (12/4).  - She states that for the past 5-7 days she has had decreased appetite.  - This was not associated with nausea or vomiting.  - She states that nausea and vomiting began yesterday (12/4) with 2 episodes of emesis and that she had 1 episode of emesis today.  - She states that over the past 1 month she has lost ~30 lbs unintentionally.  - This would indicate 12% wt loss in 1 month time frame which is significant.  - Noted that  chart review indicates weight of either 195 lbs or 215 lbs for the past 2 years:  11/05/13: 195 lbs 05/03/14: 215 lbs 05/09/15: 195 lbs 10/23/15: 215 lbs  No muscle or fat wasting noted at this time.   Diet Order:  Diet full liquid Room service appropriate?: Yes; Fluid consistency:: Thin  Skin:  Reviewed, no issues  Last BM:  12/3  Height:   Ht Readings from Last 1 Encounters:  10/23/15 5\' 11"  (1.803 m)    Weight:   Wt Readings from Last 1 Encounters:  10/23/15 215 lb 2.7 oz (97.6 kg)    Ideal Body Weight:  70.45 kg (kg)  BMI:  Body mass index is 30.02 kg/(m^2).  Estimated Nutritional Needs:   Kcal:  1750-2000  Protein:  70-80 grams  Fluid:  2.2 L/day  EDUCATION NEEDS:   No education needs identified at this time     Jarome Matin, RD, LDN Inpatient Clinical Dietitian Pager # [phone] After hours/weekend pager # [phone]

## 2015-10-27 NOTE — Progress Notes (Signed)
[facility] PROGRESS NOTE  Maria Sampson [id number] DOB: 1991/09/25 DOA: 10/23/2015 PCP: Pcp Not In System  HPI/Brief narrative HPI on 10/23/2015 by Dr. Ivor Costa Maria Sampson is a 24 y.o. female with PMH of depression, appendicitis X Turner, who presents with nausea, vomiting, abdominal pain, vaginal bleeding.  Patient reports that she has been having nausea, vomiting, abdominal pain in the past 2 days. She also has fever and chills. She vomited 3 times without blood in the vomitus. She does not have diarrhea. Her abdominal pain is located in the right lower quadrant, constant, 10 out of 10 in severity, sharp, nonradiating. Patient does not have chest pain, cough, shortness of breath, symptoms of a UTI or unilateral weakness. Patient reports that she has irregular vaginal bleeding today. She had menstrual period last week.  In ED, patient was found to have WBC 12.2, lipase 21, negative urinalysis, temperature 101.2, heart rate 90-100, electrolytes and renal function okay. CT-abdomen/pelvis that showed mild ileus or partial obstruction involving loops of jejunum in the mid abdomen. One of these demonstrates mild diffuse wall thickening, possibly due to infectious or inflammatory enteritis, probable sludge in the gallbladder, sparing the fluid in the phrygian cap and 3. 3.1 x 2.6 cm uterine fibroid.  Assessment/Plan: Enteritis/colitis with sepsis present on admit -Upon admission, patient did have leukocytosis with fever and tachycardia, now resolved -CT abdomen: Mild ileus or partial obstruction involving loops of jejunum in the mid abdomen, and mild diffuse wall thickening, infectious or inflammatory enteritis.  -for now, continue IV Cipro and Flagyl -Continue IV fluids, pain control, antiemetics as needed -Blood cultures negative thus far -Per Surgery, plans for repeat CT abd if continues to have nausea/vomiting  Abdominal pain, nausea and vomiting -Gen. surgery  consultation appreciated, continue IV antibiotics at this time -patient to continue on clears per Surgery recs. If no improvement, then possible CT abd  Decreased urine output -Despite being on continuous IVF, urine output remains low.  -Multiple bladder scan demonstrates minimal residual urine -?Not sure if patient is going to bathroom on her own. Kidney function normal.  -Will continue to monitor closely -Dr. Ree Kida had spoken with Dr. Justin Mend via phone, who recommended to keep monitoring -Pt clinically euvolemic  Depression -Currently on no home medications  Uterine fibroid with irregular menses -Patient to follow-up with OB/GYN upon discharge -Fibroid noted on CT scan: 3.1 x 2.6 cm uterine fibroid  Normocytic anemia likely secondary to irregular menses -Hemoglobin stable -Continue to monitor CBC -Anemia panel: Iron <5, Ferritin 35  Polysubstance abuse -Drug screen positive for [facility] -Patient denied using any illicit drugs or smoking or alcohol use- states she has not used marijuana in over a month and that her roommate smokes.  Vertigo -Pt describes "room spinning" on position changes that resolves at rest and worse with head movement -Will give trial of meclizine -Consult PT for vestibular PT  Code Status: Full Family Communication: Pt in room Disposition Plan: D/c home when tolerates at least soft diet   Consultants:  General Surgery  Procedures:    Antibiotics: Anti-infectives    Start     Dose/Rate Route Frequency Ordered Stop   10/24/15 1200  piperacillin-tazobactam (ZOSYN) IVPB 3.375 g     3.375 g 12.5 mL/hr over 240 Minutes Intravenous Every 8 hours 10/24/15 1043     10/23/15 2145  metroNIDAZOLE (FLAGYL) IVPB 500 mg  Status:  Discontinued     500 mg 100 mL/hr over 60 Minutes Intravenous Every 8 hours 10/23/15 2133  10/24/15 1012   10/23/15 2045  ciprofloxacin (CIPRO) IVPB 400 mg  Status:  Discontinued     400 mg 200 mL/hr over 60 Minutes Intravenous  Every 12 hours 10/23/15 2043 10/24/15 1012   10/23/15 2045  metroNIDAZOLE (FLAGYL) IVPB 500 mg     500 mg 100 mL/hr over 60 Minutes Intravenous  Once 10/23/15 2043 10/23/15 2209      HPI/Subjective: Complains of dizziness described as room spinning on position changes followed by nausea  Objective: Filed Vitals:   10/26/15 2106 10/27/15 0442 10/27/15 1130 10/27/15 1354  BP: 133/88 120/73 144/93 124/80  Pulse: 82 76 76 80  Temp: 98.3 F (36.8 C) 98.2 F (36.8 C)  98.3 F (36.8 C)  TempSrc: Oral Oral  Oral  Resp: 20 20 18 17   Height:      Weight:      SpO2: 100% 100% 100% 100%    Intake/Output Summary (Last 24 hours) at 10/27/15 1641 Last data filed at 10/27/15 [id number]  Gross per 24 hour  Intake   3470 ml  Output    575 ml  Net   2895 ml   Filed Weights   10/23/15 2245  Weight: 97.6 kg (215 lb 2.7 oz)    Exam:   General:  Awake, laying in bed, in nad  Cardiovascular: regular, s1, s2  Respiratory: normal resp effort, no wheezing  Abdomen: soft, obese, pos BS  Musculoskeletal: perfused, no clubbing, no cyanosis  Data Reviewed: Basic Metabolic Panel:  Recent Labs Lab 10/23/15 1549 10/24/15 0048 10/25/15 0546 10/26/15 0538 10/27/15 0430  NA 137 132* 136 139 137  K 3.8 3.3* 3.5 3.1* 3.5  CL 106 104 106 109 110  CO2 22 22 21* 26 24  GLUCOSE 110* 103* 82 91 87  BUN 7 7 11  5* <5*  CREATININE 0.84 0.80 1.00 0.88 0.81  CALCIUM 9.0 7.8* 8.0* 8.3* 8.3*  MG  --   --   --   --  1.8   Liver Function Tests:  Recent Labs Lab 10/23/15 1549 10/24/15 0048 10/27/15 0430  AST 19 14* 15  ALT 20 16 14   ALKPHOS 59 46 75  BILITOT 1.4* 1.6* 0.8  PROT 6.2* 5.4* 5.3*  ALBUMIN 3.8 3.3* 2.5*    Recent Labs Lab 10/23/15 1549  LIPASE 21   No results for input(s): AMMONIA in the last 168 hours. CBC:  Recent Labs Lab 10/23/15 1549 10/24/15 0048 10/25/15 0546 10/26/15 0538 10/27/15 0430  WBC 12.2* 15.2* 8.2 5.0 3.4*  HGB 11.3* 9.6* 8.5* 8.2* 7.9*  HCT  33.7* 28.9* 25.5* 25.0* 23.5*  MCV 88.9 89.5 89.2 88.7 87.0  PLT 335 302 240 247 248   Cardiac Enzymes: No results for input(s): CKTOTAL, CKMB, CKMBINDEX, TROPONINI in the last 168 hours. BNP (last 3 results) No results for input(s): BNP in the last 8760 hours.  ProBNP (last 3 results) No results for input(s): PROBNP in the last 8760 hours.  CBG:  Recent Labs Lab 10/24/15 0803 10/25/15 0751 10/27/15 0723  GLUCAP 90 82 86    Recent Results (from the past 240 hour(s))  Culture, blood (routine x 2)     Status: None (Preliminary result)   Collection Time: 10/23/15 12:48 AM  Result Value Ref Range Status   Specimen Description BLOOD RIGHT ARM  Final   Special Requests BOTTLES DRAWN AEROBIC ONLY 10CC  Final   Culture   Final    NO GROWTH 3 DAYS Performed at [facility]  Report Status PENDING  Incomplete  Culture, blood (routine x 2)     Status: None (Preliminary result)   Collection Time: 10/23/15 10:00 PM  Result Value Ref Range Status   Specimen Description BLOOD RIGHT ANTECUBITAL  Final   Special Requests BOTTLES DRAWN AEROBIC AND ANAEROBIC 5ML  Final   Culture   Final    NO GROWTH 3 DAYS Performed at [facility]    Report Status PENDING  Incomplete     Studies: No results found.  Scheduled Meds: . famotidine (PEPCID) IV  20 mg Intravenous Q12H  . ferumoxytol  510 mg Intravenous Weekly  . piperacillin-tazobactam (ZOSYN)  IV  3.375 g Intravenous Q8H  . saccharomyces boulardii  250 mg Oral BID  . sodium chloride  3 mL Intravenous Q12H   Continuous Infusions: . sodium chloride 200 mL (10/27/15 0920)    Principal Problem:   Abdominal pain Active Problems:   Depressive disorder   Nausea & vomiting   Sepsis ([place])   Fibroid, uterine   Ileus ([facility])   Normocytic anemia   Malnutrition of moderate degree   CHIU, [facility] Pager [phone]. If 7PM-7AM, please contact night-coverage at [email or web address], password [facility] 10/27/2015,  4:41 PM  LOS: 4 days

## 2015-10-28 ENCOUNTER — Inpatient Hospital Stay (HOSPITAL_COMMUNITY): Payer: Medicaid Other

## 2015-10-28 DIAGNOSIS — R112 Nausea with vomiting, unspecified: Secondary | ICD-10-CM

## 2015-10-28 LAB — BASIC METABOLIC PANEL
Anion gap: 5 (ref 5–15)
CHLORIDE: 106 mmol/L (ref 101–111)
CO2: 25 mmol/L (ref 22–32)
CREATININE: 0.95 mg/dL (ref 0.44–1.00)
Calcium: 8.4 mg/dL — ABNORMAL LOW (ref 8.9–10.3)
GFR calc Af Amer: >60 mL/min (ref >60)
GFR calc non Af Amer: >60 mL/min (ref >60)
GLUCOSE: 86 mg/dL (ref 65–99)
POTASSIUM: 3.6 mmol/L (ref 3.5–5.1)
Sodium: 136 mmol/L (ref 135–145)

## 2015-10-28 LAB — OCCULT BLOOD X 1 CARD TO LAB, STOOL: Fecal Occult Bld: NEGATIVE

## 2015-10-28 LAB — GLUCOSE, CAPILLARY: GLUCOSE-CAPILLARY: 83 mg/dL (ref 65–99)

## 2015-10-28 LAB — CBC
HEMATOCRIT: 23.6 % — AB (ref 36.0–46.0)
HEMOGLOBIN: 7.8 g/dL — AB (ref 12.0–15.0)
MCH: 28.8 pg (ref 26.0–34.0)
MCHC: 33.1 g/dL (ref 30.0–36.0)
MCV: 87.1 fL (ref 78.0–100.0)
Platelets: 258 10*3/uL (ref 150–400)
RBC: 2.71 MIL/uL — AB (ref 3.87–5.11)
RDW: 14.7 % (ref 11.5–15.5)
WBC: 4 10*3/uL (ref 4.0–10.5)

## 2015-10-28 MED ORDER — IOHEXOL 300 MG/ML  SOLN
100.0000 mL | Freq: Once | INTRAMUSCULAR | Status: AC | PRN
  Administered 2015-10-28: 100 mL via INTRAVENOUS

## 2015-10-28 MED ORDER — IOHEXOL 300 MG/ML  SOLN
25.0000 mL | INTRAMUSCULAR | Status: AC
  Administered 2015-10-28 (×2): 25 mL via ORAL

## 2015-10-28 MED ORDER — MENTHOL 3 MG MT LOZG
1.0000 | LOZENGE | OROMUCOSAL | Status: DC | PRN
  Administered 2015-10-28: 3 mg via ORAL
  Filled 2015-10-28: qty 9

## 2015-10-28 MED ORDER — MAGIC MOUTHWASH W/LIDOCAINE
5.0000 mL | Freq: Three times a day (TID) | ORAL | Status: DC
  Administered 2015-10-28 – 2015-10-29 (×5): 5 mL via ORAL
  Filled 2015-10-28 (×8): qty 5

## 2015-10-28 NOTE — Progress Notes (Signed)
  Subjective: She doesn't really feel better.  Complaining of her throat and looks to have oral candida.  Objective: Vital signs in last 24 hours: Temp:  [98.2 F (36.8 C)-98.3 F (36.8 C)] 98.2 F (36.8 C) (12/09 0532) Pulse Rate:  [76-101] 79 (12/09 0532) Resp:  [17-18] 18 (12/09 0532) BP: (124-144)/(80-94) 140/87 mmHg (12/09 0532) SpO2:  [99 %-100 %] 99 % (12/09 0532) Last BM Date: 10/27/15 120 PO recorded Nothing else aside from IV recorded She is 15 liter positive if you believe I/O Diet: full liquids Afebrile, VSS Labs unchanged Original CT 10/24/15 Intake/Output from previous day: 12/08 0701 - 12/09 0700 In: 3120 [P.O.:120; I.V.:2900; IV Piggyback:100] Out: -  Intake/Output this shift:    General appearance: alert, cooperative, no distress and still feels bloated and sore.   Throat: she looks to have oral candida tongue and throat GI: soft, still tender says it feels like it is blown up.  BM's air and liquid  Lab Results:   Recent Labs  10/27/15 0430 10/28/15 0502  WBC 3.4* 4.0  HGB 7.9* 7.8*  HCT 23.5* 23.6*  PLT 248 258    BMET  Recent Labs  10/27/15 0430 10/28/15 0502  NA 137 136  K 3.5 3.6  CL 110 106  CO2 24 25  GLUCOSE 87 86  BUN <5* <5*  CREATININE 0.81 0.95  CALCIUM 8.3* 8.4*   PT/INR  Recent Labs  10/25/15 0927  LABPROT 18.6*  INR 1.55*     Recent Labs Lab 10/23/15 1549 10/24/15 0048 10/27/15 0430  AST 19 14* 15  ALT 20 16 14   ALKPHOS 59 46 75  BILITOT 1.4* 1.6* 0.8  PROT 6.2* 5.4* 5.3*  ALBUMIN 3.8 3.3* 2.5*     Lipase     Component Value Date/Time   LIPASE 21 10/23/2015 1549     Studies/Results: No results found.  Medications: . famotidine  40 mg Oral Daily  . ferumoxytol  510 mg Intravenous Weekly  . piperacillin-tazobactam (ZOSYN)  IV  3.375 g Intravenous Q8H  . saccharomyces boulardii  250 mg Oral BID  . sodium chloride  3 mL Intravenous Q12H    Assessment/Plan Nausea, vomiting, diarrhea, fever,  abdominal pain, and vaginal bleeding CT with edema and thickening of the small bowel, consistent with enteritis ileus 10/23/15. Appendix is normal on CT Oral candida  Anemia  Family hx of Crohn's disease Polysubstance use Antibiotics: Cipro/Flagyl converted to Zosyn 10/24/15, day 5 today DVT: SCD's/ongoing anemia, no anticoagulant   Plan:  Repeat CT, magic mouth wash with lidocaine.    LOS: 5 days    Maria Sampson 10/28/2015

## 2015-10-28 NOTE — Evaluation (Signed)
Physical Therapy One Time Evaluation Patient Details Name: Maria Sampson MRN: [id number] DOB: [birth year] Today's Date: 10/28/2015   History of Present Illness  24 y.o. female with PMH of depression, appendicitis, who presents with nausea, vomiting, abdominal pain, vaginal bleeding; admitted for enteritis/colitis with sepsis present on admit.  Clinical Impression  Patient evaluated by Physical Therapy with no further acute PT needs identified. All education has been completed and the patient has no further questions. See below for any follow-up Physical or equipment needs. PT is signing off. Thank you for this referral.  Vestibular Exam:  Oculomotor exam negative except for gazing to L side with smooth pursuits (looking up being the worst) and pt reports difficulty focusing and increased blurred vision.  Pt later states she wears corrective lens however does not have them in hospital so encouraged her to try same exercise with lenses on and if not better then taught habituation exercise.  Dix Hallpike testing negative bilaterally.  Pt denies any dizziness/spinning or other symptoms during mobility.     Follow Up Recommendations No PT follow up    Equipment Recommendations  None recommended by PT    Recommendations for Other Services       Precautions / Restrictions Precautions Precautions: None      Mobility  Bed Mobility Overal bed mobility: Independent                Transfers Overall transfer level: Independent                  Ambulation/Gait Ambulation/Gait assistance: Supervision;Modified independent (Device/Increase time) Ambulation Distance (Feet): 160 Feet Assistive device: None Gait Pattern/deviations: WFL(Within Functional Limits)     General Gait Details: slower pace for age, pt pushed IV pole, no dizziness reported, no unsteady gait observed  Stairs            Wheelchair Mobility    Modified Rankin (Stroke Patients Only)        Balance                                             Pertinent Vitals/Pain Pain Assessment: No/denies pain    Home Living Family/patient expects to be discharged to:: Private residence Living Arrangements: Non-relatives/Friends     Home Access: Stairs to enter   Technical brewer of Steps: 3-4 Home Layout: One level Home Equipment: None      Prior Function Level of Independence: Independent               Hand Dominance        Extremity/Trunk Assessment   Upper Extremity Assessment: Overall WFL for tasks assessed           Lower Extremity Assessment: Overall WFL for tasks assessed      Cervical / Trunk Assessment: Normal  Communication   Communication: No difficulties  Cognition Arousal/Alertness: Awake/alert Behavior During Therapy: WFL for tasks assessed/performed Overall Cognitive Status: Within Functional Limits for tasks assessed                      General Comments      Exercises        Assessment/Plan    PT Assessment Patent does not need any further PT services  PT Diagnosis Difficulty walking   PT Problem List    PT Treatment Interventions     PT Goals (  Current goals can be found in the Care Plan section) Acute Rehab PT Goals PT Goal Formulation: All assessment and education complete, DC therapy    Frequency     Barriers to discharge        Co-evaluation               End of Session   Activity Tolerance: Patient tolerated treatment well Patient left: in bed;with call bell/phone within reach           Time: 1112-1126 PT Time Calculation (min) (ACUTE ONLY): 14 min   Charges:   PT Evaluation $Initial PT Evaluation Tier I: 1 Procedure     PT G Codes:        Radhika Dershem,KATHrine E 10/28/2015, 12:03 PM Carmelia Bake, PT, DPT 10/28/2015 Pager: [phone]

## 2015-10-28 NOTE — Progress Notes (Signed)
[facility] PROGRESS NOTE  EUVA MEO [id number] DOB: 1991-04-08 DOA: 10/23/2015 PCP: Pcp Not In System  HPI/Brief narrative HPI on 10/23/2015 by Dr. Ivor Costa Maria Sampson is a 24 y.o. female with PMH of depression, appendicitis X [place], who presents with nausea, vomiting, abdominal pain, vaginal bleeding.  Patient reports that she has been having nausea, vomiting, abdominal pain in the past 2 days. She also has fever and chills. She vomited 3 times without blood in the vomitus. She does not have diarrhea. Her abdominal pain is located in the right lower quadrant, constant, 10 out of 10 in severity, sharp, nonradiating. Patient does not have chest pain, cough, shortness of breath, symptoms of a UTI or unilateral weakness. Patient reports that she has irregular vaginal bleeding today. She had menstrual period last week.  In ED, patient was found to have WBC 12.2, lipase 21, negative urinalysis, temperature 101.2, heart rate 90-100, electrolytes and renal function okay. CT-abdomen/pelvis that showed mild ileus or partial obstruction involving loops of jejunum in the mid abdomen. One of these demonstrates mild diffuse wall thickening, possibly due to infectious or inflammatory enteritis, probable sludge in the gallbladder, sparing the fluid in the phrygian cap and 3. 3.1 x 2.6 cm uterine fibroid.  Assessment/Plan: Enteritis/colitis with sepsis present on admit -Upon admission, patient did have leukocytosis with fever and tachycardia, now resolved -CT abdomen: Mild ileus or partial obstruction involving loops of jejunum in the mid abdomen, and mild diffuse wall thickening, infectious or inflammatory enteritis.  -for now, continue IV Cipro and Flagyl -Continue IV fluids, pain control, antiemetics as needed -Blood cultures negative thus far -Pt reports continued bloating this AM. CT ordered, pending. Concerns for possible Crohn's flare given reported family hx of Crohn's  disease  Abdominal pain, nausea and vomiting -Gen. surgery consultation appreciated, continue IV antibiotics at this time -patient to continue on full liquid diet  Decreased urine output -Despite being on continuous IVF, urine output remains low.  -Multiple bladder scan demonstrates minimal residual urine -?Not sure if patient is going to bathroom on her own. Kidney function normal.  -Will continue to monitor closely -Dr. Ree Kida had spoken with Dr. Justin Mend via phone, who recommended to keep monitoring -Pt clinically euvolemic  Depression -Currently on no home medications  Uterine fibroid with irregular menses -Patient to follow-up with OB/GYN upon discharge -Fibroid noted on CT scan: 3.1 x 2.6 cm uterine fibroid  Normocytic anemia likely secondary to irregular menses -Hemoglobin stable -Continue to monitor CBC -Anemia panel: Iron <5, Ferritin 35  Polysubstance abuse -Drug screen positive for [facility] -Patient denied using any illicit drugs or smoking or alcohol use- states she has not used marijuana in over a month and that her roommate smokes.  Vertigo -Pt describes "room spinning" on position changes that resolves at rest and worse with head movement -Will give trial of meclizine -Consulted PT for vestibular PT  Code Status: Full Family Communication: Pt in room Disposition Plan: D/c home when tolerates at least soft diet and when cleared by Surgery   Consultants:  General Surgery  Procedures:    Antibiotics: Anti-infectives    Start     Dose/Rate Route Frequency Ordered Stop   10/24/15 1200  piperacillin-tazobactam (ZOSYN) IVPB 3.375 g     3.375 g 12.5 mL/hr over 240 Minutes Intravenous Every 8 hours 10/24/15 1043     10/23/15 2145  metroNIDAZOLE (FLAGYL) IVPB 500 mg  Status:  Discontinued     500 mg 100 mL/hr over 60 Minutes  Intravenous Every 8 hours 10/23/15 2133 10/24/15 1012   10/23/15 2045  ciprofloxacin (CIPRO) IVPB 400 mg  Status:  Discontinued     400  mg 200 mL/hr over 60 Minutes Intravenous Every 12 hours 10/23/15 2043 10/24/15 1012   10/23/15 2045  metroNIDAZOLE (FLAGYL) IVPB 500 mg     500 mg 100 mL/hr over 60 Minutes Intravenous  Once 10/23/15 2043 10/23/15 2209      HPI/Subjective: Reports dizziness improved with meclizine but complains of increased bloating and abd discomfort  Objective: Filed Vitals:   10/27/15 1130 10/27/15 1354 10/27/15 2032 10/28/15 0532  BP: 144/93 124/80 144/94 140/87  Pulse: 76 80 101 79  Temp:  98.3 F (36.8 C) 98.2 F (36.8 C) 98.2 F (36.8 C)  TempSrc:  Oral Oral Oral  Resp: 18 17 18 18   Height:      Weight:      SpO2: 100% 100% 100% 99%    Intake/Output Summary (Last 24 hours) at 10/28/15 1200 Last data filed at 10/28/15 1034  Gross per 24 hour  Intake   3000 ml  Output    150 ml  Net   2850 ml   Filed Weights   10/23/15 2245  Weight: 97.6 kg (215 lb 2.7 oz)    Exam:   General:  Awake, laying in bed, in nad  Cardiovascular: regular, s1, s2  Respiratory: normal resp effort, no wheezing  Abdomen: soft, obese, hyperactive BS  Musculoskeletal: perfused, no clubbing, no cyanosis  Data Reviewed: Basic Metabolic Panel:  Recent Labs Lab 10/24/15 0048 10/25/15 0546 10/26/15 0538 10/27/15 0430 10/28/15 0502  NA 132* 136 139 137 136  K 3.3* 3.5 3.1* 3.5 3.6  CL 104 106 109 110 106  CO2 22 21* 26 24 25   GLUCOSE 103* 82 91 87 86  BUN 7 11 5* <5* <5*  CREATININE 0.80 1.00 0.88 0.81 0.95  CALCIUM 7.8* 8.0* 8.3* 8.3* 8.4*  MG  --   --   --  1.8  --    Liver Function Tests:  Recent Labs Lab 10/23/15 1549 10/24/15 0048 10/27/15 0430  AST 19 14* 15  ALT 20 16 14   ALKPHOS 59 46 75  BILITOT 1.4* 1.6* 0.8  PROT 6.2* 5.4* 5.3*  ALBUMIN 3.8 3.3* 2.5*    Recent Labs Lab 10/23/15 1549  LIPASE 21   No results for input(s): AMMONIA in the last 168 hours. CBC:  Recent Labs Lab 10/24/15 0048 10/25/15 0546 10/26/15 0538 10/27/15 0430 10/28/15 0502  WBC 15.2*  8.2 5.0 3.4* 4.0  HGB 9.6* 8.5* 8.2* 7.9* 7.8*  HCT 28.9* 25.5* 25.0* 23.5* 23.6*  MCV 89.5 89.2 88.7 87.0 87.1  PLT 302 240 247 248 258   Cardiac Enzymes: No results for input(s): CKTOTAL, CKMB, CKMBINDEX, TROPONINI in the last 168 hours. BNP (last 3 results) No results for input(s): BNP in the last 8760 hours.  ProBNP (last 3 results) No results for input(s): PROBNP in the last 8760 hours.  CBG:  Recent Labs Lab 10/24/15 0803 10/25/15 0751 10/27/15 0723 10/28/15 0743  GLUCAP 90 82 86 83    Recent Results (from the past 240 hour(s))  Culture, blood (routine x 2)     Status: None (Preliminary result)   Collection Time: 10/23/15 12:48 AM  Result Value Ref Range Status   Specimen Description BLOOD RIGHT ARM  Final   Special Requests BOTTLES DRAWN AEROBIC ONLY 10CC  Final   Culture   Final    NO GROWTH  3 DAYS Performed at [facility]    Report Status PENDING  Incomplete  Culture, blood (routine x 2)     Status: None (Preliminary result)   Collection Time: 10/23/15 10:00 PM  Result Value Ref Range Status   Specimen Description BLOOD RIGHT ANTECUBITAL  Final   Special Requests BOTTLES DRAWN AEROBIC AND ANAEROBIC 5ML  Final   Culture   Final    NO GROWTH 3 DAYS Performed at [facility]    Report Status PENDING  Incomplete     Studies: No results found.  Scheduled Meds: . famotidine  40 mg Oral Daily  . ferumoxytol  510 mg Intravenous Weekly  . magic mouthwash w/lidocaine  5 mL Oral TID AC & HS  . piperacillin-tazobactam (ZOSYN)  IV  3.375 g Intravenous Q8H  . saccharomyces boulardii  250 mg Oral BID  . sodium chloride  3 mL Intravenous Q12H   Continuous Infusions: . sodium chloride 125 mL/hr at 10/28/15 0715    Principal Problem:   Abdominal pain Active Problems:   Depressive disorder   Nausea & vomiting   Sepsis ([place])   Fibroid, uterine   Ileus ([facility])   Normocytic anemia   Malnutrition of moderate degree   BPV (benign positional  vertigo)   [facility], [facility] Pager [phone]. If 7PM-7AM, please contact night-coverage at [email or web address], password [facility] 10/28/2015, 12:00 PM  LOS: 5 days

## 2015-10-29 ENCOUNTER — Inpatient Hospital Stay (HOSPITAL_COMMUNITY): Payer: Medicaid Other

## 2015-10-29 LAB — CBC
HEMATOCRIT: 28.2 % — AB (ref 36.0–46.0)
Hemoglobin: 9.3 g/dL — ABNORMAL LOW (ref 12.0–15.0)
MCH: 28.7 pg (ref 26.0–34.0)
MCHC: 33 g/dL (ref 30.0–36.0)
MCV: 87 fL (ref 78.0–100.0)
Platelets: 330 10*3/uL (ref 150–400)
RBC: 3.24 MIL/uL — ABNORMAL LOW (ref 3.87–5.11)
RDW: 14.6 % (ref 11.5–15.5)
WBC: 6.8 10*3/uL (ref 4.0–10.5)

## 2015-10-29 LAB — CULTURE, BLOOD (ROUTINE X 2)
CULTURE: NO GROWTH
Culture: NO GROWTH

## 2015-10-29 LAB — BASIC METABOLIC PANEL
Anion gap: 6 (ref 5–15)
BUN: <5 mg/dL — ABNORMAL LOW (ref 6–20)
CALCIUM: 9.2 mg/dL (ref 8.9–10.3)
CHLORIDE: 104 mmol/L (ref 101–111)
CO2: 29 mmol/L (ref 22–32)
CREATININE: 0.97 mg/dL (ref 0.44–1.00)
GFR calc Af Amer: >60 mL/min (ref >60)
GFR calc non Af Amer: >60 mL/min (ref >60)
GLUCOSE: 98 mg/dL (ref 65–99)
Potassium: 3.5 mmol/L (ref 3.5–5.1)
Sodium: 139 mmol/L (ref 135–145)

## 2015-10-29 MED ORDER — FAMOTIDINE 40 MG PO TABS
40.0000 mg | ORAL_TABLET | Freq: Every day | ORAL | Status: DC
Start: 2015-10-29 — End: 2018-01-27

## 2015-10-29 MED ORDER — KETOROLAC TROMETHAMINE 10 MG PO TABS: 10.0000 mg | ORAL_TABLET | Freq: Four times a day (QID) | ORAL | Status: DC | PRN

## 2015-10-29 MED ORDER — SACCHAROMYCES BOULARDII 250 MG PO CAPS: 250.0000 mg | ORAL_CAPSULE | Freq: Two times a day (BID) | ORAL | Status: DC

## 2015-10-29 MED ORDER — AMOXICILLIN-POT CLAVULANATE 875-125 MG PO TABS: 1.0000 | ORAL_TABLET | Freq: Two times a day (BID) | ORAL | Status: DC

## 2015-10-29 NOTE — Progress Notes (Signed)
Subjective: She is feeling better.  Still having some vague pain.    Objective: Vital signs in last 24 hours: Temp:  [98 F (36.7 C)-98.6 F (37 C)] 98.6 F (37 C) (12/10 0429) Pulse Rate:  [68-74] 74 (12/10 0429) Resp:  [17-18] 18 (12/10 0429) BP: (128-140)/(84-102) 128/84 mmHg (12/10 0429) SpO2:  [100 %] 100 % (12/10 0429) Last BM Date: 10/28/15 120 PO recorded Nothing else aside from IV recorded She is 15 liter positive if you believe I/O Diet: full liquids Afebrile, VSS Labs unchanged Original CT 10/24/15 Intake/Output from previous day: 12/09 0701 - 12/10 0700 In: 2974.6 [P.O.:750; I.V.:2224.6] Out: 600 [Urine:600] Intake/Output this shift:    General appearance: alert, cooperative, no distress and still feels bloated and sore.   GI: soft, nontender  Lab Results:   Recent Labs  10/28/15 0502 10/29/15 0543  WBC 4.0 6.8  HGB 7.8* 9.3*  HCT 23.6* 28.2*  PLT 258 330    BMET  Recent Labs  10/28/15 0502 10/29/15 0543  NA 136 139  K 3.6 3.5  CL 106 104  CO2 25 29  GLUCOSE 86 98  BUN <5* <5*  CREATININE 0.95 0.97  CALCIUM 8.4* 9.2   PT/INR No results for input(s): LABPROT, INR in the last 72 hours.   Recent Labs Lab 10/23/15 1549 10/24/15 0048 10/27/15 0430  AST 19 14* 15  ALT 20 16 14   ALKPHOS 59 46 75  BILITOT 1.4* 1.6* 0.8  PROT 6.2* 5.4* 5.3*  ALBUMIN 3.8 3.3* 2.5*     Lipase     Component Value Date/Time   LIPASE 21 10/23/2015 1549     Studies/Results: US Transvaginal Non-ob  10/29/2015  CLINICAL DATA:  RIGHT ovarian cyst.  RIGHT pelvic pain for 7 days. EXAM: TRANSABDOMINAL AND TRANSVAGINAL ULTRASOUND OF PELVIS TECHNIQUE: Both transabdominal and transvaginal ultrasound examinations of the pelvis were performed. Transabdominal technique was performed for global imaging of the pelvis including uterus, ovaries, adnexal regions, and pelvic cul-de-sac. It was necessary to proceed with endovaginal exam following the transabdominal exam  to visualize the endometrium and ovaries. COMPARISON:  CT 10/28/2015 FINDINGS: Uterus Measurements: Normal in size and echogenicity measuring 10.6 x 4.6 x 6 point cm. Uterus is retroverted. Two small hypoechoic regions in the uterine fundus measuring 1 cm and 3 cm consistent with leiomyomas. Endometrium Thickness: Normal in thickness for premenopausal female at 13 mm. No focal abnormality visualized. Right ovary Measurements: Normal in size at 5.8 x 3.7 x 4.3 cm. There is a anechoic cyst measuring 3.1 cm x 2.4 x 2.9 cm. No abnormal blood flow or mural nodularity. Left ovary Measurements: Normal in size and echogenicity with small follicles measuring 3.5 x 1.5 x 2.3 cm. Normal appearance/no adnexal mass. Other findings No free fluid. IMPRESSION: 1. Cystic lesion of the RIGHT ovary with benign features is most consistent with a functional ovarian cyst. No specific follow-up recommended in this age group for cysts of this size. This recommendation follows the consensus statement: Management of Asymptomatic Ovarian and Other Adnexal Cysts Imaged at [place]: Society of Radiologists in [place]. Radiology 2010; [phone]. 2. Normal LEFT ovary. 3. Retroverted uterus. 4. Two leiomyoma within the uterine body and fundus. Electronically Signed   By: Suzy Bouchard M.D.   On: 10/29/2015 09:47   US Pelvis Complete  10/29/2015  CLINICAL DATA:  RIGHT ovarian cyst.  RIGHT pelvic pain for 7 days. EXAM: TRANSABDOMINAL AND TRANSVAGINAL ULTRASOUND OF PELVIS TECHNIQUE: Both transabdominal and transvaginal ultrasound examinations of  the pelvis were performed. Transabdominal technique was performed for global imaging of the pelvis including uterus, ovaries, adnexal regions, and pelvic cul-de-sac. It was necessary to proceed with endovaginal exam following the transabdominal exam to visualize the endometrium and ovaries. COMPARISON:  CT 10/28/2015 FINDINGS: Uterus Measurements: Normal in size and  echogenicity measuring 10.6 x 4.6 x 6 point cm. Uterus is retroverted. Two small hypoechoic regions in the uterine fundus measuring 1 cm and 3 cm consistent with leiomyomas. Endometrium Thickness: Normal in thickness for premenopausal female at 13 mm. No focal abnormality visualized. Right ovary Measurements: Normal in size at 5.8 x 3.7 x 4.3 cm. There is a anechoic cyst measuring 3.1 cm x 2.4 x 2.9 cm. No abnormal blood flow or mural nodularity. Left ovary Measurements: Normal in size and echogenicity with small follicles measuring 3.5 x 1.5 x 2.3 cm. Normal appearance/no adnexal mass. Other findings No free fluid. IMPRESSION: 1. Cystic lesion of the RIGHT ovary with benign features is most consistent with a functional ovarian cyst. No specific follow-up recommended in this age group for cysts of this size. This recommendation follows the consensus statement: Management of Asymptomatic Ovarian and Other Adnexal Cysts Imaged at [place]: Society of Radiologists in [place]. Radiology 2010; [phone]. 2. Normal LEFT ovary. 3. Retroverted uterus. 4. Two leiomyoma within the uterine body and fundus. Electronically Signed   By: Suzy Bouchard M.D.   On: 10/29/2015 09:47   Ct Abdomen Pelvis W Contrast  10/28/2015  CLINICAL DATA:  Nausea, vomiting, and abdominal pain for the past 2 days with fever and chills. EXAM: CT ABDOMEN AND PELVIS WITH CONTRAST TECHNIQUE: Multidetector CT imaging of the abdomen and pelvis was performed using the standard protocol following bolus administration of intravenous contrast. CONTRAST:  166mL OMNIPAQUE IOHEXOL 300 MG/ML  SOLN COMPARISON:  10/23/2015 FINDINGS: Lower chest: Moderate right and small left pleural effusions with passive atelectasis, new from the last 5 days. Hepatobiliary: Unremarkable Pancreas: Unremarkable Spleen: Unremarkable Adrenals/Urinary Tract: Unremarkable Stomach/Bowel: Orally administered contrast extends through to the rectum.  Appendix unremarkable. No dilated bowel. Vascular/Lymphatic: 1 cm left external inguinal lymph node, image 82 series 2. Previously this measured 8 mm in diameter. Reproductive: Retroverted uterus, endometrial thickness approximately 1.3 cm which is within normal limits for age. The cyst or corpus luteum in the right ovary, 2.5 cm diameter. Other: Diffuse low-level mesenteric and omental edema. Trace free pelvic fluid. Subcutaneous edema along the abdomen in a bilaterally symmetric manner. Musculoskeletal: Unremarkable IMPRESSION: 1. Borderline dilated loop of left upper quadrant small bowel with normal fold pattern and without fold thickening and without an obvious cause for dilatation. Orally administered contrast extends through to the rectum. No specific inflammation of the terminal ileum, cecum, or appendix. There is a new 2.5 cm in diameter cystic lesion in the right ovary, potentially a corpus luteum, which could be further investigated with pelvic sonography if clinically warranted. No compelling findings for Meckel's diverticulum. 2. There is new third spacing of fluid with subcutaneous and mesenteric edema, trees trace free pelvic fluid, and a moderate right and small left pleural effusion. Electronically Signed   By: Van Clines M.D.   On: 10/28/2015 18:23    Medications: . famotidine  40 mg Oral Daily  . ferumoxytol  510 mg Intravenous Weekly  . magic mouthwash w/lidocaine  5 mL Oral TID AC & HS  . piperacillin-tazobactam (ZOSYN)  IV  3.375 g Intravenous Q8H  . saccharomyces boulardii  250 mg Oral BID  . sodium  chloride  3 mL Intravenous Q12H    Assessment/Plan Nausea, vomiting, diarrhea, fever, abdominal pain, and vaginal bleeding CT with edema and thickening of the small bowel, consistent with enteritis ileus 10/23/15, improved on f/u CT. Appendix is normal on CT Oral candida  Anemia  Family hx of Crohn's disease Polysubstance use Antibiotics: Cipro/Flagyl converted to Zosyn  10/24/15, day 6 today DVT: SCD's/ongoing anemia, no anticoagulant   Plan:  Enteritis appears to be resolving, no surgical issues at this time.  Will sign off.  Please call if any questions or concerns.  Pt may f/u with [place] prn.  Pt should f/u with GI as outpatient to be evaluated for Crohn's and in house if symptoms worsen again.      LOS: 6 days    Jarrett Albor C. [id number]

## 2015-10-29 NOTE — Discharge Summary (Signed)
Physician Discharge Summary  Maria Sampson [id number] DOB: [birth year] DOA: 10/23/2015  PCP: Dr. Madison Hickman  Admit date: 10/23/2015 Discharge date: 10/29/2015  Time spent: 20 minutes  Recommendations for Outpatient Follow-up:  1. Follow up with  PCP in 1-2 weeks 2. Follow up with Ob/Gyn to follow up on fibroids 3. Consider outpatient GI follow up given family history of Crohn's  Discharge Diagnoses:  Principal Problem:   Abdominal pain Active Problems:   Depressive disorder   Nausea & vomiting   Sepsis ([place])   Fibroid, uterine   Ileus ([facility])   Normocytic anemia   Malnutrition of moderate degree   BPV (benign positional vertigo)  Discharge Condition: Improved  Diet recommendation: Full liquid, advance as tolerated  Filed Weights   10/23/15 2245  Weight: 97.6 kg (215 lb 2.7 oz)    History of present illness:  Please review dictated H and P from 12/4 for details. Briefly, 24 y.o. female with PMH of depression, appendicitis X [place], who presents with nausea, vomiting, abdominal pain, vaginal bleeding  Hospital Course:  Enteritis/colitis with sepsis present on admit -Upon admission, patient did have leukocytosis with fever and tachycardia, now resolved -CT abdomen: Initial imaging with mild ileus or partial obstruction involving loops of jejunum in the mid abdomen, and mild diffuse wall thickening, infectious or inflammatory enteritis.  -Initially continued IV Cipro and Flagyl and later zosyn -Blood cultures negative thus far -Repeat CT without signs of bowel inflammation. Pt was seen by surgery and cleared for discharge with close outpatient follow up  Abdominal pain, nausea and vomiting -Gen. surgery consultation appreciated -patient to continue on full liquid diet, eventually advance as tolerated  Decreased urine output -Despite being on continuous IVF, urine output remained low.  -Multiple bladder scans demonstrates minimal residual urine -?Not sure if  patient is going to bathroom on her own. Kidney function remained normal.  -Dr. Ree Kida had spoken with Dr. Justin Mend via phone, who recommended to keep monitoring -Pt clinically euvolemic with good urine output note later  Depression -Currently on no home medications  Uterine fibroid with irregular menses -Patient to follow-up with OB/GYN upon discharge -Fibroid noted on CT scan: 3.1 x 2.6 cm uterine fibroid  Normocytic anemia likely secondary to irregular menses -Hemoglobin stable -Continue to monitor CBC -Anemia panel: Iron <5, Ferritin 35  Polysubstance abuse -Drug screen positive for [facility] -Patient denied using any illicit drugs or smoking or alcohol use- states she has not used marijuana in over a month and that her roommate smokes.  Vertigo -Pt describes "room spinning" on position changes that resolves at rest and worse with head movement -Improved with trial of meclizine -Consulted PT for vestibular PT, however Marye Round noted to be negative B  Consultations:  General surgery  Discharge Exam: Filed Vitals:   10/28/15 0532 10/28/15 1515 10/28/15 2016 10/29/15 0429  BP: 140/87 130/102 140/88 128/84  Pulse: 79 74 68 74  Temp: 98.2 F (36.8 C) 98 F (36.7 C) 98.1 F (36.7 C) 98.6 F (37 C)  TempSrc: Oral Oral Oral Oral  Resp: 18 17 18 18   Height:      Weight:      SpO2: 99% 100% 100% 100%    General: awake, in nad Cardiovascular: regular, s1, s2 Respiratory: normal resp effort, no wheezing  Discharge Instructions     Medication List    STOP taking these medications        ibuprofen 800 MG tablet  Commonly known as:  ADVIL,MOTRIN  TAKE these medications        amoxicillin-clavulanate 875-125 MG tablet  Commonly known as:  AUGMENTIN  Take 1 tablet by mouth 2 (two) times daily.     famotidine 40 MG tablet  Commonly known as:  PEPCID  Take 1 tablet (40 mg total) by mouth daily.     ketorolac 10 MG tablet  Commonly known as:  TORADOL  Take 1  tablet (10 mg total) by mouth every 6 (six) hours as needed.     polyethylene glycol packet  Commonly known as:  MIRALAX  Take 17 g by mouth daily.     saccharomyces boulardii 250 MG capsule  Commonly known as:  FLORASTOR  Take 1 capsule (250 mg total) by mouth 2 (two) times daily.       Allergies  Allergen Reactions  . Peanut-Containing Drug Products Anaphylaxis and Hives  . Food Hives, Itching and Swelling    PEANUTS   Follow-up Information    Schedule an appointment as soon as possible for a visit with [facility].   Why:  Hospital follow up   Contact information:   [place] [phone]      Schedule an appointment as soon as possible for a visit with Zigmund Gottron, MD.   Specialty:  Family Medicine   Why:  Hospital follow up   Contact information:   [place] [address] [phone]        The results of significant diagnostics from this hospitalization (including imaging, microbiology, ancillary and laboratory) are listed below for reference.    Significant Diagnostic Studies: US Transvaginal Non-ob  11-17-15  CLINICAL DATA:  RIGHT ovarian cyst.  RIGHT pelvic pain for 7 days. EXAM: TRANSABDOMINAL AND TRANSVAGINAL ULTRASOUND OF PELVIS TECHNIQUE: Both transabdominal and transvaginal ultrasound examinations of the pelvis were performed. Transabdominal technique was performed for global imaging of the pelvis including uterus, ovaries, adnexal regions, and pelvic cul-de-sac. It was necessary to proceed with endovaginal exam following the transabdominal exam to visualize the endometrium and ovaries. COMPARISON:  CT 10/28/2015 FINDINGS: Uterus Measurements: Normal in size and echogenicity measuring 10.6 x 4.6 x 6 point cm. Uterus is retroverted. Two small hypoechoic regions in the uterine fundus measuring 1 cm and 3 cm consistent with leiomyomas. Endometrium Thickness: Normal in  thickness for premenopausal female at 13 mm. No focal abnormality visualized. Right ovary Measurements: Normal in size at 5.8 x 3.7 x 4.3 cm. There is a anechoic cyst measuring 3.1 cm x 2.4 x 2.9 cm. No abnormal blood flow or mural nodularity. Left ovary Measurements: Normal in size and echogenicity with small follicles measuring 3.5 x 1.5 x 2.3 cm. Normal appearance/no adnexal mass. Other findings No free fluid. IMPRESSION: 1. Cystic lesion of the RIGHT ovary with benign features is most consistent with a functional ovarian cyst. No specific follow-up recommended in this age group for cysts of this size. This recommendation follows the consensus statement: Management of Asymptomatic Ovarian and Other Adnexal Cysts Imaged at [place]: Society of Radiologists in [place]. Radiology 2010; [phone]. 2. Normal LEFT ovary. 3. Retroverted uterus. 4. Two leiomyoma within the uterine body and fundus. Electronically Signed   By: Suzy Bouchard M.D.   On: 11-17-2015 09:47   US Pelvis Complete  2015/11/17  CLINICAL DATA:  RIGHT ovarian cyst.  RIGHT pelvic pain for 7 days. EXAM: TRANSABDOMINAL AND TRANSVAGINAL ULTRASOUND OF PELVIS TECHNIQUE: Both transabdominal and transvaginal ultrasound examinations of the pelvis  were performed. Transabdominal technique was performed for global imaging of the pelvis including uterus, ovaries, adnexal regions, and pelvic cul-de-sac. It was necessary to proceed with endovaginal exam following the transabdominal exam to visualize the endometrium and ovaries. COMPARISON:  CT 10/28/2015 FINDINGS: Uterus Measurements: Normal in size and echogenicity measuring 10.6 x 4.6 x 6 point cm. Uterus is retroverted. Two small hypoechoic regions in the uterine fundus measuring 1 cm and 3 cm consistent with leiomyomas. Endometrium Thickness: Normal in thickness for premenopausal female at 13 mm. No focal abnormality visualized. Right ovary Measurements: Normal in size at 5.8  x 3.7 x 4.3 cm. There is a anechoic cyst measuring 3.1 cm x 2.4 x 2.9 cm. No abnormal blood flow or mural nodularity. Left ovary Measurements: Normal in size and echogenicity with small follicles measuring 3.5 x 1.5 x 2.3 cm. Normal appearance/no adnexal mass. Other findings No free fluid. IMPRESSION: 1. Cystic lesion of the RIGHT ovary with benign features is most consistent with a functional ovarian cyst. No specific follow-up recommended in this age group for cysts of this size. This recommendation follows the consensus statement: Management of Asymptomatic Ovarian and Other Adnexal Cysts Imaged at [place]: Society of Radiologists in [place]. Radiology 2010; [id number]. 2. Normal LEFT ovary. 3. Retroverted uterus. 4. Two leiomyoma within the uterine body and fundus. Electronically Signed   By: Suzy Bouchard M.D.   On: 10/29/2015 09:47   Ct Abdomen Pelvis W Contrast  10/28/2015  CLINICAL DATA:  Nausea, vomiting, and abdominal pain for the past 2 days with fever and chills. EXAM: CT ABDOMEN AND PELVIS WITH CONTRAST TECHNIQUE: Multidetector CT imaging of the abdomen and pelvis was performed using the standard protocol following bolus administration of intravenous contrast. CONTRAST:  125mL OMNIPAQUE IOHEXOL 300 MG/ML  SOLN COMPARISON:  10/23/2015 FINDINGS: Lower chest: Moderate right and small left pleural effusions with passive atelectasis, new from the last 5 days. Hepatobiliary: Unremarkable Pancreas: Unremarkable Spleen: Unremarkable Adrenals/Urinary Tract: Unremarkable Stomach/Bowel: Orally administered contrast extends through to the rectum. Appendix unremarkable. No dilated bowel. Vascular/Lymphatic: 1 cm left external inguinal lymph node, image 82 series 2. Previously this measured 8 mm in diameter. Reproductive: Retroverted uterus, endometrial thickness approximately 1.3 cm which is within normal limits for age. The cyst or corpus luteum in the right ovary, 2.5 cm  diameter. Other: Diffuse low-level mesenteric and omental edema. Trace free pelvic fluid. Subcutaneous edema along the abdomen in a bilaterally symmetric manner. Musculoskeletal: Unremarkable IMPRESSION: 1. Borderline dilated loop of left upper quadrant small bowel with normal fold pattern and without fold thickening and without an obvious cause for dilatation. Orally administered contrast extends through to the rectum. No specific inflammation of the terminal ileum, cecum, or appendix. There is a new 2.5 cm in diameter cystic lesion in the right ovary, potentially a corpus luteum, which could be further investigated with pelvic sonography if clinically warranted. No compelling findings for Meckel's diverticulum. 2. There is new third spacing of fluid with subcutaneous and mesenteric edema, trees trace free pelvic fluid, and a moderate right and small left pleural effusion. Electronically Signed   By: Van Clines M.D.   On: 10/28/2015 18:23   Ct Abdomen Pelvis W Contrast  10/24/2015  ADDENDUM REPORT: 10/24/2015 10:15 ADDENDUM: I reviewed this case with Dr. Jackolyn Confer on 10/24/2015 with respect to the appendix. The appendix is visualized, best seen on the coronal images and appears normal. In addition to the mild small bowel distension described, there is possible diffuse colonic  wall thickening, although the colon is relatively decompressed. This could reflect a mild colitis in this patient with diarrhea. Electronically Signed   By: Richardean Sale M.D.   On: 10/24/2015 10:15  10/24/2015  CLINICAL DATA:  Right lower quadrant abdominal pain, nausea and vomiting for the past 2 days. Irregular vaginal bleeding this past week. EXAM: CT ABDOMEN AND PELVIS WITH CONTRAST TECHNIQUE: Multidetector CT imaging of the abdomen and pelvis was performed using the standard protocol following bolus administration of intravenous contrast. CONTRAST:  170mL OMNIPAQUE IOHEXOL 300 MG/ML  SOLN COMPARISON:  Previous  examinations, the most recent dated 05/10/2015. FINDINGS: Lower chest: 4 mm subpleural nodule in the left lower lobe on image 6. This unchanged since 05/01/2011, compatible with a benign nodule. Hepatobiliary: Again demonstrated is a gallbladder Phrygian cap. This contains lower density fluid than the remainder of the gallbladder. Normal appearing liver. Pancreas: No mass, inflammatory changes, or other significant abnormality. Spleen: Within normal limits in size and appearance. Adrenals/Urinary Tract: No masses identified. No evidence of hydronephrosis. Stomach/Bowel: Multiple dilated loops of jejunum in the mid abdomen. One of these demonstrates diffuse wall thickening on image number 45 of series 2. Normal caliber distal small bowel and colon. No gastric dilatation. No obstructing mass or hernia seen. Vascular/Lymphatic: No pathologically enlarged lymph nodes. No evidence of abdominal aortic aneurysm. Reproductive: Retroflexed uterus with the previously noted anterior uterine mass. The mass currently measures 3.1 x 2.6 cm on sagittal image number 67. Other: Small amount of free peritoneal fluid in the pelvic cul-de-sac, within normal limits of physiological fluid. Musculoskeletal:  Normal appearing bones. IMPRESSION: 1. Mild ileus or partial obstruction involving loops of jejunum in the mid abdomen. One of these demonstrates mild diffuse wall thickening, possibly due to infectious or inflammatory enteritis. 2. Probable sludge in the gallbladder, sparing the fluid in the Phrygian cap. 3. 3.1 x 2.6 cm uterine fibroid. Electronically Signed: By: Claudie Revering M.D. On: 10/23/2015 19:45    Microbiology: Recent Results (from the past 240 hour(s))  Culture, blood (routine x 2)     Status: None   Collection Time: 10/23/15 12:48 AM  Result Value Ref Range Status   Specimen Description BLOOD RIGHT ARM  Final   Special Requests BOTTLES DRAWN AEROBIC ONLY 10CC  Final   Culture   Final    NO GROWTH 5  DAYS Performed at [facility]    Report Status 10/29/2015 FINAL  Final  Culture, blood (routine x 2)     Status: None   Collection Time: 10/23/15 10:00 PM  Result Value Ref Range Status   Specimen Description BLOOD RIGHT ANTECUBITAL  Final   Special Requests BOTTLES DRAWN AEROBIC AND ANAEROBIC 5ML  Final   Culture   Final    NO GROWTH 5 DAYS Performed at [facility]    Report Status 10/29/2015 FINAL  Final     Labs: Basic Metabolic Panel:  Recent Labs Lab 10/25/15 0546 10/26/15 0538 10/27/15 0430 10/28/15 0502 10/29/15 0543  NA 136 139 137 136 139  K 3.5 3.1* 3.5 3.6 3.5  CL 106 109 110 106 104  CO2 21* 26 24 25 29   GLUCOSE 82 91 87 86 98  BUN 11 5* <5* <5* <5*  CREATININE 1.00 0.88 0.81 0.95 0.97  CALCIUM 8.0* 8.3* 8.3* 8.4* 9.2  MG  --   --  1.8  --   --    Liver Function Tests:  Recent Labs Lab 10/23/15 1549 10/24/15 0048 10/27/15 0430  AST 19 14* 15  ALT 20 16 14   ALKPHOS 59 46 75  BILITOT 1.4* 1.6* 0.8  PROT 6.2* 5.4* 5.3*  ALBUMIN 3.8 3.3* 2.5*    Recent Labs Lab 10/23/15 1549  LIPASE 21   No results for input(s): AMMONIA in the last 168 hours. CBC:  Recent Labs Lab 10/25/15 0546 10/26/15 0538 10/27/15 0430 10/28/15 0502 10/29/15 0543  WBC 8.2 5.0 3.4* 4.0 6.8  HGB 8.5* 8.2* 7.9* 7.8* 9.3*  HCT 25.5* 25.0* 23.5* 23.6* 28.2*  MCV 89.2 88.7 87.0 87.1 87.0  PLT 240 247 248 258 330   Cardiac Enzymes: No results for input(s): CKTOTAL, CKMB, CKMBINDEX, TROPONINI in the last 168 hours. BNP: BNP (last 3 results) No results for input(s): BNP in the last 8760 hours.  ProBNP (last 3 results) No results for input(s): PROBNP in the last 8760 hours.  CBG:  Recent Labs Lab 10/24/15 0803 10/25/15 0751 10/27/15 0723 10/28/15 0743  GLUCAP 90 82 86 83       Signed:  Jonmichael Beadnell K  [facility] 10/29/2015, 4:25 PM

## 2015-10-29 NOTE — Progress Notes (Signed)
Reviewed discharge information with patient. Answered all questions. Patient able to teach back medications and reasons to contact MD/911. Patient verbalizes importance of PCP follow up appointment.  Meilin Brosh M. Marykate Heuberger, RN   

## 2015-10-30 ENCOUNTER — Emergency Department (HOSPITAL_COMMUNITY)
Admission: EM | Admit: 2015-10-30 | Discharge: 2015-10-30 | Disposition: A | Discharge status: Home / Self Care | Payer: Medicaid Other | Attending: Emergency Medicine | Admitting: Emergency Medicine

## 2015-10-30 ENCOUNTER — Emergency Department (HOSPITAL_COMMUNITY): Payer: Medicaid Other

## 2015-10-30 ENCOUNTER — Encounter (HOSPITAL_COMMUNITY): Payer: Self-pay | Admitting: Emergency Medicine

## 2015-10-30 DIAGNOSIS — Z3202 Encounter for pregnancy test, result negative: Secondary | ICD-10-CM | POA: Insufficient documentation

## 2015-10-30 DIAGNOSIS — Z8619 Personal history of other infectious and parasitic diseases: Secondary | ICD-10-CM | POA: Insufficient documentation

## 2015-10-30 DIAGNOSIS — Z8669 Personal history of other diseases of the nervous system and sense organs: Secondary | ICD-10-CM | POA: Insufficient documentation

## 2015-10-30 DIAGNOSIS — R509 Fever, unspecified: Secondary | ICD-10-CM | POA: Insufficient documentation

## 2015-10-30 DIAGNOSIS — Z79899 Other long term (current) drug therapy: Secondary | ICD-10-CM | POA: Insufficient documentation

## 2015-10-30 DIAGNOSIS — R197 Diarrhea, unspecified: Secondary | ICD-10-CM | POA: Insufficient documentation

## 2015-10-30 DIAGNOSIS — Z792 Long term (current) use of antibiotics: Secondary | ICD-10-CM | POA: Insufficient documentation

## 2015-10-30 DIAGNOSIS — R1031 Right lower quadrant pain: Secondary | ICD-10-CM

## 2015-10-30 DIAGNOSIS — R112 Nausea with vomiting, unspecified: Secondary | ICD-10-CM | POA: Insufficient documentation

## 2015-10-30 DIAGNOSIS — Z87891 Personal history of nicotine dependence: Secondary | ICD-10-CM | POA: Insufficient documentation

## 2015-10-30 DIAGNOSIS — Z8659 Personal history of other mental and behavioral disorders: Secondary | ICD-10-CM | POA: Insufficient documentation

## 2015-10-30 LAB — COMPREHENSIVE METABOLIC PANEL
ALT: 18 U/L (ref 14–54)
AST: 22 U/L (ref 15–41)
Albumin: 3.1 g/dL — ABNORMAL LOW (ref 3.5–5.0)
Alkaline Phosphatase: 80 U/L (ref 38–126)
Anion gap: 8 (ref 5–15)
BUN: <5 mg/dL — ABNORMAL LOW (ref 6–20)
CHLORIDE: 106 mmol/L (ref 101–111)
CO2: 25 mmol/L (ref 22–32)
CREATININE: 0.83 mg/dL (ref 0.44–1.00)
Calcium: 9 mg/dL (ref 8.9–10.3)
GFR calc non Af Amer: >60 mL/min (ref >60)
Glucose, Bld: 91 mg/dL (ref 65–99)
POTASSIUM: 3.8 mmol/L (ref 3.5–5.1)
SODIUM: 139 mmol/L (ref 135–145)
Total Bilirubin: 0.7 mg/dL (ref 0.3–1.2)
Total Protein: 5.9 g/dL — ABNORMAL LOW (ref 6.5–8.1)

## 2015-10-30 LAB — CBC WITH DIFFERENTIAL/PLATELET
BASOS ABS: 0 10*3/uL (ref 0.0–0.1)
Basophils Relative: 0 %
EOS ABS: 0.1 10*3/uL (ref 0.0–0.7)
EOS PCT: 2 %
HCT: 29.7 % — ABNORMAL LOW (ref 36.0–46.0)
Hemoglobin: 10 g/dL — ABNORMAL LOW (ref 12.0–15.0)
LYMPHS ABS: 1.1 10*3/uL (ref 0.7–4.0)
Lymphocytes Relative: 17 %
MCH: 29.2 pg (ref 26.0–34.0)
MCHC: 33.7 g/dL (ref 30.0–36.0)
MCV: 86.8 fL (ref 78.0–100.0)
Monocytes Absolute: 0.7 10*3/uL (ref 0.1–1.0)
Monocytes Relative: 11 %
Neutro Abs: 4.6 10*3/uL (ref 1.7–7.7)
Neutrophils Relative %: 70 %
PLATELETS: 364 10*3/uL (ref 150–400)
RBC: 3.42 MIL/uL — AB (ref 3.87–5.11)
RDW: 14.5 % (ref 11.5–15.5)
WBC: 6.6 10*3/uL (ref 4.0–10.5)

## 2015-10-30 LAB — URINALYSIS, ROUTINE W REFLEX MICROSCOPIC
BILIRUBIN URINE: NEGATIVE
Glucose, UA: NEGATIVE mg/dL
Hgb urine dipstick: NEGATIVE
KETONES UR: NEGATIVE mg/dL
Leukocytes, UA: NEGATIVE
NITRITE: NEGATIVE
Protein, ur: NEGATIVE mg/dL
Specific Gravity, Urine: 1.01 (ref 1.005–1.030)
pH: 8.5 — ABNORMAL HIGH (ref 5.0–8.0)

## 2015-10-30 LAB — LIPASE, BLOOD: Lipase: 25 U/L (ref 11–51)

## 2015-10-30 LAB — POC URINE PREG, ED: PREG TEST UR: NEGATIVE

## 2015-10-30 MED ORDER — MORPHINE SULFATE (PF) 4 MG/ML IV SOLN
8.0000 mg | Freq: Once | INTRAVENOUS | Status: AC
  Administered 2015-10-30: 8 mg via INTRAVENOUS
  Filled 2015-10-30: qty 2

## 2015-10-30 MED ORDER — PROMETHAZINE HCL 25 MG PO TABS: 25.0000 mg | ORAL_TABLET | Freq: Four times a day (QID) | ORAL | Status: DC | PRN

## 2015-10-30 MED ORDER — ONDANSETRON HCL 4 MG/2ML IJ SOLN
4.0000 mg | Freq: Once | INTRAMUSCULAR | Status: AC
  Administered 2015-10-30: 4 mg via INTRAVENOUS
  Filled 2015-10-30: qty 2

## 2015-10-30 MED ORDER — OXYCODONE-ACETAMINOPHEN 5-325 MG PO TABS: 1.0000 | ORAL_TABLET | ORAL | Status: DC | PRN

## 2015-10-30 MED ORDER — SODIUM CHLORIDE 0.9 % IV BOLUS (SEPSIS)
1000.0000 mL | Freq: Once | INTRAVENOUS | Status: AC
  Administered 2015-10-30: 1000 mL via INTRAVENOUS

## 2015-10-30 NOTE — ED Notes (Signed)
Bed: [id number] Expected date: 10/30/15 Expected time: 9:44 AM Means of arrival: Ambulance Comments: Bowel obstruction

## 2015-10-30 NOTE — ED Notes (Signed)
Per EMS: Pt states she was released yesterday after being admitted.  Pt describes being dx w/ an SBO.  Pt states that she was not able to get her rx filled after leaving.  Pt c/o NVD since this morning with RLQ pain and tenderness.  20 g R AC.

## 2015-10-30 NOTE — ED Notes (Signed)
Pt I & O to obtain urine, tolerated well, assisted by Claiborne Billings EMT.

## 2015-10-30 NOTE — ED Notes (Signed)
Patient unable to give urine sample at this time

## 2015-10-30 NOTE — ED Provider Notes (Signed)
CSN: [id number]     Arrival date & time 10/30/15  [id number] History   First MD Initiated Contact with Patient 10/30/15 1000     Chief Complaint  Patient presents with  . Abdominal Pain  . Nausea  . Emesis  . Diarrhea     (Consider location/radiation/quality/duration/timing/severity/associated sxs/prior Treatment) HPI  24 year old female presents with worsening right lower abdominal pain. Patient was discharged from the hospital yesterday after being admitted for 5 days for a ileus versus small bowel obstruction. At that time patient was having right lower quadrant abdominal pain 2. She had 2 CT scans that both showed normal appendix and there was a question of an ovarian cyst which was confirmed on ultrasound. Patient states when she left yesterday her pain was better but it had never gone away. Around 10 PM it seemed to get much worse and has been severe since. It is sharp and stabbing. Has felt nauseated and vomited once but no other vomiting. Any time she urinates or defecates the pain worsens. There is no specific dysuria however. She states her stools are watery and loose but only small amount are coming out. States she had a fever in the middle the night with a temperature of 100.3. Took Tylenol for this. She is currently laying on her left side as she states this is the only position that is somewhat comfortable.   Past Medical History  Diagnosis Date  . Otitis externa due to Pseudomonas aeruginosa 04/2014  . Depression    No past surgical history on file. Family History  Problem Relation Age of Onset  . Crohn's disease Other    Social History  Substance Use Topics  . Smoking status: Former Smoker    Types: Cigarettes    Quit date: 03/03/2014  . Smokeless tobacco: Never Used  . Alcohol Use: Yes     Comment: social   OB History    No data available     Review of Systems  Constitutional: Positive for fever.  Gastrointestinal: Positive for nausea, vomiting and abdominal pain.   Genitourinary: Negative for dysuria.  All other systems reviewed and are negative.     Allergies  Peanut-containing drug products and Food  Home Medications   Prior to Admission medications   Medication Sig Start Date End Date Taking? Authorizing Provider  amoxicillin-clavulanate (AUGMENTIN) 875-125 MG tablet Take 1 tablet by mouth 2 (two) times daily. 10/29/15   Donne Hazel, MD  famotidine (PEPCID) 40 MG tablet Take 1 tablet (40 mg total) by mouth daily. 10/29/15   Donne Hazel, MD  ketorolac (TORADOL) 10 MG tablet Take 1 tablet (10 mg total) by mouth every 6 (six) hours as needed. 10/29/15   Donne Hazel, MD  polyethylene glycol [facility]) packet Take 17 g by mouth daily. Patient not taking: Reported on 05/24/2015 05/10/15   Charlann Lange, PA-C  saccharomyces boulardii (FLORASTOR) 250 MG capsule Take 1 capsule (250 mg total) by mouth 2 (two) times daily. 10/29/15   Donne Hazel, MD   BP 162/107 mmHg  Pulse 69  Temp(Src) 98.6 F (37 C) (Oral)  Resp 22  SpO2 100%  LMP 10/16/2015 (Approximate) Physical Exam  Constitutional: She is oriented to person, place, and time. She appears well-developed and well-nourished. No distress.  Laying on her left side  HENT:  Head: Normocephalic and atraumatic.  Right Ear: External ear normal.  Left Ear: External ear normal.  Nose: Nose normal.  Eyes: Right eye exhibits no discharge. Left eye exhibits  no discharge.  Cardiovascular: Normal rate, regular rhythm and normal heart sounds.   Pulmonary/Chest: Effort normal and breath sounds normal.  Abdominal: Soft. There is tenderness in the right lower quadrant. There is no rigidity and no guarding.  Neurological: She is alert and oriented to person, place, and time.  Skin: Skin is warm and dry. She is not diaphoretic.  Nursing note and vitals reviewed.   ED Course  Procedures (including critical care time) Labs Review Labs Reviewed  URINALYSIS, ROUTINE W REFLEX MICROSCOPIC (NOT AT  [facility]) - Abnormal; Notable for the following:    pH 8.5 (*)    All other components within normal limits  CBC WITH DIFFERENTIAL/PLATELET - Abnormal; Notable for the following:    RBC 3.42 (*)    Hemoglobin 10.0 (*)    HCT 29.7 (*)    All other components within normal limits  COMPREHENSIVE METABOLIC PANEL - Abnormal; Notable for the following:    BUN <5 (*)    Total Protein 5.9 (*)    Albumin 3.1 (*)    All other components within normal limits  LIPASE, BLOOD  POC URINE PREG, ED    Imaging Review US Transvaginal Non-ob  10/29/2015  CLINICAL DATA:  RIGHT ovarian cyst.  RIGHT pelvic pain for 7 days. EXAM: TRANSABDOMINAL AND TRANSVAGINAL ULTRASOUND OF PELVIS TECHNIQUE: Both transabdominal and transvaginal ultrasound examinations of the pelvis were performed. Transabdominal technique was performed for global imaging of the pelvis including uterus, ovaries, adnexal regions, and pelvic cul-de-sac. It was necessary to proceed with endovaginal exam following the transabdominal exam to visualize the endometrium and ovaries. COMPARISON:  CT 10/28/2015 FINDINGS: Uterus Measurements: Normal in size and echogenicity measuring 10.6 x 4.6 x 6 point cm. Uterus is retroverted. Two small hypoechoic regions in the uterine fundus measuring 1 cm and 3 cm consistent with leiomyomas. Endometrium Thickness: Normal in thickness for premenopausal female at 13 mm. No focal abnormality visualized. Right ovary Measurements: Normal in size at 5.8 x 3.7 x 4.3 cm. There is a anechoic cyst measuring 3.1 cm x 2.4 x 2.9 cm. No abnormal blood flow or mural nodularity. Left ovary Measurements: Normal in size and echogenicity with small follicles measuring 3.5 x 1.5 x 2.3 cm. Normal appearance/no adnexal mass. Other findings No free fluid. IMPRESSION: 1. Cystic lesion of the RIGHT ovary with benign features is most consistent with a functional ovarian cyst. No specific follow-up recommended in this age group for cysts of this size.  This recommendation follows the consensus statement: Management of Asymptomatic Ovarian and Other Adnexal Cysts Imaged at [place]: Society of Radiologists in [place]. Radiology 2010; [phone]. 2. Normal LEFT ovary. 3. Retroverted uterus. 4. Two leiomyoma within the uterine body and fundus. Electronically Signed   By: Suzy Bouchard M.D.   On: 10/29/2015 09:47   US Pelvis Complete  10/29/2015  CLINICAL DATA:  RIGHT ovarian cyst.  RIGHT pelvic pain for 7 days. EXAM: TRANSABDOMINAL AND TRANSVAGINAL ULTRASOUND OF PELVIS TECHNIQUE: Both transabdominal and transvaginal ultrasound examinations of the pelvis were performed. Transabdominal technique was performed for global imaging of the pelvis including uterus, ovaries, adnexal regions, and pelvic cul-de-sac. It was necessary to proceed with endovaginal exam following the transabdominal exam to visualize the endometrium and ovaries. COMPARISON:  CT 10/28/2015 FINDINGS: Uterus Measurements: Normal in size and echogenicity measuring 10.6 x 4.6 x 6 point cm. Uterus is retroverted. Two small hypoechoic regions in the uterine fundus measuring 1 cm and 3 cm consistent with leiomyomas. Endometrium Thickness: Normal  in thickness for premenopausal female at 13 mm. No focal abnormality visualized. Right ovary Measurements: Normal in size at 5.8 x 3.7 x 4.3 cm. There is a anechoic cyst measuring 3.1 cm x 2.4 x 2.9 cm. No abnormal blood flow or mural nodularity. Left ovary Measurements: Normal in size and echogenicity with small follicles measuring 3.5 x 1.5 x 2.3 cm. Normal appearance/no adnexal mass. Other findings No free fluid. IMPRESSION: 1. Cystic lesion of the RIGHT ovary with benign features is most consistent with a functional ovarian cyst. No specific follow-up recommended in this age group for cysts of this size. This recommendation follows the consensus statement: Management of Asymptomatic Ovarian and Other Adnexal Cysts Imaged at [place]:  Society of Radiologists in [place]. Radiology 2010; [phone]. 2. Normal LEFT ovary. 3. Retroverted uterus. 4. Two leiomyoma within the uterine body and fundus. Electronically Signed   By: Suzy Bouchard M.D.   On: 10/29/2015 09:47   Ct Abdomen Pelvis W Contrast  10/28/2015  CLINICAL DATA:  Nausea, vomiting, and abdominal pain for the past 2 days with fever and chills. EXAM: CT ABDOMEN AND PELVIS WITH CONTRAST TECHNIQUE: Multidetector CT imaging of the abdomen and pelvis was performed using the standard protocol following bolus administration of intravenous contrast. CONTRAST:  121mL OMNIPAQUE IOHEXOL 300 MG/ML  SOLN COMPARISON:  10/23/2015 FINDINGS: Lower chest: Moderate right and small left pleural effusions with passive atelectasis, new from the last 5 days. Hepatobiliary: Unremarkable Pancreas: Unremarkable Spleen: Unremarkable Adrenals/Urinary Tract: Unremarkable Stomach/Bowel: Orally administered contrast extends through to the rectum. Appendix unremarkable. No dilated bowel. Vascular/Lymphatic: 1 cm left external inguinal lymph node, image 82 series 2. Previously this measured 8 mm in diameter. Reproductive: Retroverted uterus, endometrial thickness approximately 1.3 cm which is within normal limits for age. The cyst or corpus luteum in the right ovary, 2.5 cm diameter. Other: Diffuse low-level mesenteric and omental edema. Trace free pelvic fluid. Subcutaneous edema along the abdomen in a bilaterally symmetric manner. Musculoskeletal: Unremarkable IMPRESSION: 1. Borderline dilated loop of left upper quadrant small bowel with normal fold pattern and without fold thickening and without an obvious cause for dilatation. Orally administered contrast extends through to the rectum. No specific inflammation of the terminal ileum, cecum, or appendix. There is a new 2.5 cm in diameter cystic lesion in the right ovary, potentially a corpus luteum, which could be further  investigated with pelvic sonography if clinically warranted. No compelling findings for Meckel's diverticulum. 2. There is new third spacing of fluid with subcutaneous and mesenteric edema, trees trace free pelvic fluid, and a moderate right and small left pleural effusion. Electronically Signed   By: Van Clines M.D.   On: 10/28/2015 18:23   Dg Abd Acute W/chest  10/30/2015  CLINICAL DATA:  One week history of lower abdominal pain.  Diarrhea. EXAM: DG ABDOMEN ACUTE W/ 1V CHEST COMPARISON:  Chest radiograph November 08, 2013; CT abdomen and pelvis October 28, 2015 FINDINGS: PA chest: There is no edema or consolidation. The heart size and pulmonary vascularity are normal. No adenopathy. Supine and upright abdomen: There is no bowel dilatation or air-fluid level suggesting obstruction. No free air. There are small phleboliths in the pelvis. IMPRESSION: Bowel gas pattern unremarkable. No edema or consolidation. No free air. Electronically Signed   By: Lowella Grip III M.D.   On: 10/30/2015 12:36     I have personally reviewed and evaluated these images and lab results as part of my medical decision-making.   EKG Interpretation None  MDM   Final diagnoses:  Right lower quadrant abdominal pain    Patient with continued and worse right lower quadrant pain. She has had 2 negative CT scans in the last 1 week and had an ultrasound that showed a benign cyst. Suspicion is low that she now has torsion. Workup is unremarkable here with normal WBC, no fevers, and no active vomiting after several hours in the emergency department. Her pain is significant but she also did not pick up any of the medicines from discharge. She states she could not afford these. She did have some sort of enteritis or acute GI issue, thus I will give her a short course of pain medicine and antibiotics. Encourage follow-up with PCP listed on discharge summary. Discussed return precautions.    Sherwood Gambler,  MD 10/30/15 [phone]

## 2015-11-01 ENCOUNTER — Encounter (HOSPITAL_COMMUNITY): Payer: Self-pay | Admitting: Emergency Medicine

## 2015-11-01 ENCOUNTER — Emergency Department (HOSPITAL_COMMUNITY)
Admission: EM | Admit: 2015-11-01 | Discharge: 2015-11-01 | Disposition: A | Discharge status: Home / Self Care | Payer: Medicaid Other | Attending: Emergency Medicine | Admitting: Emergency Medicine

## 2015-11-01 DIAGNOSIS — R1031 Right lower quadrant pain: Secondary | ICD-10-CM | POA: Insufficient documentation

## 2015-11-01 DIAGNOSIS — Z87891 Personal history of nicotine dependence: Secondary | ICD-10-CM | POA: Insufficient documentation

## 2015-11-01 DIAGNOSIS — R300 Dysuria: Secondary | ICD-10-CM | POA: Insufficient documentation

## 2015-11-01 DIAGNOSIS — Z3202 Encounter for pregnancy test, result negative: Secondary | ICD-10-CM | POA: Insufficient documentation

## 2015-11-01 DIAGNOSIS — Z8669 Personal history of other diseases of the nervous system and sense organs: Secondary | ICD-10-CM | POA: Insufficient documentation

## 2015-11-01 DIAGNOSIS — Z8659 Personal history of other mental and behavioral disorders: Secondary | ICD-10-CM | POA: Insufficient documentation

## 2015-11-01 DIAGNOSIS — R112 Nausea with vomiting, unspecified: Secondary | ICD-10-CM | POA: Insufficient documentation

## 2015-11-01 LAB — CBC WITH DIFFERENTIAL/PLATELET
BASOS ABS: 0 10*3/uL (ref 0.0–0.1)
Basophils Relative: 0 %
Eosinophils Absolute: 0.1 10*3/uL (ref 0.0–0.7)
Eosinophils Relative: 2 %
HEMATOCRIT: 34.1 % — AB (ref 36.0–46.0)
Hemoglobin: 11.3 g/dL — ABNORMAL LOW (ref 12.0–15.0)
LYMPHS ABS: 1.4 10*3/uL (ref 0.7–4.0)
LYMPHS PCT: 21 %
MCH: 29.2 pg (ref 26.0–34.0)
MCHC: 33.1 g/dL (ref 30.0–36.0)
MCV: 88.1 fL (ref 78.0–100.0)
MONO ABS: 0.5 10*3/uL (ref 0.1–1.0)
MONOS PCT: 7 %
NEUTROS ABS: 4.6 10*3/uL (ref 1.7–7.7)
Neutrophils Relative %: 70 %
Platelets: 417 10*3/uL — ABNORMAL HIGH (ref 150–400)
RBC: 3.87 MIL/uL (ref 3.87–5.11)
RDW: 15.1 % (ref 11.5–15.5)
WBC: 6.6 10*3/uL (ref 4.0–10.5)

## 2015-11-01 LAB — BASIC METABOLIC PANEL
ANION GAP: 8 (ref 5–15)
BUN: <5 mg/dL — ABNORMAL LOW (ref 6–20)
CALCIUM: 9.2 mg/dL (ref 8.9–10.3)
CO2: 25 mmol/L (ref 22–32)
Chloride: 106 mmol/L (ref 101–111)
Creatinine, Ser: 0.82 mg/dL (ref 0.44–1.00)
GFR calc Af Amer: >60 mL/min (ref >60)
GFR calc non Af Amer: >60 mL/min (ref >60)
GLUCOSE: 91 mg/dL (ref 65–99)
Potassium: 3.5 mmol/L (ref 3.5–5.1)
Sodium: 139 mmol/L (ref 135–145)

## 2015-11-01 LAB — URINALYSIS, ROUTINE W REFLEX MICROSCOPIC
Bilirubin Urine: NEGATIVE
GLUCOSE, UA: NEGATIVE mg/dL
Hgb urine dipstick: NEGATIVE
KETONES UR: 40 mg/dL — AB
LEUKOCYTES UA: NEGATIVE
Nitrite: NEGATIVE
PH: 6.5 (ref 5.0–8.0)
Protein, ur: NEGATIVE mg/dL
Specific Gravity, Urine: 1.021 (ref 1.005–1.030)

## 2015-11-01 LAB — WET PREP, GENITAL
SPERM: NONE SEEN
TRICH WET PREP: NONE SEEN
Yeast Wet Prep HPF POC: NONE SEEN

## 2015-11-01 LAB — POC URINE PREG, ED: Preg Test, Ur: NEGATIVE

## 2015-11-01 MED ORDER — KETOROLAC TROMETHAMINE 30 MG/ML IJ SOLN
30.0000 mg | Freq: Once | INTRAMUSCULAR | Status: AC
  Administered 2015-11-01: 30 mg via INTRAVENOUS
  Filled 2015-11-01: qty 1

## 2015-11-01 MED ORDER — AMOXICILLIN-POT CLAVULANATE 875-125 MG PO TABS
1.0000 | ORAL_TABLET | Freq: Once | ORAL | Status: AC
  Administered 2015-11-01: 1 via ORAL
  Filled 2015-11-01: qty 1

## 2015-11-01 MED ORDER — KETOROLAC TROMETHAMINE 30 MG/ML IJ SOLN: 30.0000 mg | Freq: Once | INTRAMUSCULAR | Status: DC

## 2015-11-01 NOTE — Care Management Note (Signed)
Case Management Note  Patient Details  Name: Maria Sampson MRN: [id number] Date of Birth: 1991/01/09  Subjective/Objective:   [facility] consulted for medication assistance and pcp follow up                 Action/Plan: Patient given [place] letter, referred to [facility], referred to [facility] for orange card enrollment   Expected Discharge Date:  11/01/2015                Expected Discharge Plan:  Home/Self Care  In-House Referral:     Discharge planning Services  Medication Assistance, [place] Program, CM Consult  Post Acute Care Choice:    Choice offered to:     DME Arranged:    DME Agency:     HH Arranged:    [place] Agency:     Status of Service:  Completed, signed off  Medicare Important Message Given:    Date Medicare IM Given:    Medicare IM give by:    Date Additional Medicare IM Given:    Additional Medicare Important Message give by:     If discussed at [place] of Stay Meetings, dates discussed:    Additional Comments: EDCM explained to patient [facility] is a one time a year medication assist, and that she has seven days to fill her prescriptions otherwise, [place] letter will expire.  Patient agreeable to referral to [facility] and [facility] for orange card.  Patient with Medicaid [place] Access.  No further EDCM needs at this time.  Discussed with EDP.  Christen Bame, Jonah Nestle, RN 11/01/2015, 5:17 PM

## 2015-11-01 NOTE — ED Provider Notes (Signed)
CSN: [id number]     Arrival date & time 11/01/15  1409 History   First MD Initiated Contact with Patient 11/01/15 1547     Chief Complaint  Patient presents with  . Abdominal Pain  . Nausea  . Emesis  . Recently Hospitalized      (Consider location/radiation/quality/duration/timing/severity/associated sxs/prior Treatment) Patient is a 24 y.o. female presenting with abdominal pain and vomiting.  Abdominal Pain Associated symptoms: dysuria, nausea and vomiting   Emesis Associated symptoms: abdominal pain    planes right lower quadrant pain onset 10/23/2015. Constant, nonradiating. Pain is worse with movement and improved with "crawling up into a ball. Maximum temperature as been 100.3. She was admitted 10/23/2015. Discharged 10/29/2015 diagnosed with colitis, ovarian cyst. She return to the emergency department 10/30/2015 discharged with prescription for Percocet and Phenergan. She's taking without relief. She currently admits to nausea. No vomiting. No other associated symptoms. Patient denies vaginal discharge. Admits to dysuria. Patient had CT scans of the abdomen pelvis on 10/23/2015 and 10/28/2015 also had ultrasound of the pelvis 10 2016. She did not fill her prescriptions for Augmentin, famotidine, Keeter Roloff, MiraLAX andFlorastar, due to lack of funds which did fill prescriptions for Phenergan and Percocet prescribed 10/30/2015 Past Medical History  Diagnosis Date  . Otitis externa due to Pseudomonas aeruginosa 04/2014  . Depression    History reviewed. No pertinent past surgical history. Family History  Problem Relation Age of Onset  . Crohn's disease Other    Social History  Substance Use Topics  . Smoking status: Former Smoker    Types: Cigarettes    Quit date: 03/03/2014  . Smokeless tobacco: Never Used  . Alcohol Use: Yes     Comment: social   OB History    No data available     Review of Systems  Constitutional: Negative.   HENT: Negative.   Respiratory:  Negative.   Cardiovascular: Negative.   Gastrointestinal: Positive for nausea, vomiting and abdominal pain.  Genitourinary: Positive for dysuria.  Musculoskeletal: Negative.   Skin: Negative.   Neurological: Negative.   Psychiatric/Behavioral: Negative.   All other systems reviewed and are negative.     Allergies  Peanut-containing drug products and Food  Home Medications   Prior to Admission medications   Medication Sig Start Date End Date Taking? Authorizing Provider  acetaminophen (TYLENOL) 500 MG tablet Take 1,000 mg by mouth every 6 (six) hours as needed for mild pain, moderate pain, fever or headache.    Historical Provider, MD  amoxicillin-clavulanate (AUGMENTIN) 875-125 MG tablet Take 1 tablet by mouth 2 (two) times daily. Patient not taking: Reported on 10/30/2015 10/29/15   Donne Hazel, MD  famotidine (PEPCID) 40 MG tablet Take 1 tablet (40 mg total) by mouth daily. Patient not taking: Reported on 10/30/2015 10/29/15   Donne Hazel, MD  ketorolac (TORADOL) 10 MG tablet Take 1 tablet (10 mg total) by mouth every 6 (six) hours as needed. Patient not taking: Reported on 10/30/2015 10/29/15   Donne Hazel, MD  oxyCODONE-acetaminophen (PERCOCET) 5-325 MG tablet Take 1-2 tablets by mouth every 4 (four) hours as needed for severe pain. 10/30/15   Sherwood Gambler, MD  polyethylene glycol [facility]) packet Take 17 g by mouth daily. Patient not taking: Reported on 05/24/2015 05/10/15   Charlann Lange, PA-C  promethazine (PHENERGAN) 25 MG tablet Take 1 tablet (25 mg total) by mouth every 6 (six) hours as needed for nausea or vomiting. 10/30/15   Sherwood Gambler, MD  saccharomyces boulardii [facility])  250 MG capsule Take 1 capsule (250 mg total) by mouth 2 (two) times daily. Patient not taking: Reported on 10/30/2015 10/29/15   Donne Hazel, MD   BP 145/91 mmHg  Pulse 72  Temp(Src) 98.1 F (36.7 C) (Oral)  Resp 16  SpO2 100%  LMP 10/16/2015 (Approximate) Physical Exam   Constitutional: She appears well-developed and well-nourished.  HENT:  Head: Normocephalic and atraumatic.  Eyes: Conjunctivae are normal. Pupils are equal, round, and reactive to light.  Neck: Neck supple. No tracheal deviation present. No thyromegaly present.  Cardiovascular: Normal rate and regular rhythm.   No murmur heard. Pulmonary/Chest: Effort normal and breath sounds normal.  Abdominal: Soft. Bowel sounds are normal. She exhibits no distension. There is tenderness.  Tender right lower quadrant  Genitourinary:  Normal external genitalia. Slight amount of white vaginal discharge. No cervical motion tenderness cervical os closed. No adnexal masses or tenderness  Musculoskeletal: Normal range of motion. She exhibits no edema or tenderness.  Neurological: She is alert. Coordination normal.  Skin: Skin is warm and dry. No rash noted.  Psychiatric: She has a normal mood and affect.  Nursing note and vitals reviewed.   ED Course  Procedures (including critical care time) Labs Review Labs Reviewed - No data to display  Imaging Review No results found. I have personally reviewed and evaluated these images and lab results as part of my medical decision-making.   EKG Interpretation None     6:05 PM pain is improved after treatment with intravenous Toradol. She feels ready to go home. Results for orders placed or performed during the hospital encounter of 11/01/15  Wet prep, genital  Result Value Ref Range   Yeast Wet Prep HPF POC NONE SEEN NONE SEEN   Trich, Wet Prep NONE SEEN NONE SEEN   Clue Cells Wet Prep HPF POC PRESENT (A) NONE SEEN   WBC, Wet Prep HPF POC MANY (A) NONE SEEN   Sperm NONE SEEN   CBC with Differential/Platelet  Result Value Ref Range   WBC 6.6 4.0 - 10.5 K/uL   RBC 3.87 3.87 - 5.11 MIL/uL   Hemoglobin 11.3 (L) 12.0 - 15.0 g/dL   HCT 34.1 (L) 36.0 - 46.0 %   MCV 88.1 78.0 - 100.0 fL   MCH 29.2 26.0 - 34.0 pg   MCHC 33.1 30.0 - 36.0 g/dL   RDW 15.1  11.5 - 15.5 %   Platelets 417 (H) 150 - 400 K/uL   Neutrophils Relative % 70 %   Neutro Abs 4.6 1.7 - 7.7 K/uL   Lymphocytes Relative 21 %   Lymphs Abs 1.4 0.7 - 4.0 K/uL   Monocytes Relative 7 %   Monocytes Absolute 0.5 0.1 - 1.0 K/uL   Eosinophils Relative 2 %   Eosinophils Absolute 0.1 0.0 - 0.7 K/uL   Basophils Relative 0 %   Basophils Absolute 0.0 0.0 - 0.1 K/uL  Basic metabolic panel  Result Value Ref Range   Sodium 139 135 - 145 mmol/L   Potassium 3.5 3.5 - 5.1 mmol/L   Chloride 106 101 - 111 mmol/L   CO2 25 22 - 32 mmol/L   Glucose, Bld 91 65 - 99 mg/dL   BUN <5 (L) 6 - 20 mg/dL   Creatinine, Ser 0.82 0.44 - 1.00 mg/dL   Calcium 9.2 8.9 - 10.3 mg/dL   GFR calc non Af Amer >60 >60 mL/min   GFR calc Af Amer >60 >60 mL/min   Anion gap 8 5 -  15  Urinalysis, Routine w reflex microscopic (not at [facility])  Result Value Ref Range   Color, Urine YELLOW YELLOW   APPearance CLOUDY (A) CLEAR   Specific Gravity, Urine 1.021 1.005 - 1.030   pH 6.5 5.0 - 8.0   Glucose, UA NEGATIVE NEGATIVE mg/dL   Hgb urine dipstick NEGATIVE NEGATIVE   Bilirubin Urine NEGATIVE NEGATIVE   Ketones, ur 40 (A) NEGATIVE mg/dL   Protein, ur NEGATIVE NEGATIVE mg/dL   Nitrite NEGATIVE NEGATIVE   Leukocytes, UA NEGATIVE NEGATIVE  POC urine preg, ED (not at [facility])  Result Value Ref Range   Preg Test, Ur NEGATIVE NEGATIVE   US Transvaginal Non-ob  10/29/2015  CLINICAL DATA:  RIGHT ovarian cyst.  RIGHT pelvic pain for 7 days. EXAM: TRANSABDOMINAL AND TRANSVAGINAL ULTRASOUND OF PELVIS TECHNIQUE: Both transabdominal and transvaginal ultrasound examinations of the pelvis were performed. Transabdominal technique was performed for global imaging of the pelvis including uterus, ovaries, adnexal regions, and pelvic cul-de-sac. It was necessary to proceed with endovaginal exam following the transabdominal exam to visualize the endometrium and ovaries. COMPARISON:  CT 10/28/2015 FINDINGS: Uterus Measurements: Normal in  size and echogenicity measuring 10.6 x 4.6 x 6 point cm. Uterus is retroverted. Two small hypoechoic regions in the uterine fundus measuring 1 cm and 3 cm consistent with leiomyomas. Endometrium Thickness: Normal in thickness for premenopausal female at 13 mm. No focal abnormality visualized. Right ovary Measurements: Normal in size at 5.8 x 3.7 x 4.3 cm. There is a anechoic cyst measuring 3.1 cm x 2.4 x 2.9 cm. No abnormal blood flow or mural nodularity. Left ovary Measurements: Normal in size and echogenicity with small follicles measuring 3.5 x 1.5 x 2.3 cm. Normal appearance/no adnexal mass. Other findings No free fluid. IMPRESSION: 1. Cystic lesion of the RIGHT ovary with benign features is most consistent with a functional ovarian cyst. No specific follow-up recommended in this age group for cysts of this size. This recommendation follows the consensus statement: Management of Asymptomatic Ovarian and Other Adnexal Cysts Imaged at [place]: Society of Radiologists in [place]. Radiology 2010; [phone]. 2. Normal LEFT ovary. 3. Retroverted uterus. 4. Two leiomyoma within the uterine body and fundus. Electronically Signed   By: Suzy Bouchard M.D.   On: 10/29/2015 09:47   US Pelvis Complete  10/29/2015  CLINICAL DATA:  RIGHT ovarian cyst.  RIGHT pelvic pain for 7 days. EXAM: TRANSABDOMINAL AND TRANSVAGINAL ULTRASOUND OF PELVIS TECHNIQUE: Both transabdominal and transvaginal ultrasound examinations of the pelvis were performed. Transabdominal technique was performed for global imaging of the pelvis including uterus, ovaries, adnexal regions, and pelvic cul-de-sac. It was necessary to proceed with endovaginal exam following the transabdominal exam to visualize the endometrium and ovaries. COMPARISON:  CT 10/28/2015 FINDINGS: Uterus Measurements: Normal in size and echogenicity measuring 10.6 x 4.6 x 6 point cm. Uterus is retroverted. Two small hypoechoic regions in the uterine  fundus measuring 1 cm and 3 cm consistent with leiomyomas. Endometrium Thickness: Normal in thickness for premenopausal female at 13 mm. No focal abnormality visualized. Right ovary Measurements: Normal in size at 5.8 x 3.7 x 4.3 cm. There is a anechoic cyst measuring 3.1 cm x 2.4 x 2.9 cm. No abnormal blood flow or mural nodularity. Left ovary Measurements: Normal in size and echogenicity with small follicles measuring 3.5 x 1.5 x 2.3 cm. Normal appearance/no adnexal mass. Other findings No free fluid. IMPRESSION: 1. Cystic lesion of the RIGHT ovary with benign features is most consistent with a functional  ovarian cyst. No specific follow-up recommended in this age group for cysts of this size. This recommendation follows the consensus statement: Management of Asymptomatic Ovarian and Other Adnexal Cysts Imaged at [place]: Society of Radiologists in [place]. Radiology 2010; [phone]. 2. Normal LEFT ovary. 3. Retroverted uterus. 4. Two leiomyoma within the uterine body and fundus. Electronically Signed   By: Suzy Bouchard M.D.   On: 10/29/2015 09:47   Ct Abdomen Pelvis W Contrast  10/28/2015  CLINICAL DATA:  Nausea, vomiting, and abdominal pain for the past 2 days with fever and chills. EXAM: CT ABDOMEN AND PELVIS WITH CONTRAST TECHNIQUE: Multidetector CT imaging of the abdomen and pelvis was performed using the standard protocol following bolus administration of intravenous contrast. CONTRAST:  111mL OMNIPAQUE IOHEXOL 300 MG/ML  SOLN COMPARISON:  10/23/2015 FINDINGS: Lower chest: Moderate right and small left pleural effusions with passive atelectasis, new from the last 5 days. Hepatobiliary: Unremarkable Pancreas: Unremarkable Spleen: Unremarkable Adrenals/Urinary Tract: Unremarkable Stomach/Bowel: Orally administered contrast extends through to the rectum. Appendix unremarkable. No dilated bowel. Vascular/Lymphatic: 1 cm left external inguinal lymph node, image 82 series 2.  Previously this measured 8 mm in diameter. Reproductive: Retroverted uterus, endometrial thickness approximately 1.3 cm which is within normal limits for age. The cyst or corpus luteum in the right ovary, 2.5 cm diameter. Other: Diffuse low-level mesenteric and omental edema. Trace free pelvic fluid. Subcutaneous edema along the abdomen in a bilaterally symmetric manner. Musculoskeletal: Unremarkable IMPRESSION: 1. Borderline dilated loop of left upper quadrant small bowel with normal fold pattern and without fold thickening and without an obvious cause for dilatation. Orally administered contrast extends through to the rectum. No specific inflammation of the terminal ileum, cecum, or appendix. There is a new 2.5 cm in diameter cystic lesion in the right ovary, potentially a corpus luteum, which could be further investigated with pelvic sonography if clinically warranted. No compelling findings for Meckel's diverticulum. 2. There is new third spacing of fluid with subcutaneous and mesenteric edema, trees trace free pelvic fluid, and a moderate right and small left pleural effusion. Electronically Signed   By: Van Clines M.D.   On: 10/28/2015 18:23   Ct Abdomen Pelvis W Contrast  10/24/2015  ADDENDUM REPORT: 10/24/2015 10:15 ADDENDUM: I reviewed this case with Dr. Jackolyn Confer on 10/24/2015 with respect to the appendix. The appendix is visualized, best seen on the coronal images and appears normal. In addition to the mild small bowel distension described, there is possible diffuse colonic wall thickening, although the colon is relatively decompressed. This could reflect a mild colitis in this patient with diarrhea. Electronically Signed   By: Richardean Sale M.D.   On: 10/24/2015 10:15  10/24/2015  CLINICAL DATA:  Right lower quadrant abdominal pain, nausea and vomiting for the past 2 days. Irregular vaginal bleeding this past week. EXAM: CT ABDOMEN AND PELVIS WITH CONTRAST TECHNIQUE: Multidetector CT  imaging of the abdomen and pelvis was performed using the standard protocol following bolus administration of intravenous contrast. CONTRAST:  152mL OMNIPAQUE IOHEXOL 300 MG/ML  SOLN COMPARISON:  Previous examinations, the most recent dated 05/10/2015. FINDINGS: Lower chest: 4 mm subpleural nodule in the left lower lobe on image 6. This unchanged since 05/01/2011, compatible with a benign nodule. Hepatobiliary: Again demonstrated is a gallbladder Phrygian cap. This contains lower density fluid than the remainder of the gallbladder. Normal appearing liver. Pancreas: No mass, inflammatory changes, or other significant abnormality. Spleen: Within normal limits in size and appearance. Adrenals/Urinary Tract: No  masses identified. No evidence of hydronephrosis. Stomach/Bowel: Multiple dilated loops of jejunum in the mid abdomen. One of these demonstrates diffuse wall thickening on image number 45 of series 2. Normal caliber distal small bowel and colon. No gastric dilatation. No obstructing mass or hernia seen. Vascular/Lymphatic: No pathologically enlarged lymph nodes. No evidence of abdominal aortic aneurysm. Reproductive: Retroflexed uterus with the previously noted anterior uterine mass. The mass currently measures 3.1 x 2.6 cm on sagittal image number 67. Other: Small amount of free peritoneal fluid in the pelvic cul-de-sac, within normal limits of physiological fluid. Musculoskeletal:  Normal appearing bones. IMPRESSION: 1. Mild ileus or partial obstruction involving loops of jejunum in the mid abdomen. One of these demonstrates mild diffuse wall thickening, possibly due to infectious or inflammatory enteritis. 2. Probable sludge in the gallbladder, sparing the fluid in the Phrygian cap. 3. 3.1 x 2.6 cm uterine fibroid. Electronically Signed: By: Claudie Revering M.D. On: 10/23/2015 19:45   Dg Abd Acute W/chest  10/30/2015  CLINICAL DATA:  One week history of lower abdominal pain.  Diarrhea. EXAM: DG ABDOMEN ACUTE  W/ 1V CHEST COMPARISON:  Chest radiograph November 08, 2013; CT abdomen and pelvis October 28, 2015 FINDINGS: PA chest: There is no edema or consolidation. The heart size and pulmonary vascularity are normal. No adenopathy. Supine and upright abdomen: There is no bowel dilatation or air-fluid level suggesting obstruction. No free air. There are small phleboliths in the pelvis. IMPRESSION: Bowel gas pattern unremarkable. No edema or consolidation. No free air. Electronically Signed   By: Lowella Grip III M.D.   On: 10/30/2015 12:36    MDM  Case manager wasConsulted and provided match letter to patient since she did get each of her prescriptions filled for $3. The [place] [facility] will call her within 2 days for close outpatient follow-up Abdominal pain is subacute lasting 1.5 weeks. Some swelling doubt appendicitis with 2 negative recent CT scans. She is nontender on pelvic exam  Final diagnoses:  None  Diagnosis abdominal pain      Orlie Dakin, MD 11/01/15 1821

## 2015-11-01 NOTE — ED Notes (Signed)
Pt was recently hospitalized for small bowel infection in ileum and intestinal swelling. Was discharged 10/29/15 but says the pain is increasing in the RLQ and now is radiating to umbilicus. Takes nausea medication prescribed to her with no alleviation. Decreased PO intake. No other c/c.

## 2015-11-01 NOTE — Discharge Instructions (Signed)
Abdominal Pain, Adult The [place] [facility] will call you within 2 days to schedule follow-up appointment. You can get all of your medications prescribed at your last hospital discharge for $3 each, with the match letter provided to you by our case manager. If you don't hear from [place] in 48 hours, call to schedule appointment. Tell office staff that he was seen here when scheduling the appointment. Many things can cause belly (abdominal) pain. Most times, the belly pain is not dangerous. Many cases of belly pain can be watched and treated at home. HOME CARE   Do not take medicines that help you go poop (laxatives) unless told to by your doctor.  Only take medicine as told by your doctor.  Eat or drink as told by your doctor. Your doctor will tell you if you should be on a special diet. GET HELP IF:  You do not know what is causing your belly pain.  You have belly pain while you are sick to your stomach (nauseous) or have runny poop (diarrhea).  You have pain while you pee or poop.  Your belly pain wakes you up at night.  You have belly pain that gets worse or better when you eat.  You have belly pain that gets worse when you eat fatty foods.  You have a fever. GET HELP RIGHT AWAY IF:   The pain does not go away within 2 hours.  You keep throwing up (vomiting).  The pain changes and is only in the right or left part of the belly.  You have bloody or tarry looking poop. MAKE SURE YOU:   Understand these instructions.  Will watch your condition.  Will get help right away if you are not doing well or get worse.   This information is not intended to replace advice given to you by your health care provider. Make sure you discuss any questions you have with your health care provider.   Document Released: 04/23/2008 Document Revised: 11/26/2014 Document Reviewed: 07/15/2013 Elsevier Interactive Patient Education [employer].

## 2015-11-02 ENCOUNTER — Telehealth: Payer: Self-pay

## 2015-11-02 LAB — HIV ANTIBODY (ROUTINE TESTING W REFLEX): HIV Screen 4th Generation wRfx: NONREACTIVE

## 2015-11-02 LAB — GC/CHLAMYDIA PROBE AMP (~~LOC~~) NOT AT ARMC
CHLAMYDIA, DNA PROBE: NEGATIVE
NEISSERIA GONORRHEA: NEGATIVE

## 2015-11-02 LAB — RPR: RPR Ser Ql: NONREACTIVE

## 2015-11-02 NOTE — Telephone Encounter (Signed)
Request received from Livia Snellen, RN CM requesting a follow up appointment for the patient.  She also needs to establish care with a PCP.   Call placed to the patient and she said that she is " feeling a little better."  She also noted that she has all of her medication. An appointment was scheduled for her at the [facility] on 11/15/15 @ 1100.  She was very appreciative of the appointment.  An update was provided to A. Christen Bame, RN CM.

## 2015-11-15 ENCOUNTER — Ambulatory Visit: Payer: Self-pay | Admitting: Family Medicine

## 2016-02-13 DIAGNOSIS — H5712 Ocular pain, left eye: Secondary | ICD-10-CM | POA: Insufficient documentation

## 2016-02-13 DIAGNOSIS — H02846 Edema of left eye, unspecified eyelid: Secondary | ICD-10-CM | POA: Insufficient documentation

## 2016-02-14 ENCOUNTER — Emergency Department (HOSPITAL_COMMUNITY)
Admission: EM | Admit: 2016-02-14 | Discharge: 2016-02-14 | Disposition: A | Discharge status: Home / Self Care | Payer: Medicaid Other | Attending: Emergency Medicine | Admitting: Emergency Medicine

## 2016-02-14 ENCOUNTER — Ambulatory Visit (HOSPITAL_COMMUNITY): Admission: RE | Admit: 2016-02-14 | Payer: Medicaid Other | Source: Ambulatory Visit

## 2016-02-14 ENCOUNTER — Encounter (HOSPITAL_COMMUNITY): Payer: Self-pay | Admitting: Emergency Medicine

## 2016-02-14 LAB — I-STAT CHEM 8, ED
BUN: 9 mg/dL (ref 6–20)
Calcium, Ion: 1.18 mmol/L (ref 1.12–1.23)
Chloride: 101 mmol/L (ref 101–111)
Creatinine, Ser: 0.7 mg/dL (ref 0.44–1.00)
Glucose, Bld: 78 mg/dL (ref 65–99)
HEMATOCRIT: 40 % (ref 36.0–46.0)
HEMOGLOBIN: 13.6 g/dL (ref 12.0–15.0)
POTASSIUM: 3.5 mmol/L (ref 3.5–5.1)
Sodium: 140 mmol/L (ref 135–145)
TCO2: 26 mmol/L (ref 0–100)

## 2016-02-14 LAB — CBC WITH DIFFERENTIAL/PLATELET
BASOS PCT: 0 %
Basophils Absolute: 0 10*3/uL (ref 0.0–0.1)
EOS ABS: 0.2 10*3/uL (ref 0.0–0.7)
Eosinophils Relative: 3 %
HEMATOCRIT: 34.9 % — AB (ref 36.0–46.0)
Hemoglobin: 11.8 g/dL — ABNORMAL LOW (ref 12.0–15.0)
Lymphocytes Relative: 32 %
Lymphs Abs: 2.5 10*3/uL (ref 0.7–4.0)
MCH: 29.6 pg (ref 26.0–34.0)
MCHC: 33.8 g/dL (ref 30.0–36.0)
MCV: 87.5 fL (ref 78.0–100.0)
MONO ABS: 0.5 10*3/uL (ref 0.1–1.0)
MONOS PCT: 6 %
Neutro Abs: 4.8 10*3/uL (ref 1.7–7.7)
Neutrophils Relative %: 59 %
PLATELETS: 290 10*3/uL (ref 150–400)
RBC: 3.99 MIL/uL (ref 3.87–5.11)
RDW: 14.1 % (ref 11.5–15.5)
WBC: 8.1 10*3/uL (ref 4.0–10.5)

## 2016-02-14 NOTE — ED Provider Notes (Signed)
CSN: [id number]     Arrival date & time 02/13/16  2342 History   First MD Initiated Contact with Patient 02/14/16 0301     Chief Complaint  Patient presents with  . Eye Problem     (Consider location/radiation/quality/duration/timing/severity/associated sxs/prior Treatment) HPI Comments: This normally healthy 25 year old female who presents to the emergency room tonight with complaints of left eye pain for the past several days.  She states it initially started it .  The lateral aspect of the left eye thinks that they may have been some swelling and irritation to the upper lid.  She denies any URI symptoms, congestion, drainage scale and injection, but she does endorse blurry vision and pain with eye movement, particularly to the left.  Patient is a 25 y.o. female presenting with eye problem. The history is provided by the patient.  Eye Problem Location:  L eye Quality:  Aching Severity:  Moderate Onset quality:  Gradual Timing:  Constant Progression:  Worsening Chronicity:  New Context: not burn, not contact lens problem, not direct trauma, not using machinery and not smoke exposure   Relieved by:  Nothing Worsened by:  Eye movement Ineffective treatments:  None tried Associated symptoms: no discharge, no headaches, no itching and no redness     Past Medical History  Diagnosis Date  . Otitis externa due to Pseudomonas aeruginosa 04/2014  . Depression    History reviewed. No pertinent past surgical history. Family History  Problem Relation Age of Onset  . Crohn's disease Other    Social History  Substance Use Topics  . Smoking status: Former Smoker    Types: Cigarettes    Quit date: 03/03/2014  . Smokeless tobacco: Never Used  . Alcohol Use: Yes     Comment: social   OB History    No data available     Review of Systems  Constitutional: Negative for fever and chills.  HENT: Negative for rhinorrhea.   Eyes: Positive for pain and visual disturbance. Negative for  discharge, redness and itching.  Skin: Negative for wound.  Neurological: Negative for headaches.  All other systems reviewed and are negative.     Allergies  Peanut-containing drug products and Food  Home Medications   Prior to Admission medications   Medication Sig Start Date End Date Taking? Authorizing Provider  acetaminophen (TYLENOL) 500 MG tablet Take 1,000 mg by mouth every 6 (six) hours as needed for mild pain, moderate pain, fever or headache.   Yes Historical Provider, MD  amoxicillin-clavulanate (AUGMENTIN) 875-125 MG tablet Take 1 tablet by mouth 2 (two) times daily. Patient not taking: Reported on 10/30/2015 10/29/15   Donne Hazel, MD  famotidine (PEPCID) 40 MG tablet Take 1 tablet (40 mg total) by mouth daily. Patient not taking: Reported on 10/30/2015 10/29/15   Donne Hazel, MD  ketorolac (TORADOL) 10 MG tablet Take 1 tablet (10 mg total) by mouth every 6 (six) hours as needed. Patient not taking: Reported on 10/30/2015 10/29/15   Donne Hazel, MD  oxyCODONE-acetaminophen (PERCOCET) 5-325 MG tablet Take 1-2 tablets by mouth every 4 (four) hours as needed for severe pain. Patient not taking: Reported on 02/14/2016 10/30/15   Sherwood Gambler, MD  polyethylene glycol [facility]) packet Take 17 g by mouth daily. Patient not taking: Reported on 05/24/2015 05/10/15   Charlann Lange, PA-C  promethazine (PHENERGAN) 25 MG tablet Take 1 tablet (25 mg total) by mouth every 6 (six) hours as needed for nausea or vomiting. Patient not taking: Reported  on 02/14/2016 10/30/15   Sherwood Gambler, MD  saccharomyces boulardii (FLORASTOR) 250 MG capsule Take 1 capsule (250 mg total) by mouth 2 (two) times daily. Patient not taking: Reported on 10/30/2015 10/29/15   Donne Hazel, MD   BP 141/93 mmHg  Pulse 74  Temp(Src) 98.4 F (36.9 C) (Oral)  Resp 20  Ht 5\' 11"  (1.803 m)  Wt 83.915 kg  BMI 25.81 kg/m2  SpO2 100%  LMP 01/31/2016 Physical Exam  Constitutional: She appears  well-developed and well-nourished.  HENT:  Head: Normocephalic.  Eyes: Pupils are equal, round, and reactive to light. Left eye exhibits no chemosis, no discharge, no exudate and no hordeolum. No foreign body present in the left eye. Left conjunctiva is not injected. Left conjunctiva has no hemorrhage. No scleral icterus. Left eye exhibits abnormal extraocular motion. Left eye exhibits no nystagmus.  Neck: Normal range of motion.  Cardiovascular: Normal rate.   Pulmonary/Chest: Effort normal.  Musculoskeletal: Normal range of motion.  Neurological: She is alert.  Skin: Skin is warm.  Nursing note and vitals reviewed.   ED Course  Procedures (including critical care time) Labs Review Labs Reviewed  CBC WITH DIFFERENTIAL/PLATELET - Abnormal; Notable for the following:    Hemoglobin 11.8 (*)    HCT 34.9 (*)    All other components within normal limits  I-STAT CHEM 8, ED    Imaging Review No results found. I have personally reviewed and evaluated these images and lab results as part of my medical decision-making.   EKG Interpretation None     Surgeon for posterior ocular infection due to the physical examination, I do not see any external rash, swelling, redness.  She has pain from the lateral aspect of the left eye as well as pain with either movement, particularly to the left and upwards MDM   Final diagnoses:  None         Junius Creamer, NP 02/18/16 1952  Everlene Balls, MD 02/23/16 [phone]

## 2016-02-14 NOTE — ED Notes (Signed)
Patient c/o left eye pain for past few days. Reports it has gradually swelled for the past 2 days and now is very painful, 8/10. Patient also reports it is effecting her vision.

## 2016-02-14 NOTE — ED Notes (Signed)
Pt c/o pain and swelling to L eye, swelling appears to be on underside of eyelid. No drainage noted.

## 2016-03-21 ENCOUNTER — Emergency Department (HOSPITAL_COMMUNITY)
Admission: EM | Admit: 2016-03-21 | Discharge: 2016-03-21 | Disposition: A | Discharge status: Home / Self Care | Payer: Worker's Compensation | Attending: Emergency Medicine | Admitting: Emergency Medicine

## 2016-03-21 ENCOUNTER — Emergency Department (HOSPITAL_COMMUNITY): Payer: Worker's Compensation

## 2016-03-21 ENCOUNTER — Encounter (HOSPITAL_COMMUNITY): Payer: Self-pay | Admitting: Emergency Medicine

## 2016-03-21 DIAGNOSIS — Y990 Civilian activity done for income or pay: Secondary | ICD-10-CM | POA: Insufficient documentation

## 2016-03-21 DIAGNOSIS — Z87891 Personal history of nicotine dependence: Secondary | ICD-10-CM | POA: Diagnosis not present

## 2016-03-21 DIAGNOSIS — Y939 Activity, unspecified: Secondary | ICD-10-CM | POA: Insufficient documentation

## 2016-03-21 DIAGNOSIS — S9031XA Contusion of right foot, initial encounter: Secondary | ICD-10-CM | POA: Diagnosis not present

## 2016-03-21 DIAGNOSIS — Y9289 Other specified places as the place of occurrence of the external cause: Secondary | ICD-10-CM | POA: Insufficient documentation

## 2016-03-21 DIAGNOSIS — W208XXA Other cause of strike by thrown, projected or falling object, initial encounter: Secondary | ICD-10-CM | POA: Diagnosis not present

## 2016-03-21 DIAGNOSIS — F329 Major depressive disorder, single episode, unspecified: Secondary | ICD-10-CM | POA: Diagnosis not present

## 2016-03-21 DIAGNOSIS — Z79899 Other long term (current) drug therapy: Secondary | ICD-10-CM | POA: Diagnosis not present

## 2016-03-21 DIAGNOSIS — M79673 Pain in unspecified foot: Secondary | ICD-10-CM | POA: Diagnosis present

## 2016-03-21 MED ORDER — NAPROXEN 500 MG PO TABS
500.0000 mg | ORAL_TABLET | Freq: Once | ORAL | Status: AC
  Administered 2016-03-21: 500 mg via ORAL
  Filled 2016-03-21: qty 1

## 2016-03-21 MED ORDER — NAPROXEN 500 MG PO TABS: 500.0000 mg | ORAL_TABLET | Freq: Two times a day (BID) | ORAL | Status: DC

## 2016-03-21 NOTE — ED Notes (Signed)
Pt had to work Tuesday night and is angry that we cannot back date a work note.

## 2016-03-21 NOTE — ED Provider Notes (Signed)
CSN: [id number]     Arrival date & time 03/21/16  0554 History   First MD Initiated Contact with Patient 03/21/16 [phone]     Chief Complaint  Patient presents with  . Foot Pain    Patient is a 25 y.o. female presenting with lower extremity pain. The history is provided by the patient.  Foot Pain This is a new problem. Episode onset: Pt was at work and dropped a box on her foot.    The problem occurs constantly. The problem has not changed since onset.Pertinent negatives include no chest pain. The symptoms are aggravated by walking. Nothing relieves the symptoms. She has tried nothing for the symptoms. The treatment provided no relief.    Past Medical History  Diagnosis Date  . Otitis externa due to Pseudomonas aeruginosa 04/2014  . Depression    History reviewed. No pertinent past surgical history. Family History  Problem Relation Age of Onset  . Crohn's disease Other    Social History  Substance Use Topics  . Smoking status: Former Smoker    Types: Cigarettes    Quit date: 03/03/2014  . Smokeless tobacco: Never Used  . Alcohol Use: Yes     Comment: social   OB History    No data available     Review of Systems  Constitutional: Negative for fever.  HENT: Negative for sore throat.   Cardiovascular: Negative for chest pain.  All other systems reviewed and are negative.     Allergies  Peanut-containing drug products and Food  Home Medications   Prior to Admission medications   Medication Sig Start Date End Date Taking? Authorizing Provider  acetaminophen (TYLENOL) 500 MG tablet Take 1,000 mg by mouth every 6 (six) hours as needed for mild pain, moderate pain, fever or headache.    Historical Provider, MD   BP 136/90 mmHg  Pulse 86  Temp(Src) 98.3 F (36.8 C) (Oral)  Resp 20  SpO2 100% Physical Exam  Constitutional: She appears well-developed and well-nourished. No distress.  HENT:  Head: Normocephalic and atraumatic.  Right Ear: External ear normal.  Left Ear:  External ear normal.  Eyes: Conjunctivae are normal. Right eye exhibits no discharge. Left eye exhibits no discharge. No scleral icterus.  Neck: Neck supple. No tracheal deviation present.  Cardiovascular: Normal rate.   Pulmonary/Chest: Effort normal. No stridor. No respiratory distress.  Musculoskeletal: She exhibits no edema.       Right foot: There is tenderness and bony tenderness. There is no swelling and no deformity.       Feet:  Neurological: She is alert. Cranial nerve deficit: no gross deficits.  Skin: Skin is warm and dry. No rash noted.  Psychiatric: She has a normal mood and affect.  Nursing note and vitals reviewed.   ED Course  Procedures (including critical care time) Labs Review Labs Reviewed - No data to display  Imaging Review Dg Foot Complete Right  03/21/2016  CLINICAL DATA:  Patient dropped box on foot EXAM: RIGHT FOOT COMPLETE - 3+ VIEW COMPARISON:  None. FINDINGS: Frontal, oblique, and lateral views were obtained. There is no fracture or dislocation. Joint spaces appear normal. No erosive change. IMPRESSION: No fracture or dislocation.  No appreciable arthropathy. Electronically Signed   By: Lowella Grip III M.D.   On: 03/21/2016 07:38   I have personally reviewed and evaluated these images and lab results as part of my medical decision-making.   MDM   Final diagnoses:  Contusion, foot, right, initial encounter  Buddy tape toes.  Dc home with nsaids.  Follow up with [facility] health as needed    Dorie Rank, MD 03/21/16 [phone]

## 2016-03-21 NOTE — ED Notes (Signed)
Pt states she works at [employer] and a 80 lb box fell on her right foot on Friday  Pt states her great toe is painful

## 2016-03-21 NOTE — Discharge Instructions (Signed)

## 2016-05-21 ENCOUNTER — Emergency Department (HOSPITAL_COMMUNITY)
Admission: EM | Admit: 2016-05-21 | Discharge: 2016-05-21 | Disposition: A | Discharge status: Home / Self Care | Payer: Self-pay | Attending: Emergency Medicine | Admitting: Emergency Medicine

## 2016-05-21 ENCOUNTER — Emergency Department (HOSPITAL_COMMUNITY): Payer: Self-pay

## 2016-05-21 ENCOUNTER — Encounter (HOSPITAL_COMMUNITY): Payer: Self-pay | Admitting: Emergency Medicine

## 2016-05-21 DIAGNOSIS — Y9339 Activity, other involving climbing, rappelling and jumping off: Secondary | ICD-10-CM | POA: Insufficient documentation

## 2016-05-21 DIAGNOSIS — W228XXA Striking against or struck by other objects, initial encounter: Secondary | ICD-10-CM | POA: Insufficient documentation

## 2016-05-21 DIAGNOSIS — S32009A Unspecified fracture of unspecified lumbar vertebra, initial encounter for closed fracture: Secondary | ICD-10-CM | POA: Insufficient documentation

## 2016-05-21 DIAGNOSIS — S20212A Contusion of left front wall of thorax, initial encounter: Secondary | ICD-10-CM | POA: Insufficient documentation

## 2016-05-21 DIAGNOSIS — F329 Major depressive disorder, single episode, unspecified: Secondary | ICD-10-CM | POA: Insufficient documentation

## 2016-05-21 DIAGNOSIS — Y929 Unspecified place or not applicable: Secondary | ICD-10-CM | POA: Insufficient documentation

## 2016-05-21 DIAGNOSIS — Y999 Unspecified external cause status: Secondary | ICD-10-CM | POA: Insufficient documentation

## 2016-05-21 DIAGNOSIS — Z87891 Personal history of nicotine dependence: Secondary | ICD-10-CM | POA: Insufficient documentation

## 2016-05-21 DIAGNOSIS — T1490XA Injury, unspecified, initial encounter: Secondary | ICD-10-CM

## 2016-05-21 LAB — URINALYSIS, ROUTINE W REFLEX MICROSCOPIC
Glucose, UA: NEGATIVE mg/dL
Hgb urine dipstick: NEGATIVE
Ketones, ur: NEGATIVE mg/dL
NITRITE: NEGATIVE
Protein, ur: NEGATIVE mg/dL
Specific Gravity, Urine: 1.029 (ref 1.005–1.030)
pH: 5.5 (ref 5.0–8.0)

## 2016-05-21 LAB — URINE MICROSCOPIC-ADD ON

## 2016-05-21 LAB — PREGNANCY, URINE: Preg Test, Ur: NEGATIVE

## 2016-05-21 MED ORDER — ACETAMINOPHEN 325 MG PO TABS
650.0000 mg | ORAL_TABLET | Freq: Once | ORAL | Status: AC
  Administered 2016-05-21: 650 mg via ORAL
  Filled 2016-05-21: qty 2

## 2016-05-21 MED ORDER — HYDROCODONE-ACETAMINOPHEN 5-325 MG PO TABS
1.0000 | ORAL_TABLET | Freq: Once | ORAL | Status: AC
  Administered 2016-05-21: 1 via ORAL
  Filled 2016-05-21: qty 1

## 2016-05-21 MED ORDER — METHOCARBAMOL 500 MG PO TABS: 500.0000 mg | ORAL_TABLET | Freq: Two times a day (BID) | ORAL | Status: DC | PRN

## 2016-05-21 MED ORDER — HYDROCODONE-ACETAMINOPHEN 5-325 MG PO TABS: ORAL_TABLET | ORAL | Status: DC

## 2016-05-21 MED ORDER — OXYCODONE-ACETAMINOPHEN 5-325 MG PO TABS: 1.0000 | ORAL_TABLET | ORAL | Status: DC | PRN

## 2016-05-21 MED ORDER — METHOCARBAMOL 500 MG PO TABS
1000.0000 mg | ORAL_TABLET | Freq: Once | ORAL | Status: AC
  Administered 2016-05-21: 1000 mg via ORAL
  Filled 2016-05-21: qty 2

## 2016-05-21 MED ORDER — MORPHINE SULFATE (PF) 4 MG/ML IV SOLN
6.0000 mg | Freq: Once | INTRAVENOUS | Status: AC
  Administered 2016-05-21: 6 mg via INTRAMUSCULAR
  Filled 2016-05-21: qty 2
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
## 2016-05-21 NOTE — Discharge Instructions (Signed)
Take percocet for breakthrough pain, do not drink alcohol, drive, care for children or do other critical tasks while taking percocet.   It is very important that you take deep breaths to prevent lung collapse and infection.  Either use your incentive spirometer or take 10 deep breaths every hour to prevent lung collapse.  If you develop cough, fever or shortness of breath return immediately to the emergency room.    Do not hesitate to return to the emergency room for any new, worsening or concerning symptoms.  Please obtain primary care using resource guide below. Let them know that you were seen in the emergency room and that they will need to obtain records for further outpatient management.   Transverse Process Fracture Each bone of the spine (vertebra) has portions of bone that extend off to either side of the spine. These portions of bone are called transverse processes. A transverse process fracture, which is also called a rotation spine fracture, is a break in a transverse process. CAUSES This condition may be caused by:  A fall from a height.  A car accident.  A sports injury.  A gunshot wound.  A hard, direct hit to the back. This kind of fracture often results from a sudden and severe bending of the spine to one side. RISK FACTORS This condition is more likely to develop in:  People who have thinning and loss of density in the bones (osteoporosis).  People who play a contact sport. SYMPTOMS The main symptom of this condition is back pain. The pain may be felt on the side of the spine (flank) where the fracture is. It may get worse when you move or take deep a deep breath. DIAGNOSIS This condition may be diagnosed based on symptoms, a medical history, and a physical exam. During the physical exam, your health care provider may tap along the length of your spine to see where you feel pain. Imaging tests may be done to confirm the diagnosis. They may  include:  X-rays.  A CT scan.  MRI. TREATMENT Most transverse process fractures heal on their own with time and with rest. Treatment may involve supportive care, such as:  A back brace.  Activity limits.  Pain medicine.  Muscle-relaxing medicine.  Physical therapy. HOME CARE INSTRUCTIONS General Instructions  Take medicines only as directed by your health care provider.  Do not drive or operate heavy machinery while taking pain medicine.  Wear your neck or back brace as directed by your health care provider.  Keep all follow-up visits as directed by your health care provider. This is important. It can help to prevent permanent injury, disability, and long-lasting (chronic) pain. Activity  Stay in bed (on bed rest) only as directed by your health care provider. Being on bed rest for too long can make your condition worse.  Return to your normal activities when your health care provider says it is okay. Ask if there are any activities that you should not do.  Do your physical therapy as recommended by your health care provider. SEEK MEDICAL CARE IF:  You have a fever.  You develop a cough that makes your pain worse.  Your pain medicine is not helping.  Your pain does not get better over time.  You cannot return to your normal activities as planned or expected. SEEK IMMEDIATE MEDICAL CARE IF:  Your pain is very bad and it suddenly gets worse.  You are unable to move any body part (paralysis) that is below  the level of your injury.  You have numbness, tingling, or weakness in any body part that is below the level of your injury.  You cannot control your bladder or bowels.   This information is not intended to replace advice given to you by your health care provider. Make sure you discuss any questions you have with your health care provider.   Document Released: 02/20/2007 Document Revised: 03/22/2015 Document Reviewed: 11/09/2014 Elsevier Interactive Patient  Education 2016 [place] [employer] 211 is a great source of information about community services available.  Access by dialing 2-1-1 from anywhere in [place], or by website -  CustodianSupply.fi.   Other Local Resources (Updated 11/2015)  [place]    Phone Number and Address  [place] medical care - 1st and 3rd Saturday of every month  Must not qualify for public or private insurance and must have limited income [phone] [address], [place]  Child care  Emergency assistance for housing and [employer]  Medicaid [phone] [address], [place] [address]   [facility] Department  Low-cost medical care for children, communicable diseases, sexually-transmitted diseases, immunizations, maternity care, womens health and family planning [phone] [address], [place] [address]  [facility]ication [facility]   Medication assistance for [facility] residents  Must meet income requirements [phone] [place], [place].    [place]  Child care  Emergency assistance for housing and [employer]  Medicaid [phone] [address] [place], [place] [address]  [facility] [place]   Low-cost medical care,   Monday through Friday, 9 am to 6 pm.   Accepts Medicare/Medicaid, and self-pay [phone] [address] [place], [place] [address]  [facility] for [address] care - Monday through Friday, 8:30 am - 5:30 pm  Accepts Medicaid and self-pay [phone] [address] [place], [place] [address]   [facility]  Primary medical care, including for those with sickle  cell disease  Accepts Medicare, Medicaid, insurance and self-pay [phone] [place], [place]  [facility]   Primary medical care  Accepts Medicare, [place], insurance and self-pay [phone] [address], [place], [place] [address]   [facility] Department of Social Services  Child care  Emergency assistance for housing and [employer]  Medicaid [phone] [address] [place], [place] [address]  [place] Department of Health and [employer]  Child care  Emergency assistance for housing and [employer]  Medicaid [phone] [place], [place] [address]   [facility] Medication Assistance Program  Medication assistance for [facility] residents with no insurance only  Must have a primary care doctor [phone] [address], [place], [place]  [facility]   Primary medical care  [place], [place], insurance  [phone] [address] [place], [place]  [facility]   Medication assistance [phone]  [facility]   Primary medical care  Accepts Medicare, [place], insurance and self-pay [phone] [address],  [address]  [facility]   Primary medical care  Accepts Medicare, [place], insurance and self-pay [phone] [address]  [place], [place] [address]  [place] residents between the ages of 60 and 63 who do not have any form of health insurance, Medicare, [place], or [place] benefits.  Services are provided free of charge to uninsured patients who fall within federal poverty guidelines.    Hours: Tuesdays and Thursdays, 4:15 - 8 pm [phone] [address] [place], [place] [address]  [facility]     Primary medical care  Dental care  Nutritional  counseling  Pharmacy  Accepts Medicaid, Medicare, most insurance.  Fees are adjusted based on ability to pay.   [place], [place] [address], [place], [place] [facility], [place] , [place] [facility] [place], [place]  [facility]  Womens health and family planning [phone] [place]. [place], [place] care  Emergency assistance for housing and [employer]  Medicaid [phone] [address] [place], [place] [address]   [facility]    Ages 23 and older  Hours: Mondays and Thursdays, 7:00 am - 9:00 am Patients are seen on a first come, first served basis. [phone], ext. [place], [place]  Child care  Emergency assistance for housing and [employer]  Medicaid [phone] [address], [place] [address]  [facility] [place]  Medication assistance  Rental assistance  Food pantry  Medication assistance  Housing assistance  Emergency food distribution  Utility assistance [place], [place]  [place] [place], [place] [address] Hours: Tuesdays and Thursdays from 9am - 12 noon by appointment only  [place], [place] [address]  [facility] [place] private insurance, [place], and [place].  Payment is based on a sliding scale for those without insurance.  Hours: Mondays, Tuesdays and Thursdays, 8:30 am - 5:30 pm.   [phone] [place], [place]   [facility] - Family Medicine at [facility], [place], and [place].  Payment is based on a sliding scale for those without insurance. [phone] [address] [place], [place]  [facility] - Pediatrics at [address], research (medical), [employer], and [place].  Payment is based on a sliding scale for those without insurance [phone] [address], [employer], [place]  [facility] - Pediatrics at [employer], [place], and [place].  Payment is based on a sliding scale for those without insurance. [phone] [place], [place]  [facility] - Pediatrics at [facility], [place], and [place].  Payment is based on a sliding scale for those without insurance. [phone], ext. [phone] [place], [place].    [place] care.  Accepts Medicaid and self-pay. [place], [place]

## 2016-05-21 NOTE — ED Notes (Signed)
PT DISCHARGED. INSTRUCTIONS AND PRESCRIPTION GIVEN. AAOX4. PT IN NO APPARENT DISTRESS. THE OPPORTUNITY TO ASK QUESTIONS WAS PROVIDED. 
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
## 2016-05-22 ENCOUNTER — Encounter (HOSPITAL_COMMUNITY): Payer: Self-pay

## 2016-05-22 ENCOUNTER — Emergency Department (HOSPITAL_COMMUNITY)
Admission: EM | Admit: 2016-05-22 | Discharge: 2016-05-22 | Disposition: A | Discharge status: Home / Self Care | Payer: Medicaid Other | Attending: Emergency Medicine | Admitting: Emergency Medicine

## 2016-05-22 DIAGNOSIS — Y939 Activity, unspecified: Secondary | ICD-10-CM | POA: Insufficient documentation

## 2016-05-22 DIAGNOSIS — Z87891 Personal history of nicotine dependence: Secondary | ICD-10-CM | POA: Insufficient documentation

## 2016-05-22 DIAGNOSIS — F329 Major depressive disorder, single episode, unspecified: Secondary | ICD-10-CM | POA: Insufficient documentation

## 2016-05-22 DIAGNOSIS — Y999 Unspecified external cause status: Secondary | ICD-10-CM | POA: Insufficient documentation

## 2016-05-22 DIAGNOSIS — X58XXXA Exposure to other specified factors, initial encounter: Secondary | ICD-10-CM | POA: Insufficient documentation

## 2016-05-22 DIAGNOSIS — Z79891 Long term (current) use of opiate analgesic: Secondary | ICD-10-CM | POA: Insufficient documentation

## 2016-05-22 DIAGNOSIS — S32008D Other fracture of unspecified lumbar vertebra, subsequent encounter for fracture with routine healing: Secondary | ICD-10-CM | POA: Insufficient documentation

## 2016-05-22 DIAGNOSIS — Y929 Unspecified place or not applicable: Secondary | ICD-10-CM | POA: Insufficient documentation

## 2016-05-22 MED ORDER — NAPROXEN 500 MG PO TABS: 500.0000 mg | ORAL_TABLET | Freq: Two times a day (BID) | ORAL | Status: DC

## 2016-05-22 MED ORDER — HYDROMORPHONE HCL 1 MG/ML IJ SOLN
0.5000 mg | Freq: Once | INTRAMUSCULAR | Status: AC
  Administered 2016-05-22: 0.5 mg via INTRAVENOUS
  Filled 2016-05-22: qty 1

## 2016-05-22 MED ORDER — HYDROMORPHONE HCL 1 MG/ML IJ SOLN
1.0000 mg | Freq: Once | INTRAMUSCULAR | Status: AC
  Administered 2016-05-22: 1 mg via INTRAVENOUS
  Filled 2016-05-22: qty 1

## 2016-05-22 MED ORDER — ONDANSETRON HCL 4 MG/2ML IJ SOLN
4.0000 mg | Freq: Once | INTRAMUSCULAR | Status: AC
  Administered 2016-05-22: 4 mg via INTRAVENOUS
  Filled 2016-05-22: qty 2

## 2016-05-22 MED ORDER — KETOROLAC TROMETHAMINE 30 MG/ML IJ SOLN
30.0000 mg | Freq: Once | INTRAMUSCULAR | Status: AC
  Administered 2016-05-22: 30 mg via INTRAVENOUS
  Filled 2016-05-22: qty 1

## 2016-05-22 NOTE — ED Notes (Signed)
Discharge instructions, follow up care, and rx x1 reviewed with patient. Patient verbalized understanding. 
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
## 2016-05-22 NOTE — ED Notes (Signed)
PA at bedside.

## 2016-05-22 NOTE — Discharge Instructions (Signed)
Transverse Process Fracture   Each bone of the spine (vertebra) has portions of bone that extend off to either side of the spine. These portions of bone are called transverse processes. A transverse process fracture, which is also called a rotation spine fracture, is a break in a transverse process.  CAUSES  This condition may be caused by:  A fall from a height.  A car accident.  A sports injury.  A gunshot wound.  A hard, direct hit to the back. This kind of fracture often results from a sudden and severe bending of the spine to one side.  RISK FACTORS  This condition is more likely to develop in:  People who have thinning and loss of density in the bones (osteoporosis).  People who play a contact sport. SYMPTOMS  The main symptom of this condition is back pain. The pain may be felt on the side of the spine (flank) where the fracture is. It may get worse when you move or take deep a deep breath.  DIAGNOSIS  This condition may be diagnosed based on symptoms, a medical history, and a physical exam. During the physical exam, your health care provider may tap along the length of your spine to see where you feel pain. Imaging tests may be done to confirm the diagnosis. They may include:  X-rays.  A CT scan.  MRI. TREATMENT  Most transverse process fractures heal on their own with time and with rest. Treatment may involve supportive care, such as:  A back brace.  Activity limits.  Pain medicine.  Muscle-relaxing medicine.  Physical therapy. HOME CARE INSTRUCTIONS  General Instructions  Take medicines only as directed by your health care provider.  Do not drive or operate heavy machinery while taking pain medicine.  Wear your neck or back brace as directed by your health care provider.  Keep all follow-up visits as directed by your health care provider. This is important. It can help to prevent permanent injury, disability, and long-lasting (chronic) pain. Activity  Stay in bed (on bed  rest) only as directed by your health care provider. Being on bed rest for too long can make your condition worse.  Return to your normal activities when your health care provider says it is okay. Ask if there are any activities that you should not do.  Do your physical therapy as recommended by your health care provider. SEEK MEDICAL CARE IF:  You have a fever.  You develop a cough that makes your pain worse.  Your pain medicine is not helping.  Your pain does not get better over time.  You cannot return to your normal activities as planned or expected. SEEK IMMEDIATE MEDICAL CARE IF:  Your pain is very bad and it suddenly gets worse.  You are unable to move any body part (paralysis) that is below the level of your injury.  You have numbness, tingling, or weakness in any body part that is below the level of your injury.  You cannot control your bladder or bowels. This information is not intended to replace advice given to you by your health care provider. Make sure you discuss any questions you have with your health care provider.  Document Released: 02/20/2007 Document Revised: 03/22/2015 Document Reviewed: 11/09/2014  Elsevier Interactive Patient Education [employer].

## 2016-05-24 ENCOUNTER — Telehealth: Payer: Self-pay | Admitting: *Deleted

## 2016-05-24 NOTE — ED Notes (Signed)
Contacted by patient requesting work note adjustment.  States she has work note restricted to no bending and no lifting >5 lbs until 06/03/2016.  Requests be changed to minimal bending and no lifting > 15 lbs.  Advised that adjustments to limitations set forth previously can only be changed by a physician.

## 2017-02-12 ENCOUNTER — Encounter (HOSPITAL_COMMUNITY): Payer: Self-pay

## 2017-02-12 ENCOUNTER — Emergency Department (HOSPITAL_COMMUNITY)
Admission: EM | Admit: 2017-02-12 | Discharge: 2017-02-12 | Disposition: A | Discharge status: Home / Self Care | Payer: Medicaid Other | Attending: Emergency Medicine | Admitting: Emergency Medicine

## 2017-02-12 DIAGNOSIS — Z79899 Other long term (current) drug therapy: Secondary | ICD-10-CM | POA: Insufficient documentation

## 2017-02-12 DIAGNOSIS — Z87891 Personal history of nicotine dependence: Secondary | ICD-10-CM | POA: Insufficient documentation

## 2017-02-12 DIAGNOSIS — M546 Pain in thoracic spine: Secondary | ICD-10-CM | POA: Insufficient documentation

## 2017-02-12 DIAGNOSIS — M549 Dorsalgia, unspecified: Secondary | ICD-10-CM

## 2017-02-12 DIAGNOSIS — R590 Localized enlarged lymph nodes: Secondary | ICD-10-CM | POA: Insufficient documentation

## 2017-02-12 DIAGNOSIS — Z91010 Allergy to peanuts: Secondary | ICD-10-CM | POA: Insufficient documentation

## 2017-02-12 DIAGNOSIS — R591 Generalized enlarged lymph nodes: Secondary | ICD-10-CM

## 2017-02-12 MED ORDER — IBUPROFEN 800 MG PO TABS: 800.0000 mg | ORAL_TABLET | Freq: Three times a day (TID) | ORAL | 0 refills | Status: DC

## 2017-02-12 MED ORDER — METHOCARBAMOL 500 MG PO TABS: 500.0000 mg | ORAL_TABLET | Freq: Two times a day (BID) | ORAL | 0 refills | Status: DC

## 2017-02-12 NOTE — ED Triage Notes (Signed)
Patient c/o gland swelling bilateral neck x 2 weeks, worse in the past week. Patient states no appetite x 2 weeks. Patient states a fever 2 days ago of 100.9.

## 2017-02-12 NOTE — ED Notes (Signed)
Pt refused to take discharge paperwork. Pt refused to answer questions at time of discharge such as pain level.

## 2017-02-12 NOTE — Discharge Instructions (Signed)
You do have some swollen lymph nodes in your neck.  Recommend tylenol or motrin for this.  Can use warm compresses as well.  You will need to have this recheck if not improving. Follow-up with your primary care doctor. Return here for new concerns.

## 2017-02-12 NOTE — ED Provider Notes (Signed)
[facility] DEPT Provider Note   CSN: [id number] Arrival date & time: 02/12/17  1210   By signing my name below, I, Neta Mends, attest that this documentation has been prepared under the direction and in the presence of Quincy Carnes, PA-C. Electronically Signed: Neta Mends, ED Scribe. 02/12/2017. 12:57 PM.   History   Chief Complaint Chief Complaint  Patient presents with  . Lymphadenopathy    slightly swollen and tender to touch  . Back Pain   The history is provided by the patient. No language interpreter was used.   HPI Comments:  Maria Sampson is a 26 y.o. female who presents to the Emergency Department complaining of worsening small areas of swelling to her neck x 2 weeks.  States initially she felt that the areas were getting larger, but mostly she has not noted any improvement. Reports she had a fever a few days ago, none currently. She's not had any sore throat, cough, ear pain, nasal congestion, or sick contacts. Denies any chest pain or shortness of breath. States she has also had some back pain. Reports she works at [employer] and has to [employer] all during the day. She's not had any numbness or weakness of her arms or legs. No bowel or bladder incontinence. She does have history of lumbar transverse process fracture in the past, reports she's not had any ongoing issues with this. No urinary symptoms or abdominal pain.  She's not been taking any medications for her symptoms.  She does have PCP, has not followed up with them about this.  Past Medical History:  Diagnosis Date  . Depression   . Otitis externa due to Pseudomonas aeruginosa 04/2014    Patient Active Problem List   Diagnosis Date Noted  . BPV (benign positional vertigo) 10/27/2015  . Malnutrition of moderate degree 10/26/2015  . Abdominal pain 10/23/2015  . Nausea & vomiting 10/23/2015  . Sepsis ([place]) 10/23/2015  . Fibroid, uterine 10/23/2015  . Ileus ([place]) 10/23/2015  . Normocytic anemia  10/23/2015  . Overdose 06/03/2014  . Depressive disorder 06/03/2014  . Suicidal ideation 06/03/2014  . Otitis externa due to Pseudomonas aeruginosa 05/03/2014    History reviewed. No pertinent surgical history.  OB History    No data available       Home Medications    Prior to Admission medications   Medication Sig Start Date End Date Taking? Authorizing Provider  acetaminophen (TYLENOL) 500 MG tablet Take 1,000 mg by mouth every 6 (six) hours as needed for mild pain, moderate pain, fever or headache.    Historical Provider, MD  naproxen (NAPROSYN) 500 MG tablet Take 1 tablet (500 mg total) by mouth 2 (two) times daily. 05/22/16   Gloriann Loan, PA-C  oxyCODONE-acetaminophen (PERCOCET) 5-325 MG tablet Take 1 tablet by mouth every 4 (four) hours as needed. 05/21/16   Elmyra Ricks Pisciotta, PA-C    Family History Family History  Problem Relation Age of Onset  . Crohn's disease Other     Social History Social History  Substance Use Topics  . Smoking status: Former Smoker    Types: Cigarettes    Quit date: 03/03/2014  . Smokeless tobacco: Never Used  . Alcohol use Yes     Comment: social     Allergies   Peanut-containing drug products and Food   Review of Systems Review of Systems  Constitutional: Positive for appetite change and fever.  HENT: Negative for sore throat.   Respiratory: Negative for cough.   Musculoskeletal:  Positive for back pain.  Hematological: Positive for adenopathy.  All other systems reviewed and are negative.    Physical Exam Updated Vital Signs BP (!) 140/98 (BP Location: Right Arm)   Pulse 82   Temp 97.8 F (36.6 C) (Oral)   Resp 12   Ht 5\' 11"  (1.803 m)   Wt 165 lb (74.8 kg)   LMP 01/29/2017   SpO2 100%   BMI 23.01 kg/m   Physical Exam  Constitutional: She is oriented to person, place, and time. She appears well-developed and well-nourished.  Somewhat hostile during exam, texting on phone, refusing to engage during exam  HENT:  Head:  Normocephalic and atraumatic.  Right Ear: Tympanic membrane and ear canal normal.  Left Ear: Tympanic membrane and ear canal normal.  Nose: Nose normal.  Mouth/Throat: Uvula is midline, oropharynx is clear and moist and mucous membranes are normal.  Some mild PND noted, tonsils overall normal in appearance bilaterally without exudate; uvula midline without evidence of peritonsillar abscess; handling secretions appropriately; no difficulty swallowing or speaking; normal phonation without stridor Mucous membranes moist  Eyes: Conjunctivae and EOM are normal. Pupils are equal, round, and reactive to light.  Neck: Normal range of motion.  Cardiovascular: Normal rate, regular rhythm and normal heart sounds.   Pulmonary/Chest: Effort normal and breath sounds normal.  Abdominal: Soft. Bowel sounds are normal.  Musculoskeletal: Normal range of motion.  Muscular tenderness of the thoracic paraspinal muscles, there is no midline tenderness, step-off, or deformity. Moving arms and legs equally without difficulty, normal gait  Lymphadenopathy:  + cervical lymphadenopathy bilaterally, TTP  Neurological: She is alert and oriented to person, place, and time.  Skin: Skin is warm and dry.  Psychiatric: She has a normal mood and affect.  Nursing note and vitals reviewed.    ED Treatments / Results  DIAGNOSTIC STUDIES:  Oxygen Saturation is 100% on RA, normal by my interpretation.    COORDINATION OF CARE:  12:55 PM Discussed treatment plan with pt at bedside and pt agreed to plan.   Labs (all labs ordered are listed, but only abnormal results are displayed) Labs Reviewed - No data to display  EKG  EKG Interpretation None       Radiology No results found.  Procedures Procedures (including critical care time)  Medications Ordered in ED Medications - No data to display   Initial Impression / Assessment and Plan / ED Course  I have reviewed the triage vital signs and the nursing  notes.  Pertinent labs & imaging results that were available during my care of the patient were reviewed by me and considered in my medical decision making (see chart for details).  26 year old female here with multiple complaints.  1.  Lymphadenopathy. Has been present for 2 weeks. No associated URI type symptoms. No dental issues.  No sick contacts. Some small, tender, cervical lymphadenopathy noted on exam. She is afebrile and nontoxic. Vitals are stable. Do not see evidence of any underlying infectious process. May be viral etiology. Mucous membranes moist, does not appear clinically dehydrated.  VSS.  Encouraged Tylenol or Motrin as needed for pain, warm compresses. Will need this rechecked with PCP if not improving in the next few days  2.  Back pain. No midline tenderness or deformity. No new injury, trauma, or fall.  Has apparent muscular tenderness without noted spasm. Neurologically intact. No focal deficits. She works at [employer], this is likely muscular strain. Recommended NSAIDs, muscle relaxer.  Also follow-up with PCP.  As  I was discussing patient's proposed treatment plan, she appeared very aggressive and hostile towards me. I asked her multiple times what her concerns were if any, she continued texting on her cell phone replying "I'm good I'm good".  I encouraged patient to speak freely regarding her concerns about her medical care, however she stated she wished to leave.  Final Clinical Impressions(s) / ED Diagnoses   Final diagnoses:  Lymphadenopathy  Acute bilateral back pain, unspecified back location    New Prescriptions New Prescriptions   IBUPROFEN (ADVIL,MOTRIN) 800 MG TABLET    Take 1 tablet (800 mg total) by mouth 3 (three) times daily.   METHOCARBAMOL (ROBAXIN) 500 MG TABLET    Take 1 tablet (500 mg total) by mouth 2 (two) times daily.   I personally performed the services described in this documentation, which was scribed in my presence. The recorded information has  been reviewed and is accurate.    Larene Pickett, PA-C 02/12/17 [place] Nayleen Janosik, PA-C 02/12/17 1324    Isla Pence, MD 02/12/17 1400

## 2017-02-20 ENCOUNTER — Inpatient Hospital Stay (HOSPITAL_COMMUNITY)
Admission: EM | Admit: 2017-02-20 | Discharge: 2017-02-26 | DRG: 918 | Disposition: A | Discharge status: Other Acute Inpatient Hospital | Payer: Self-pay | Attending: Family Medicine | Admitting: Family Medicine

## 2017-02-20 ENCOUNTER — Encounter (HOSPITAL_COMMUNITY): Payer: Self-pay | Admitting: *Deleted

## 2017-02-20 DIAGNOSIS — I10 Essential (primary) hypertension: Secondary | ICD-10-CM | POA: Diagnosis not present

## 2017-02-20 DIAGNOSIS — K5900 Constipation, unspecified: Secondary | ICD-10-CM | POA: Diagnosis present

## 2017-02-20 DIAGNOSIS — Z79899 Other long term (current) drug therapy: Secondary | ICD-10-CM | POA: Diagnosis not present

## 2017-02-20 DIAGNOSIS — T484X2A Poisoning by expectorants, intentional self-harm, initial encounter: Secondary | ICD-10-CM | POA: Diagnosis present

## 2017-02-20 DIAGNOSIS — R945 Abnormal results of liver function studies: Secondary | ICD-10-CM

## 2017-02-20 DIAGNOSIS — R109 Unspecified abdominal pain: Secondary | ICD-10-CM | POA: Diagnosis present

## 2017-02-20 DIAGNOSIS — F1210 Cannabis abuse, uncomplicated: Secondary | ICD-10-CM | POA: Diagnosis not present

## 2017-02-20 DIAGNOSIS — F329 Major depressive disorder, single episode, unspecified: Secondary | ICD-10-CM | POA: Diagnosis not present

## 2017-02-20 DIAGNOSIS — T391X2A Poisoning by 4-Aminophenol derivatives, intentional self-harm, initial encounter: Secondary | ICD-10-CM | POA: Diagnosis not present

## 2017-02-20 DIAGNOSIS — Z791 Long term (current) use of non-steroidal anti-inflammatories (NSAID): Secondary | ICD-10-CM

## 2017-02-20 DIAGNOSIS — Z87891 Personal history of nicotine dependence: Secondary | ICD-10-CM

## 2017-02-20 DIAGNOSIS — R7989 Other specified abnormal findings of blood chemistry: Secondary | ICD-10-CM

## 2017-02-20 DIAGNOSIS — F32A Depression, unspecified: Secondary | ICD-10-CM | POA: Diagnosis present

## 2017-02-20 DIAGNOSIS — T1491XA Suicide attempt, initial encounter: Secondary | ICD-10-CM | POA: Diagnosis present

## 2017-02-20 DIAGNOSIS — F17210 Nicotine dependence, cigarettes, uncomplicated: Secondary | ICD-10-CM | POA: Diagnosis present

## 2017-02-20 LAB — COMPREHENSIVE METABOLIC PANEL
ALBUMIN: 4.3 g/dL (ref 3.5–5.0)
ALK PHOS: 62 U/L (ref 38–126)
ALT: 17 U/L (ref 14–54)
ALT: 21 U/L (ref 14–54)
ANION GAP: 9 (ref 5–15)
AST: 25 U/L (ref 15–41)
AST: 30 U/L (ref 15–41)
Albumin: 4.1 g/dL (ref 3.5–5.0)
Alkaline Phosphatase: 64 U/L (ref 38–126)
Anion gap: 10 (ref 5–15)
BUN: 6 mg/dL (ref 6–20)
BUN: 7 mg/dL (ref 6–20)
CALCIUM: 9.4 mg/dL (ref 8.9–10.3)
CHLORIDE: 105 mmol/L (ref 101–111)
CHLORIDE: 105 mmol/L (ref 101–111)
CO2: 24 mmol/L (ref 22–32)
CO2: 24 mmol/L (ref 22–32)
CREATININE: 0.79 mg/dL (ref 0.44–1.00)
Calcium: 9.4 mg/dL (ref 8.9–10.3)
Creatinine, Ser: 0.82 mg/dL (ref 0.44–1.00)
GFR calc non Af Amer: >60 mL/min (ref >60)
GFR calc non Af Amer: >60 mL/min (ref >60)
GLUCOSE: 114 mg/dL — AB (ref 65–99)
Glucose, Bld: 102 mg/dL — ABNORMAL HIGH (ref 65–99)
POTASSIUM: 4 mmol/L (ref 3.5–5.1)
Potassium: 3.6 mmol/L (ref 3.5–5.1)
SODIUM: 139 mmol/L (ref 135–145)
SODIUM: 138 mmol/L (ref 135–145)
Total Bilirubin: 0.6 mg/dL (ref 0.3–1.2)
Total Bilirubin: 0.9 mg/dL (ref 0.3–1.2)
Total Protein: 6.7 g/dL (ref 6.5–8.1)
Total Protein: 6.6 g/dL (ref 6.5–8.1)

## 2017-02-20 LAB — CBC
HEMATOCRIT: 37.8 % (ref 36.0–46.0)
HEMOGLOBIN: 12.8 g/dL (ref 12.0–15.0)
MCH: 28.6 pg (ref 26.0–34.0)
MCHC: 33.9 g/dL (ref 30.0–36.0)
MCV: 84.4 fL (ref 78.0–100.0)
Platelets: 277 10*3/uL (ref 150–400)
RBC: 4.48 MIL/uL (ref 3.87–5.11)
RDW: 14.6 % (ref 11.5–15.5)
WBC: 7.5 10*3/uL (ref 4.0–10.5)

## 2017-02-20 LAB — ACETAMINOPHEN LEVEL
Acetaminophen (Tylenol), Serum: 210 ug/mL (ref 10–30)
Acetaminophen (Tylenol), Serum: 178 ug/mL (ref 10–30)

## 2017-02-20 LAB — CBG MONITORING, ED: Glucose-Capillary: 111 mg/dL — ABNORMAL HIGH (ref 65–99)

## 2017-02-20 LAB — RAPID URINE DRUG SCREEN, HOSP PERFORMED
AMPHETAMINES: NOT DETECTED
BARBITURATES: NOT DETECTED
Benzodiazepines: NOT DETECTED
COCAINE: NOT DETECTED
OPIATES: NOT DETECTED
Tetrahydrocannabinol: POSITIVE — AB

## 2017-02-20 LAB — LACTIC ACID, PLASMA: LACTIC ACID, VENOUS: 1.6 mmol/L (ref 0.5–1.9)

## 2017-02-20 LAB — PROTIME-INR
INR: 1.2
Prothrombin Time: 15.3 seconds — ABNORMAL HIGH (ref 11.4–15.2)

## 2017-02-20 LAB — SALICYLATE LEVEL

## 2017-02-20 LAB — PREGNANCY, URINE: PREG TEST UR: NEGATIVE

## 2017-02-20 LAB — ETHANOL: Alcohol, Ethyl (B): <5 mg/dL (ref <5)

## 2017-02-20 MED ORDER — DEXTROSE 5 % IV SOLN
15.0000 mg/kg/h | INTRAVENOUS | Status: DC
  Filled 2017-02-20: qty 200

## 2017-02-20 MED ORDER — ENOXAPARIN SODIUM 40 MG/0.4ML ~~LOC~~ SOLN
40.0000 mg | SUBCUTANEOUS | Status: DC
  Administered 2017-02-20: 40 mg via SUBCUTANEOUS
  Filled 2017-02-20 (×3): qty 0.4

## 2017-02-20 MED ORDER — ACETYLCYSTEINE 20 % IN SOLN
140.0000 mg/kg | Freq: Once | RESPIRATORY_TRACT | Status: AC
  Administered 2017-02-20: 10480 mg via ORAL
  Filled 2017-02-20: qty 60

## 2017-02-20 MED ORDER — DEXTROSE 5 % IV SOLN
15.0000 mg/kg/h | INTRAVENOUS | Status: DC
  Administered 2017-02-20: 15 mg/kg/h via INTRAVENOUS
  Filled 2017-02-20 (×2): qty 150

## 2017-02-20 MED ORDER — DIPHENHYDRAMINE HCL 25 MG PO CAPS
25.0000 mg | ORAL_CAPSULE | Freq: Once | ORAL | Status: AC
  Administered 2017-02-20: 25 mg via ORAL
  Filled 2017-02-20: qty 1

## 2017-02-20 MED ORDER — METOCLOPRAMIDE HCL 5 MG/ML IJ SOLN
10.0000 mg | Freq: Once | INTRAMUSCULAR | Status: DC
Start: 2017-02-20 — End: 2017-02-26
  Filled 2017-02-20: qty 2

## 2017-02-20 MED ORDER — LORAZEPAM 0.5 MG PO TABS
2.0000 mg | ORAL_TABLET | Freq: Once | ORAL | Status: DC
Start: 2017-02-20 — End: 2017-02-20

## 2017-02-20 MED ORDER — LORAZEPAM 2 MG/ML IJ SOLN
0.5000 mg | Freq: Four times a day (QID) | INTRAMUSCULAR | Status: DC | PRN
  Filled 2017-02-20 (×2): qty 1

## 2017-02-20 MED ORDER — SODIUM CHLORIDE 0.9 % IV BOLUS (SEPSIS)
1000.0000 mL | Freq: Once | INTRAVENOUS | Status: AC
  Administered 2017-02-20: 1000 mL via INTRAVENOUS

## 2017-02-20 MED ORDER — ONDANSETRON HCL 4 MG/2ML IJ SOLN
4.0000 mg | Freq: Once | INTRAMUSCULAR | Status: AC
  Administered 2017-02-20: 4 mg via INTRAVENOUS
  Filled 2017-02-20: qty 2

## 2017-02-20 MED ORDER — LORAZEPAM 2 MG/ML PO CONC
2.0000 mg | ORAL | Status: AC
  Administered 2017-02-20: 2 mg via ORAL
  Filled 2017-02-20: qty 1

## 2017-02-20 MED ORDER — ACETYLCYSTEINE LOAD VIA INFUSION
150.0000 mg/kg | Freq: Once | INTRAVENOUS | Status: DC
  Filled 2017-02-20: qty 281

## 2017-02-20 MED ORDER — DICLOFENAC SODIUM 1 % TD GEL
4.0000 g | Freq: Three times a day (TID) | TRANSDERMAL | Status: DC | PRN
  Administered 2017-02-20: 17:00:00 4 g via TOPICAL
  Filled 2017-02-20: qty 100

## 2017-02-20 MED ORDER — HYDRALAZINE HCL 20 MG/ML IJ SOLN: 5.0000 mg | INTRAMUSCULAR | Status: DC | PRN

## 2017-02-20 MED ORDER — ACETYLCYSTEINE 20 % IN SOLN
70.0000 mg/kg | RESPIRATORY_TRACT | Status: DC
  Administered 2017-02-20 – 2017-02-21 (×2): 5240 mg via ORAL
  Filled 2017-02-20 (×6): qty 30

## 2017-02-20 MED ORDER — TRAMADOL HCL 50 MG PO TABS
50.0000 mg | ORAL_TABLET | Freq: Four times a day (QID) | ORAL | Status: AC | PRN
  Administered 2017-02-20 – 2017-02-21 (×2): 50 mg via ORAL
  Filled 2017-02-20 (×2): qty 1

## 2017-02-20 MED ORDER — ONDANSETRON 4 MG PO TBDP
4.0000 mg | ORAL_TABLET | Freq: Three times a day (TID) | ORAL | Status: DC | PRN
  Administered 2017-02-20 – 2017-02-21 (×2): 4 mg via ORAL
  Filled 2017-02-20 (×2): qty 1

## 2017-02-20 MED ORDER — SODIUM CHLORIDE 0.9 % IV SOLN
INTRAVENOUS | Status: DC
  Administered 2017-02-20: 10:00:00 via INTRAVENOUS

## 2017-02-20 NOTE — ED Notes (Addendum)
Dr. Thayer Jew ( EDP) spoke with explained plan of care and admission plan to pt. , Candace RN will administer pt.'s acetylcysteine.

## 2017-02-20 NOTE — ED Notes (Signed)
Pt. Requesting IV be done after ativan kicks in. Admitting provider made aware.

## 2017-02-20 NOTE — ED Provider Notes (Signed)
[facility] DEPT Provider Note   CSN: [id number] Arrival date & time: 02/20/17  0253     History   Chief Complaint Chief Complaint  Patient presents with  . Depression  . Drug Overdose   LEVEL 5 CAVEAT DUE TO PSYCHIATRIC DISORDER HPI Maria Sampson is a 26 y.o. female.  The history is provided by the patient.  Drug Overdose   Ingestion  This is a new problem. Episode onset: just prior to arrival. The problem occurs constantly. The problem has been gradually worsening. Nothing aggravates the symptoms. Nothing relieves the symptoms.  Patient presents for overdose She admitted to taking 2 bottles of tylenol and also 1 bottle of nyquil She does not wish to provide any other details at this time   Past Medical History:  Diagnosis Date  . Depression   . Otitis externa due to Pseudomonas aeruginosa 04/2014    Patient Active Problem List   Diagnosis Date Noted  . BPV (benign positional vertigo) 10/27/2015  . Malnutrition of moderate degree 10/26/2015  . Abdominal pain 10/23/2015  . Nausea & vomiting 10/23/2015  . Sepsis ([place]) 10/23/2015  . Fibroid, uterine 10/23/2015  . Ileus ([place]) 10/23/2015  . Normocytic anemia 10/23/2015  . Overdose 06/03/2014  . Depressive disorder 06/03/2014  . Suicidal ideation 06/03/2014  . Otitis externa due to Pseudomonas aeruginosa 05/03/2014    History reviewed. No pertinent surgical history.  OB History    No data available       Home Medications    Prior to Admission medications   Medication Sig Start Date End Date Taking? Authorizing Provider  acetaminophen (TYLENOL) 500 MG tablet Take 1,000 mg by mouth every 6 (six) hours as needed for mild pain, moderate pain, fever or headache.    Historical Provider, MD  ibuprofen (ADVIL,MOTRIN) 800 MG tablet Take 1 tablet (800 mg total) by mouth 3 (three) times daily. 02/12/17   Larene Pickett, PA-C  methocarbamol (ROBAXIN) 500 MG tablet Take 1 tablet (500 mg total) by mouth 2 (two) times  daily. 02/12/17   Larene Pickett, PA-C  naproxen (NAPROSYN) 500 MG tablet Take 1 tablet (500 mg total) by mouth 2 (two) times daily. 05/22/16   Gloriann Loan, PA-C  oxyCODONE-acetaminophen (PERCOCET) 5-325 MG tablet Take 1 tablet by mouth every 4 (four) hours as needed. 05/21/16   Elmyra Ricks Pisciotta, PA-C    Family History Family History  Problem Relation Age of Onset  . Crohn's disease Other     Social History Social History  Substance Use Topics  . Smoking status: Former Smoker    Types: Cigarettes    Quit date: 03/03/2014  . Smokeless tobacco: Never Used  . Alcohol use Yes     Comment: social     Allergies   Peanut-containing drug products and Food   Review of Systems Review of Systems  Unable to perform ROS: Psychiatric disorder     Physical Exam Updated Vital Signs BP (!) 152/100 (BP Location: Left Arm)   Pulse 72   Temp 98 F (36.7 C) (Oral)   Resp 18   LMP 01/29/2017   SpO2 100%   Physical Exam CONSTITUTIONAL: Disheveled HEAD: Normocephalic/atraumatic EYES: EOMI/PERRL, no icterus ENMT: Mucous membranes moist NECK: supple no meningeal signs CV: S1/S2 noted, no murmurs/rubs/gallops noted LUNGS: Lungs are clear to auscultation bilaterally, no apparent distress ABDOMEN: soft, nontender NEURO: Pt is awake/alert, moves all extremitiesx4.  No facial droop.   EXTREMITIES: pulses normal/equal, full ROM SKIN: warm, color normal PSYCH: flat affect,  poor eye contact, refusing to answer questions  ED Treatments / Results  Labs (all labs ordered are listed, but only abnormal results are displayed) Labs Reviewed  COMPREHENSIVE METABOLIC PANEL - Abnormal; Notable for the following:       Result Value   Glucose, Bld 114 (*)    All other components within normal limits  ACETAMINOPHEN LEVEL - Abnormal; Notable for the following:    Acetaminophen (Tylenol), Serum 210 (*)    All other components within normal limits  RAPID URINE DRUG SCREEN, HOSP PERFORMED - Abnormal;  Notable for the following:    Tetrahydrocannabinol POSITIVE (*)    All other components within normal limits  CBG MONITORING, ED - Abnormal; Notable for the following:    Glucose-Capillary 111 (*)    All other components within normal limits  ETHANOL  SALICYLATE LEVEL  CBC  PREGNANCY, URINE  PROTIME-INR    EKG  EKG Interpretation  Date/Time:  Wednesday February 20 2017 03:33:20 EDT Ventricular Rate:  72 PR Interval:  142 QRS Duration: 86 QT Interval:  400 QTC Calculation: 438 R Axis:   88 Text Interpretation:  Normal sinus rhythm Early repolarization Normal ECG Confirmed by Christy Gentles  MD, Percival Glasheen ([id number]) on 02/20/2017 3:38:04 AM       Radiology No results found.  Procedures  Procedures  CRITICAL CARE Performed by: Sharyon Cable Total critical care time: 33 minutes Critical care time was exclusive of separately billable procedures and treating other patients. Critical care was necessary to treat or prevent imminent or life-threatening deterioration. Critical care was time spent personally by me on the following activities: development of treatment plan with patient and/or surrogate as well as nursing, discussions with consultants, evaluation of patient's response to treatment, examination of patient, obtaining history from patient or surrogate, ordering and performing treatments and interventions, ordering and review of laboratory studies, ordering and review of radiographic studies, pulse oximetry and re-evaluation of patient's condition. PATIENT WITH ACETAMINOPHEN OVERDOSE REQUIRING ADMISSION AND N-ACETYLCYSTEINE  Medications Ordered in ED Medications  acetylcysteine (MUCOMYST) 20 % nebulizer / oral solution 10,480 mg (10,480 mg Oral Given 02/20/17 0533)    Followed by  acetylcysteine (MUCOMYST) 20 % nebulizer / oral solution 5,240 mg (not administered)     Initial Impression / Assessment and Plan / ED Course  I have reviewed the triage vital signs and the nursing  notes.  Pertinent labs  results that were available during my care of the patient were reviewed by me and considered in my medical decision making (see chart for details).     4:50 AM Pt in the ED after tylenol overdose prior to arrival APAP level >150 N-acetylcystiene ordered and she will need admission Pt refusing to provide any other details at this time 5:12 AM Pt still refusing to provide further details and refusing to take antidote She is requesting female only providers I advised that it is important to start the antidote I have asked a female physician to speak to patient as I this is a medical emergency and she requires N-acetylcysteine 6:48 AM Pt was able to take 1st dose of n-acetylcysteine after speaking to one of our female physicians Will need admission   Pt to be admitted to [facility]  Final Clinical Impressions(s) / ED Diagnoses   Final diagnoses:  Intentional acetaminophen overdose, initial encounter [facility])    New Prescriptions New Prescriptions   No medications on file     Ripley Fraise, MD 02/20/17 [phone]

## 2017-02-20 NOTE — ED Notes (Signed)
Attempted report x1. 

## 2017-02-20 NOTE — ED Notes (Signed)
Attempted report 2x.

## 2017-02-20 NOTE — Consult Note (Signed)
[facility] Consult   Reason for Consult:  Intentional drug overdose as a suicide attempt Referring Physician:  Dr. Marily Memos Patient Identification: Maria Sampson MRN:  [id number] Principal Diagnosis: Tylenol overdose, intentional self-harm, initial encounter [facility]) Diagnosis:   Patient Active Problem List   Diagnosis Date Noted  . Suicide attempt [T14.91XA] 02/20/2017  . Tylenol overdose, intentional self-harm, initial encounter ([place]) [T39.1X2A] 02/20/2017  . Mild tetrahydrocannabinol (THC) abuse [F12.10] 02/20/2017  . Essential hypertension [I10] 02/20/2017  . BPV (benign positional vertigo) [H81.10] 10/27/2015  . Malnutrition of moderate degree [E44.0] 10/26/2015  . Abdominal pain [R10.9] 10/23/2015  . Nausea & vomiting [R11.2] 10/23/2015  . Sepsis ([place]) [A41.9] 10/23/2015  . Fibroid, uterine [D25.9] 10/23/2015  . Ileus ([place]) [K56.7] 10/23/2015  . Normocytic anemia [D64.9] 10/23/2015  . Overdose [T50.901A] 06/03/2014  . Depressive disorder [F32.9] 06/03/2014  . Suicidal ideation [R45.851] 06/03/2014  . Otitis externa due to Pseudomonas aeruginosa [B96.5, H62.40] 05/03/2014    Total Time spent with patient: 45 minutes  Subjective:   Maria Sampson is a 26 y.o. female patient admitted with Intentional Tylenol overdose.  HPI:  Maria Sampson  is a 26 years old female admitted to hospital with the increased symptoms of depression and possible cannabis abuse with the intentional Tylenol overdose as a suicide attempt. Reportedly patient was ingested 2 bottles of Tylenol and one bottle of NyQuil. Patient is appeared lying on his stretcher in emergency department, has a safety sitter next to her in the room and refused to provide details of her symptoms of depression, anxiety, psychosis and the suicidal/homicidal ideation, intention or plans. Patient reported that she does not want to talk much about her stressors but at the same time endorses ingestion of excessive  dose of Tylenol and NyQuil. Patient has been treated with the Mucomyst. Patient reportedly has headache since admitted to the hospital with intentional drug overdose. Patient urine drug screen is positive for tetrahydrocannabinol. Patient reportedly working with [employer] for the last 4 years. Patient serum acetaminophen levels are 210 g per mL which is toxic levels.  Past Psychiatric History: Patient has no previous acute psychiatric hospitalization.  Risk to Self: Is patient at risk for suicide?: Yes Risk to Others:   Prior Inpatient Therapy:   Prior Outpatient Therapy:    Past Medical History:  Past Medical History:  Diagnosis Date  . Depression   . Otitis externa due to Pseudomonas aeruginosa 04/2014   History reviewed. No pertinent surgical history. Family History:  Family History  Problem Relation Age of Onset  . Crohn's disease Other    Family Psychiatric  History: Patient denied family history of psychotic illness. Social History:  History  Alcohol Use  . Yes    Comment: social     History  Drug Use No    Social History   Social History  . Marital status: Single    Spouse name: N/A  . Number of children: N/A  . Years of education: N/A   Social History Main Topics  . Smoking status: Former Smoker    Types: Cigarettes    Quit date: 03/03/2014  . Smokeless tobacco: Never Used  . Alcohol use Yes     Comment: social  . Drug use: No  . Sexual activity: Yes    Birth control/ protection: Condom   Other Topics Concern  . None   Social History Narrative  . None   Additional Social History:    Allergies:   Allergies  Allergen Reactions  .  Peanut-Containing Drug Products Anaphylaxis and Hives  . Food Hives, Itching and Swelling    PEANUTS    Labs:  Results for orders placed or performed during the hospital encounter of 02/20/17 (from the past 48 hour(s))  Comprehensive metabolic panel     Status: Abnormal   Collection Time: 02/20/17  3:20 AM  Result Value  Ref Range   Sodium 139 135 - 145 mmol/L   Potassium 3.6 3.5 - 5.1 mmol/L   Chloride 105 101 - 111 mmol/L   CO2 24 22 - 32 mmol/L   Glucose, Bld 114 (H) 65 - 99 mg/dL   BUN 6 6 - 20 mg/dL   Creatinine, Ser 0.79 0.44 - 1.00 mg/dL   Calcium 9.4 8.9 - 10.3 mg/dL   Total Protein 6.7 6.5 - 8.1 g/dL   Albumin 4.3 3.5 - 5.0 g/dL   AST 25 15 - 41 U/L   ALT 17 14 - 54 U/L   Alkaline Phosphatase 62 38 - 126 U/L   Total Bilirubin 0.6 0.3 - 1.2 mg/dL   GFR calc non Af Amer >60 >60 mL/min   GFR calc Af Amer >60 >60 mL/min    Comment: (NOTE) The eGFR has been calculated using the CKD EPI equation. This calculation has not been validated in all clinical situations. eGFR's persistently <60 mL/min signify possible Chronic Kidney Disease.    Anion gap 10 5 - 15  Ethanol     Status: None   Collection Time: 02/20/17  3:20 AM  Result Value Ref Range   Alcohol, Ethyl (B) <5 <5 mg/dL    Comment:        LOWEST DETECTABLE LIMIT FOR SERUM ALCOHOL IS 5 mg/dL FOR MEDICAL PURPOSES ONLY   Salicylate level     Status: None   Collection Time: 02/20/17  3:20 AM  Result Value Ref Range   Salicylate Lvl <1.3 2.8 - 30.0 mg/dL  Acetaminophen level     Status: Abnormal   Collection Time: 02/20/17  3:20 AM  Result Value Ref Range   Acetaminophen (Tylenol), Serum 210 (HH) 10 - 30 ug/mL    Comment:        THERAPEUTIC CONCENTRATIONS VARY SIGNIFICANTLY. A RANGE OF 10-30 ug/mL MAY BE AN EFFECTIVE CONCENTRATION FOR MANY PATIENTS. HOWEVER, SOME ARE BEST TREATED AT CONCENTRATIONS OUTSIDE THIS RANGE. ACETAMINOPHEN CONCENTRATIONS >150 ug/mL AT 4 HOURS AFTER INGESTION AND >50 ug/mL AT 12 HOURS AFTER INGESTION ARE OFTEN ASSOCIATED WITH TOXIC REACTIONS. CRITICAL RESULT CALLED TO, READ BACK BY AND VERIFIED WITH: GODINEZ L,RN 02/20/17 0422 WAYK GODINEZ L,EMT OLDLAND E,RN   cbc     Status: None   Collection Time: 02/20/17  3:20 AM  Result Value Ref Range   WBC 7.5 4.0 - 10.5 K/uL   RBC 4.48 3.87 - 5.11  MIL/uL   Hemoglobin 12.8 12.0 - 15.0 g/dL   HCT 37.8 36.0 - 46.0 %   MCV 84.4 78.0 - 100.0 fL   MCH 28.6 26.0 - 34.0 pg   MCHC 33.9 30.0 - 36.0 g/dL   RDW 14.6 11.5 - 15.5 %   Platelets 277 150 - 400 K/uL  Rapid urine drug screen (hospital performed)     Status: Abnormal   Collection Time: 02/20/17  3:40 AM  Result Value Ref Range   Opiates NONE DETECTED NONE DETECTED   Cocaine NONE DETECTED NONE DETECTED   Benzodiazepines NONE DETECTED NONE DETECTED   Amphetamines NONE DETECTED NONE DETECTED   Tetrahydrocannabinol POSITIVE (A) NONE DETECTED   Barbiturates  NONE DETECTED NONE DETECTED    Comment:        DRUG SCREEN FOR MEDICAL PURPOSES ONLY.  IF CONFIRMATION IS NEEDED FOR ANY PURPOSE, NOTIFY LAB WITHIN 5 DAYS.        LOWEST DETECTABLE LIMITS FOR URINE DRUG SCREEN Drug Class       Cutoff (ng/mL) Amphetamine      1000 Barbiturate      200 Benzodiazepine   353 Tricyclics       614 Opiates          300 Cocaine          300 THC              50   Pregnancy, urine     Status: None   Collection Time: 02/20/17  4:56 AM  Result Value Ref Range   Preg Test, Ur NEGATIVE NEGATIVE    Comment:        THE SENSITIVITY OF THIS METHODOLOGY IS >20 mIU/mL.   CBG monitoring, ED     Status: Abnormal   Collection Time: 02/20/17  4:58 AM  Result Value Ref Range   Glucose-Capillary 111 (H) 65 - 99 mg/dL  Protime-INR     Status: Abnormal   Collection Time: 02/20/17  7:31 AM  Result Value Ref Range   Prothrombin Time 15.3 (H) 11.4 - 15.2 seconds   INR 1.20   Lactic acid, plasma     Status: None   Collection Time: 02/20/17  7:31 AM  Result Value Ref Range   Lactic Acid, Venous 1.6 0.5 - 1.9 mmol/L  Acetaminophen level     Status: Abnormal   Collection Time: 02/20/17  7:31 AM  Result Value Ref Range   Acetaminophen (Tylenol), Serum 178 (HH) 10 - 30 ug/mL    Comment:        THERAPEUTIC CONCENTRATIONS VARY SIGNIFICANTLY. A RANGE OF 10-30 ug/mL MAY BE AN EFFECTIVE CONCENTRATION FOR MANY  PATIENTS. HOWEVER, SOME ARE BEST TREATED AT CONCENTRATIONS OUTSIDE THIS RANGE. ACETAMINOPHEN CONCENTRATIONS >150 ug/mL AT 4 HOURS AFTER INGESTION AND >50 ug/mL AT 12 HOURS AFTER INGESTION ARE OFTEN ASSOCIATED WITH TOXIC REACTIONS. CRITICAL RESULT CALLED TO, READ BACK BY AND VERIFIED WITH: E.HOWELL,RN 0836 02/20/17 CLARK,S   Comprehensive metabolic panel     Status: Abnormal   Collection Time: 02/20/17  7:48 AM  Result Value Ref Range   Sodium 138 135 - 145 mmol/L   Potassium 4.0 3.5 - 5.1 mmol/L   Chloride 105 101 - 111 mmol/L   CO2 24 22 - 32 mmol/L   Glucose, Bld 102 (H) 65 - 99 mg/dL   BUN 7 6 - 20 mg/dL   Creatinine, Ser 0.82 0.44 - 1.00 mg/dL   Calcium 9.4 8.9 - 10.3 mg/dL   Total Protein 6.6 6.5 - 8.1 g/dL   Albumin 4.1 3.5 - 5.0 g/dL   AST 30 15 - 41 U/L   ALT 21 14 - 54 U/L   Alkaline Phosphatase 64 38 - 126 U/L   Total Bilirubin 0.9 0.3 - 1.2 mg/dL   GFR calc non Af Amer >60 >60 mL/min   GFR calc Af Amer >60 >60 mL/min    Comment: (NOTE) The eGFR has been calculated using the CKD EPI equation. This calculation has not been validated in all clinical situations. eGFR's persistently <60 mL/min signify possible Chronic Kidney Disease.    Anion gap 9 5 - 15    Current Facility-Administered Medications  Medication Dose Route Frequency Provider Last Rate Last  Dose  . 0.9 %  sodium chloride infusion   Intravenous Continuous Waldemar Dickens, MD 75 mL/hr at 02/20/17 1017    . acetylcysteine (ACETADOTE) 30,000 mg in dextrose 5 % 750 mL (40 mg/mL) infusion  15 mg/kg/hr Intravenous Continuous Waldemar Dickens, MD 28.1 mL/hr at 02/20/17 0851 15 mg/kg/hr at 02/20/17 0851  . acetylcysteine (MUCOMYST) 20 % nebulizer / oral solution 5,240 mg  70 mg/kg Oral Q4H Ripley Fraise, MD      . enoxaparin (LOVENOX) injection 40 mg  40 mg Subcutaneous Q24H Waldemar Dickens, MD   40 mg at 02/20/17 1019  . hydrALAZINE (APRESOLINE) injection 5-10 mg  5-10 mg Intravenous Q4H PRN Waldemar Dickens,  MD      . LORazepam (ATIVAN) injection 0.5 mg  0.5 mg Intravenous Q6H PRN Waldemar Dickens, MD      . metoCLOPramide [facility]) injection 10 mg  10 mg Intravenous Once Waldemar Dickens, MD   Stopped at 02/20/17 [phone]  . ondansetron (ZOFRAN-ODT) disintegrating tablet 4 mg  4 mg Oral Q8H PRN Waldemar Dickens, MD       Current Outpatient Prescriptions  Medication Sig Dispense Refill  . ibuprofen (ADVIL,MOTRIN) 800 MG tablet Take 1 tablet (800 mg total) by mouth 3 (three) times daily. 21 tablet 0  . methocarbamol (ROBAXIN) 500 MG tablet Take 1 tablet (500 mg total) by mouth 2 (two) times daily. 20 tablet 0  . naproxen (NAPROSYN) 500 MG tablet Take 1 tablet (500 mg total) by mouth 2 (two) times daily. 30 tablet 0    Musculoskeletal: Strength & Muscle Tone: decreased Gait & Station: unable to stand Patient leans: N/A  Psychiatric Specialty Exam: Physical Exam as per history and physical   ROS patient is poorly cooperative during this evaluation. Patient does not appear to be responding to internal stimuli but she is withdrawn and nodding her head instead of talking.   Blood pressure 106/73, pulse 65, temperature 98 F (36.7 C), temperature source Oral, resp. rate 18, last menstrual period 01/29/2017, SpO2 100 %.There is no height or weight on file to calculate BMI.  General Appearance: Disheveled and Guarded  Eye Contact:  Fair  Speech:  Slow  Volume:  Decreased  Mood:  Depressed  Affect:  Constricted and Depressed  Thought Process:  Coherent  Orientation:  Full (Time, Place, and Person)  Thought Content:  Logical  Suicidal Thoughts:  Yes.  with intent/plan  Homicidal Thoughts:  No  Memory:  Immediate;   Fair Recent;   Fair Remote;   Fair  Judgement:  Poor  Insight:  Fair  Psychomotor Activity:  Decreased  Concentration:  Concentration: Fair and Attention Span: Fair  Recall:  [employer] of Knowledge:  Fair  Language:  Good  Akathisia:  Negative  Handed:  Right  AIMS (if indicated):      Assets:  Communication Skills Desire for Improvement Financial Resources/Insurance Housing Leisure Time Resilience Social Support  ADL's:  Impaired  Cognition:  WNL  Sleep:        Treatment Plan Summary: 26 years old female admitted for increased symptoms of depressive disorder, abdominal pain, status post suicidal attempt by intentional drug overdose. Patient is a poor historian and needed further visit for detailed or comprehensive psychiatric evaluation. Patient endorsed suicidal intent and intentional overdose.  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 Recommended no psychotropic medication until patient is medically cleared Daily contact with patient to assess and evaluate symptoms and progress in treatment and Medication management  Appreciate psychiatric consultation and follow up as clinically required Please contact [phone] or [phone] if needs further assistance  Disposition: Recommend psychiatric Inpatient admission when medically cleared. Supportive therapy provided about ongoing stressors.  Ambrose Finland, MD 02/20/2017 11:23 AM

## 2017-02-20 NOTE — ED Notes (Signed)
Pt. refused blood specimen collection for her PT / INR , EDP notified.

## 2017-02-20 NOTE — ED Notes (Signed)
Dr. Christy Gentles at beside explaining plan of care to pt.

## 2017-02-20 NOTE — ED Notes (Signed)
Dr.Wickline aware of tylenol level

## 2017-02-20 NOTE — ED Notes (Signed)
Contacted poison control; recommends ekg, tylenol  Level q 4 hours after ingestion. If tylenol level is greater than 150, then will need treatment. Poison control will follow up

## 2017-02-20 NOTE — ED Notes (Signed)
Updated poison control at this time. 

## 2017-02-20 NOTE — ED Notes (Signed)
Pt. refused peripheral IV .

## 2017-02-20 NOTE — ED Triage Notes (Signed)
Pt to ED voluntarily by [facility] c/o suicide attempt by taking 2 bottles (50x 500 mg of tylenol) and 1 bottle of nyquil prior to arrival. Pt c/o stomach pain with nausea and back pain.

## 2017-02-20 NOTE — ED Notes (Signed)
Pt. refused to take Acetylcysteine and requesting female nurse and female MD , Dr. Christy Gentles notified .

## 2017-02-20 NOTE — Progress Notes (Signed)
MEDICATION RELATED CONSULT NOTE - INITIAL   Pharmacy Consult for po acetylcysteine Indication: Tylenol overdose  Allergies  Allergen Reactions  . Peanut-Containing Drug Products Anaphylaxis and Hives  . Food Hives, Itching and Swelling    PEANUTS    Patient Measurements:    Vital Signs: Temp: 98 F (36.7 C) (04/04 0313) Temp Source: Oral (04/04 0313) BP: 152/100 (04/04 0313) Pulse Rate: 72 (04/04 0313) Intake/Output from previous day: No intake/output data recorded. Intake/Output from this shift: No intake/output data recorded.  Labs:  Recent Labs  02/20/17 0320  WBC 7.5  HGB 12.8  HCT 37.8  PLT 277  CREATININE 0.79  ALBUMIN 4.3  PROT 6.7  AST 25  ALT 17  ALKPHOS 62  BILITOT 0.6   Estimated Creatinine Clearance: 118.1 mL/min (by C-G formula based on SCr of 0.79 mg/dL).   Microbiology: No results found for this or any previous visit (from the past 720 hour(s)).  Medical History: Past Medical History:  Diagnosis Date  . Depression   . Otitis externa due to Pseudomonas aeruginosa 04/2014    Medications:  Awaiting electronic med rec  Assessment: 26 y.o. F presents after attempting suicide by taking 2 bottles (50 x 500mg ) Tylenol.  Baseline labs: APAP 808, salicylate <7, AST 25, ALT 17  [facility] [phone]) called by ED staff - recommended po acetylcysteine**  Goal of Therapy:  a. Minimum of 6 doses of oral NAC or 24 hours of IV NAC have been administered b. Patient is asymptomatic [place] Acetaminophen Overdose Protocol 19 February 2012 c. AST and ALT normal d. Serum bicarbonate ? 20 or pH ? 7.25 e. INR < 2 (if required) f. Acetaminophen level ? 10 mcg/mL (if required)  Plan:  Acetylcysteine 20% 140 mg/kg oral load followed by 70mg /kg q4h Will f/u repeat APAP level and CMET 22 hours post oral NAC started  Sherlon Handing, PharmD, BCPS Clinical pharmacist, pager [phone] 02/20/2017,4:50 AM
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
## 2017-02-20 NOTE — ED Notes (Signed)
Pt. Vomiting. Pt. c/o centralized chest pain at this time. EKG taken.

## 2017-02-21 DIAGNOSIS — R1010 Upper abdominal pain, unspecified: Secondary | ICD-10-CM

## 2017-02-21 LAB — URINALYSIS, ROUTINE W REFLEX MICROSCOPIC
Bilirubin Urine: NEGATIVE
Glucose, UA: NEGATIVE mg/dL
HGB URINE DIPSTICK: NEGATIVE
KETONES UR: NEGATIVE mg/dL
LEUKOCYTES UA: NEGATIVE
Nitrite: NEGATIVE
PROTEIN: NEGATIVE mg/dL
Specific Gravity, Urine: 1.012 (ref 1.005–1.030)
pH: 5 (ref 5.0–8.0)

## 2017-02-21 LAB — COMPREHENSIVE METABOLIC PANEL
ALT: 35 U/L (ref 14–54)
ANION GAP: 8 (ref 5–15)
AST: 32 U/L (ref 15–41)
Albumin: 3.2 g/dL — ABNORMAL LOW (ref 3.5–5.0)
Alkaline Phosphatase: 48 U/L (ref 38–126)
BUN: 7 mg/dL (ref 6–20)
CHLORIDE: 104 mmol/L (ref 101–111)
CO2: 24 mmol/L (ref 22–32)
Calcium: 8.7 mg/dL — ABNORMAL LOW (ref 8.9–10.3)
Creatinine, Ser: 0.8 mg/dL (ref 0.44–1.00)
Glucose, Bld: 89 mg/dL (ref 65–99)
Potassium: 3.7 mmol/L (ref 3.5–5.1)
SODIUM: 136 mmol/L (ref 135–145)
Total Bilirubin: 1.1 mg/dL (ref 0.3–1.2)
Total Protein: 5.3 g/dL — ABNORMAL LOW (ref 6.5–8.1)

## 2017-02-21 LAB — LIPASE, BLOOD: LIPASE: 17 U/L (ref 11–51)

## 2017-02-21 LAB — ACETAMINOPHEN LEVEL

## 2017-02-21 LAB — MAGNESIUM: Magnesium: 1.8 mg/dL (ref 1.7–2.4)

## 2017-02-21 LAB — PHOSPHORUS: Phosphorus: 3 mg/dL (ref 2.5–4.6)

## 2017-02-21 LAB — HIV ANTIBODY (ROUTINE TESTING W REFLEX): HIV SCREEN 4TH GENERATION: NONREACTIVE

## 2017-02-21 MED ORDER — DIPHENHYDRAMINE HCL 25 MG PO CAPS
25.0000 mg | ORAL_CAPSULE | Freq: Three times a day (TID) | ORAL | Status: DC | PRN
  Administered 2017-02-21: 25 mg via ORAL
  Filled 2017-02-21 (×2): qty 1

## 2017-02-21 NOTE — Progress Notes (Signed)
CSW received consult for psychiatric inpatient placement when pt is medically stable for DC.  Per MD consult likely stable on 4/6- patient will need to be documented as medically stable prior to referrals being sent out  CSW will continue to follow and begin referral process when pt is medically ready  Jorge Ny, [place] Social Worker [phone]

## 2017-02-21 NOTE — Progress Notes (Signed)
Patient has scheduled acetaminophen lab draws and one was scheduled for 2218 on 02/20/17. This lab draw did not occur and is still active. RN called lab and spoke with [place]. She stated that she had just gotten off the phone with pharmacy who told her that this lab draw had been missed and that it was very important to be completed this morning. Tiana informed me that a phlebotomist would be back this morning to draw this patient's acetaminophen level since it had not been drawn at the correct time last night. This RN also spoke with [employer] this morning asking about the patient's acetaminophen level. RN informed [employer] that the patient's lab results would be back later this morning and he said he would call back later.

## 2017-02-21 NOTE — Progress Notes (Addendum)
[facility] PROGRESS NOTE  Maria Sampson TKZ:[id number] DOB: 1991-05-26 DOA: 02/20/2017   PCP: Pcp Not In System     Assessment/Plan: Principal Problem:   Tylenol overdose, intentional self-harm, initial encounter [facility]) Active Problems:   Depressive disorder   Abdominal pain   Suicide attempt   Mild tetrahydrocannabinol (THC) abuse   Essential hypertension   26 y.o. female with medical history significant of depression presenting after admitting to ingesting 2 bottles of tylenol and 1 bottle of Nyquil.   admitted for suicide attempt. Currently receiving Mucomyst per protocol  Assessment and plan  Suicide attempt: pt unwilling to discuss why she attempted suicide at this time. Healed cut marks present on L arm. Previous OD/suicide attempt noted in chart from 2015   [facility]  Transfer 4/6 - [facility]  Will need inpatient psychiatric admission Tylenol Overdose: pt took 2 bottles of tylenol.   Initial Tylenol Level 210 , now < 10 Status post receiving 6 doses of oral NAC/ 24 hours of IV NAC have been administered AST and ALT normal Serum bicarbonate ? 20   INR < 2 (if required) Acetaminophen level ? 10 mcg/mL Anticipate patient will be medically stable for discharge to [facility]  4/6  Abdominal pain Patient has been able to eat all day LFT's ok Will check lipase and UA If persists ,may need CT abdomen /pelvis  Polysubstance abuse: THC + on UDS - cessation counseling needed - CSW.  HTN: likely from pain, anxiety and agitation - Hydralazine prn    DVT prophylaxsis Lovenox  Code Status:  Full code      Family Communication: Discussed in detail with the patient, all imaging results, lab results explained to the patient   Disposition Plan:  Continue current treatment, psychiatric  recommends inpatient psychiatric treatment      Consultants Psychiatry  Procedures:  None  Antibiotics: Anti-infectives    None         HPI/Subjective: Complains of  itching , no N,V,AP  Objective: Vitals:   02/20/17 1442 02/20/17 2144 02/20/17 2145 02/21/17 0538  BP: 123/79 125/83 125/83 132/73  Pulse: 61 (!) 52 (!) 52 (!) 58  Resp: 20  18 16   Temp: 98.6 F (37 C)  98.1 F (36.7 C) 98.8 F (37.1 C)  TempSrc: Oral  Oral Oral  SpO2: 100% 100% 100% 100%  Weight: 71.4 kg (157 lb 6.5 oz)     Height: 5\' 11"  (1.803 m)       Intake/Output Summary (Last 24 hours) at 02/21/17 0919 Last data filed at 02/21/17 5732  Gross per 24 hour  Intake          3146.57 ml  Output                0 ml  Net          3146.57 ml    Exam:  Examination:  General exam: Appears calm and comfortable  Respiratory system: Clear to auscultation. Respiratory effort normal. Cardiovascular system: S1 & S2 heard, RRR. No JVD, murmurs, rubs, gallops or clicks. No pedal edema. Gastrointestinal system: Abdomen is nondistended, soft and nontender. No organomegaly or masses felt. Normal bowel sounds heard. Central nervous system: Alert and oriented. No focal neurological deficits. Extremities: Symmetric 5 x 5 power. Skin: No rashes, lesions or ulcers Psychiatry: Judgement and insight appear normal. Mood & affect appropriate.     Data Reviewed: I have personally reviewed following labs and imaging studies  Micro Results  No results found for this or any previous visit (from the past 240 hour(s)).  Radiology Reports No results found.   CBC  Recent Labs Lab 02/20/17 0320  WBC 7.5  HGB 12.8  HCT 37.8  PLT 277  MCV 84.4  MCH 28.6  MCHC 33.9  RDW 14.6    Chemistries   Recent Labs Lab 02/20/17 0320 02/20/17 0748 02/21/17 0456  NA 139 138 136  K 3.6 4.0 3.7  CL 105 105 104  CO2 24 24 24   GLUCOSE 114* 102* 89  BUN 6 7 7   CREATININE 0.79 0.82 0.80  CALCIUM 9.4 9.4 8.7*  MG  --   --  1.8  AST 25 30 32  ALT 17 21 35  ALKPHOS 62 64 48  BILITOT 0.6 0.9 1.1    ------------------------------------------------------------------------------------------------------------------ estimated creatinine clearance is 118.1 mL/min (by C-G formula based on SCr of 0.8 mg/dL). ------------------------------------------------------------------------------------------------------------------ No results for input(s): HGBA1C in the last 72 hours. ------------------------------------------------------------------------------------------------------------------ No results for input(s): CHOL, HDL, LDLCALC, TRIG, CHOLHDL, LDLDIRECT in the last 72 hours. ------------------------------------------------------------------------------------------------------------------ No results for input(s): TSH, T4TOTAL, T3FREE, THYROIDAB in the last 72 hours.  Invalid input(s): FREET3 ------------------------------------------------------------------------------------------------------------------ No results for input(s): VITAMINB12, FOLATE, FERRITIN, TIBC, IRON, RETICCTPCT in the last 72 hours.  Coagulation profile  Recent Labs Lab 02/20/17 0731  INR 1.20    No results for input(s): DDIMER in the last 72 hours.  Cardiac Enzymes No results for input(s): CKMB, TROPONINI, MYOGLOBIN in the last 168 hours.  Invalid input(s): CK ------------------------------------------------------------------------------------------------------------------ Invalid input(s): POCBNP   CBG:  Recent Labs Lab 02/20/17 0458  GLUCAP 111*       Studies: No results found.    No results found for: HGBA1C Lab Results  Component Value Date   CREATININE 0.80 02/21/2017       Scheduled Meds: . enoxaparin (LOVENOX) injection  40 mg Subcutaneous Q24H  . metoCLOPramide (REGLAN) injection  10 mg Intravenous Once   Continuous Infusions: . sodium chloride 75 mL/hr at 02/20/17 1017     LOS: 1 day    Time spent: >30 MINS    [facility]  [facility] Pager [phone]. If  7PM-7AM, please contact night-coverage at [email or web address], password [email or web address] 02/21/2017, 9:19 AM  LOS: 1 day

## 2017-02-21 NOTE — Progress Notes (Signed)
MEDICATION RELATED CONSULT NOTE - Follow-up  Pharmacy Consult for IV acetylcysteine Indication: Tylenol overdose  Allergies  Allergen Reactions  . Peanut-Containing Drug Products Anaphylaxis and Hives  . Food Hives, Itching and Swelling    PEANUTS    Patient Measurements: Height: 5\' 11"  (180.3 cm) Weight: 157 lb 6.5 oz (71.4 kg) IBW/kg (Calculated) : 70.8  Vital Signs: Temp: 98.8 F (37.1 C) (04/05 0538) Temp Source: Oral (04/05 0538) BP: 132/73 (04/05 0538) Pulse Rate: 58 (04/05 0538) Intake/Output from previous day: 04/04 0701 - 04/05 0700 In: 2606.6 [P.O.:1080; I.V.:1526.6] Out: 0  Intake/Output from this shift: Total I/O In: 240 [P.O.:240] Out: -   Labs:  Recent Labs  02/20/17 0320 02/20/17 0748 02/21/17 0456  WBC 7.5  --   --   HGB 12.8  --   --   HCT 37.8  --   --   PLT 277  --   --   CREATININE 0.79 0.82 0.80  MG  --   --  1.8  PHOS  --   --  3.0  ALBUMIN 4.3 4.1 3.2*  PROT 6.7 6.6 5.3*  AST 25 30 32  ALT 17 21 35  ALKPHOS 62 64 48  BILITOT 0.6 0.9 1.1   Estimated Creatinine Clearance: 118.1 mL/min (by C-G formula based on SCr of 0.8 mg/dL).   Microbiology: No results found for this or any previous visit (from the past 720 hour(s)).  Assessment: 26 y.o. F presents after attempting suicide by taking 2 bottles (50 x 500mg ) Tylenol.  Baseline labs 4/4 0320: APAP 482, salicylate <7, AST 25, ALT 17 4/4 0730: APAP 178, AST 30, ALT 21, INR 1.2 4/5: APAP < 10, AST 32, ALT 35, bicarb [place] [phone]) with updated labs and they recommend discontinuing IV acetylcysteine at this time.  Goal of Therapy:  a. Minimum of 6 doses of oral NAC or 24 hours of IV NAC have been administered b. Patient is asymptomatic [facility] Acetaminophen Overdose Protocol 19 February 2012 c. AST and ALT normal d. Serum bicarbonate ? 20 or pH ? 7.25 e. INR < 2 (if required) f. Acetaminophen level ? 10 mcg/mL (if required)  Plan:  Will call  on-call NP and recommend d/c IV acetylcysteine as per [place], PharmD, BCPS Clinical pharmacist, pager [phone] 02/21/2017,6:30 AM

## 2017-02-22 ENCOUNTER — Inpatient Hospital Stay (HOSPITAL_COMMUNITY): Payer: Self-pay

## 2017-02-22 DIAGNOSIS — R7989 Other specified abnormal findings of blood chemistry: Secondary | ICD-10-CM

## 2017-02-22 DIAGNOSIS — F1210 Cannabis abuse, uncomplicated: Secondary | ICD-10-CM

## 2017-02-22 DIAGNOSIS — R1011 Right upper quadrant pain: Secondary | ICD-10-CM

## 2017-02-22 DIAGNOSIS — T391X2A Poisoning by 4-Aminophenol derivatives, intentional self-harm, initial encounter: Principal | ICD-10-CM

## 2017-02-22 DIAGNOSIS — I10 Essential (primary) hypertension: Secondary | ICD-10-CM

## 2017-02-22 LAB — COMPREHENSIVE METABOLIC PANEL
ALT: 110 U/L — AB (ref 14–54)
ANION GAP: 7 (ref 5–15)
AST: 112 U/L — ABNORMAL HIGH (ref 15–41)
Albumin: 3.4 g/dL — ABNORMAL LOW (ref 3.5–5.0)
Alkaline Phosphatase: 50 U/L (ref 38–126)
BUN: 7 mg/dL (ref 6–20)
CHLORIDE: 104 mmol/L (ref 101–111)
CO2: 26 mmol/L (ref 22–32)
Calcium: 9 mg/dL (ref 8.9–10.3)
Creatinine, Ser: 0.89 mg/dL (ref 0.44–1.00)
GFR calc non Af Amer: >60 mL/min (ref >60)
Glucose, Bld: 82 mg/dL (ref 65–99)
POTASSIUM: 3.9 mmol/L (ref 3.5–5.1)
SODIUM: 137 mmol/L (ref 135–145)
Total Bilirubin: 0.8 mg/dL (ref 0.3–1.2)
Total Protein: 5.7 g/dL — ABNORMAL LOW (ref 6.5–8.1)

## 2017-02-22 LAB — PROTIME-INR
INR: 1.34
Prothrombin Time: 16.6 seconds — ABNORMAL HIGH (ref 11.4–15.2)

## 2017-02-22 MED ORDER — DIPHENHYDRAMINE-ZINC ACETATE 2-0.1 % EX CREA
TOPICAL_CREAM | Freq: Every day | CUTANEOUS | Status: DC | PRN
  Administered 2017-02-22: 22:00:00 via TOPICAL
  Filled 2017-02-22: qty 28

## 2017-02-22 MED ORDER — TRAMADOL HCL 50 MG PO TABS
50.0000 mg | ORAL_TABLET | Freq: Four times a day (QID) | ORAL | Status: DC | PRN
  Filled 2017-02-22: qty 1

## 2017-02-22 NOTE — Progress Notes (Signed)
PROGRESS NOTE    Maria Sampson  OVZ:[id number] DOB: 07-Nov-1991 DOA: 02/20/2017 PCP: Pcp Not In System   Brief Narrative: Maria Sampson is a 26 y.o. female with a history of depression. She presented after a suicide attempt and Tylenol overdose.   Assessment & Plan:   Principal Problem:   Tylenol overdose, intentional self-harm, initial encounter [facility]) Active Problems:   Depressive disorder   Abdominal pain   Suicide attempt   Mild tetrahydrocannabinol (THC) abuse   Essential hypertension   Suicide attempt Tylenol overdose Tylenol level undetectable. Medically stable from this standpoint -[facility] transfer  Abdominal pain Elevated transaminases Persistent, sharp. Radiates. No trauma. Liver function elevated today. Constipation -KUB -abdominal ultrasound   DVT prophylaxis: Lovenox Code Status: Full code Family Communication: None at bedside Disposition Plan: Discharge to behavioral health when medically stable   Consultants:   Psychiatry  Procedures:   None  Antimicrobials:  None    Subjective: RUQ sharp pain that radiates to back with some nausea and no vomiting. Nothing has helped with her pain. Has worsened since yesterday.  Objective: Vitals:   02/20/17 2145 02/21/17 0538 02/21/17 2127 02/22/17 0650  BP: 125/83 132/73 124/72 115/72  Pulse: (!) 52 (!) 58  (!) 50  Resp: 18 16    Temp: 98.1 F (36.7 C) 98.8 F (37.1 C) 98.8 F (37.1 C) 98.2 F (36.8 C)  TempSrc: Oral Oral Oral Oral  SpO2: 100% 100% 100% 100%  Weight:      Height:        Intake/Output Summary (Last 24 hours) at 02/22/17 1211 Last data filed at 02/22/17 0950  Gross per 24 hour  Intake              480 ml  Output                0 ml  Net              480 ml   Filed Weights   02/20/17 1442  Weight: 71.4 kg (157 lb 6.5 oz)    Examination:  General exam: Appears calm and comfortable Respiratory system: Clear to auscultation. Respiratory effort  normal. Cardiovascular system: S1 & S2 heard, RRR. No murmurs, rubs, gallops or clicks. Gastrointestinal system: Abdomen is nondistended, soft and mildly tender in RUQ. Normal bowel sounds heard. Central nervous system: Alert and oriented. No focal neurological deficits. Extremities: No edema. No calf tenderness Skin: No cyanosis. No rashes Psychiatry: Judgement and insight appear poor. Mood & affect flat and depressed.     Data Reviewed: I have personally reviewed following labs and imaging studies  CBC:  Recent Labs Lab 02/20/17 0320  WBC 7.5  HGB 12.8  HCT 37.8  MCV 84.4  PLT 412   Basic Metabolic Panel:  Recent Labs Lab 02/20/17 0320 02/20/17 0748 02/21/17 0456 02/22/17 0537  NA 139 138 136 137  K 3.6 4.0 3.7 3.9  CL 105 105 104 104  CO2 24 24 24 26   GLUCOSE 114* 102* 89 82  BUN 6 7 7 7   CREATININE 0.79 0.82 0.80 0.89  CALCIUM 9.4 9.4 8.7* 9.0  MG  --   --  1.8  --   PHOS  --   --  3.0  --    GFR: Estimated Creatinine Clearance: 106.1 mL/min (by C-G formula based on SCr of 0.89 mg/dL). Liver Function Tests:  Recent Labs Lab 02/20/17 0320 02/20/17 0748 02/21/17 0456 02/22/17 0537  AST 25 30 32 112*  ALT 17 21 35 110*  ALKPHOS 62 64 48 50  BILITOT 0.6 0.9 1.1 0.8  PROT 6.7 6.6 5.3* 5.7*  ALBUMIN 4.3 4.1 3.2* 3.4*    Recent Labs Lab 02/21/17 2019  LIPASE 17   No results for input(s): AMMONIA in the last 168 hours. Coagulation Profile:  Recent Labs Lab 02/20/17 0731 02/22/17 0537  INR 1.20 1.34   Cardiac Enzymes: No results for input(s): CKTOTAL, CKMB, CKMBINDEX, TROPONINI in the last 168 hours. BNP (last 3 results) No results for input(s): PROBNP in the last 8760 hours. HbA1C: No results for input(s): HGBA1C in the last 72 hours. CBG:  Recent Labs Lab 02/20/17 0458  GLUCAP 111*   Lipid Profile: No results for input(s): CHOL, HDL, LDLCALC, TRIG, CHOLHDL, LDLDIRECT in the last 72 hours. Thyroid Function Tests: No results for  input(s): TSH, T4TOTAL, FREET4, T3FREE, THYROIDAB in the last 72 hours. Anemia Panel: No results for input(s): VITAMINB12, FOLATE, FERRITIN, TIBC, IRON, RETICCTPCT in the last 72 hours. Sepsis Labs:  Recent Labs Lab 02/20/17 0731  LATICACIDVEN 1.6    No results found for this or any previous visit (from the past 240 hour(s)).       Radiology Studies: No results found.      Scheduled Meds: . enoxaparin (LOVENOX) injection  40 mg Subcutaneous Q24H  . metoCLOPramide (REGLAN) injection  10 mg Intravenous Once   Continuous Infusions:   LOS: 2 days     Cordelia Poche, MD [facility] 02/22/2017, 12:11 PM Pager: [phone]  If 7PM-7AM, please contact night-coverage [email or web address] Password [email or web address] 02/22/2017, 12:11 PM

## 2017-02-23 LAB — COMPREHENSIVE METABOLIC PANEL
ALT: 141 U/L — ABNORMAL HIGH (ref 14–54)
AST: 83 U/L — ABNORMAL HIGH (ref 15–41)
Albumin: 3.8 g/dL (ref 3.5–5.0)
Alkaline Phosphatase: 50 U/L (ref 38–126)
Anion gap: 8 (ref 5–15)
BILIRUBIN TOTAL: 0.7 mg/dL (ref 0.3–1.2)
BUN: 7 mg/dL (ref 6–20)
CALCIUM: 9.2 mg/dL (ref 8.9–10.3)
CO2: 27 mmol/L (ref 22–32)
Chloride: 101 mmol/L (ref 101–111)
Creatinine, Ser: 0.88 mg/dL (ref 0.44–1.00)
GFR calc non Af Amer: >60 mL/min (ref >60)
Glucose, Bld: 91 mg/dL (ref 65–99)
Potassium: 3.8 mmol/L (ref 3.5–5.1)
Sodium: 136 mmol/L (ref 135–145)
TOTAL PROTEIN: 6.4 g/dL — AB (ref 6.5–8.1)

## 2017-02-23 MED ORDER — DIPHENHYDRAMINE HCL 25 MG PO CAPS
50.0000 mg | ORAL_CAPSULE | Freq: Once | ORAL | Status: AC
  Administered 2017-02-23: 50 mg via ORAL
  Filled 2017-02-23: qty 2

## 2017-02-23 NOTE — Progress Notes (Signed)
PROGRESS NOTE    Maria Sampson  TJQ:[id number] DOB: 06/22/1991 DOA: 02/20/2017 PCP: Pcp Not In System   Brief Narrative: Maria Sampson is a 26 y.o. female with a history of depression. She presented after a suicide attempt and Tylenol overdose.   Assessment & Plan:   Principal Problem:   Tylenol overdose, intentional self-harm, initial encounter [place]) Active Problems:   Depressive disorder   Abdominal pain   Suicide attempt   Mild tetrahydrocannabinol (THC) abuse   Essential hypertension   Suicide attempt Tylenol overdose Tylenol level undetectable. Medically stable from this standpoint -[facility] transfer  Abdominal pain Elevated transaminases Pain improved. No stool on KUB. Normal RUQ. ALT still rising slightly. Secondary to Tylenol overdose.   DVT prophylaxis: Lovenox Code Status: Full code Family Communication: None at bedside Disposition Plan: Discharge to behavioral health when medically stable likely tomorrow   Consultants:   Psychiatry  Procedures:   None  Antimicrobials:  None    Subjective: Pain improved. No nausea or vomiting. No chest pain.  Objective: Vitals:   02/22/17 0650 02/22/17 1357 02/22/17 2137 02/23/17 0617  BP: 115/72 (!) 110/59 125/71 114/60  Pulse: (!) 50 (!) 46 67 68  Resp:  19 17 17   Temp: 98.2 F (36.8 C) 98.3 F (36.8 C) 97.6 F (36.4 C) 98.3 F (36.8 C)  TempSrc: Oral  Oral Oral  SpO2: 100% 100% 100% 100%  Weight:      Height:        Intake/Output Summary (Last 24 hours) at 02/23/17 1254 Last data filed at 02/23/17 0203  Gross per 24 hour  Intake              480 ml  Output                0 ml  Net              480 ml   Filed Weights   02/20/17 1442  Weight: 71.4 kg (157 lb 6.5 oz)    Examination:  General exam: Appears calm and comfortable Respiratory system: Clear to auscultation. Respiratory effort normal. Cardiovascular system: S1 & S2 heard, RRR. No murmurs, rubs, gallops or  clicks. Gastrointestinal system: Abdomen is nondistended, soft and mildly tender in RUQ. Normal bowel sounds heard. Central nervous system: Alert and oriented. No focal neurological deficits. Extremities: No edema. No calf tenderness Skin: No cyanosis. No rashes Psychiatry: Judgement and insight appear poor. Mood & affect flat and depressed.     Data Reviewed: I have personally reviewed following labs and imaging studies  CBC:  Recent Labs Lab 02/20/17 0320  WBC 7.5  HGB 12.8  HCT 37.8  MCV 84.4  PLT 762   Basic Metabolic Panel:  Recent Labs Lab 02/20/17 0320 02/20/17 0748 02/21/17 0456 02/22/17 0537 02/23/17 0826  NA 139 138 136 137 136  K 3.6 4.0 3.7 3.9 3.8  CL 105 105 104 104 101  CO2 24 24 24 26 27   GLUCOSE 114* 102* 89 82 91  BUN 6 7 7 7 7   CREATININE 0.79 0.82 0.80 0.89 0.88  CALCIUM 9.4 9.4 8.7* 9.0 9.2  MG  --   --  1.8  --   --   PHOS  --   --  3.0  --   --    GFR: Estimated Creatinine Clearance: 107.3 mL/min (by C-G formula based on SCr of 0.88 mg/dL). Liver Function Tests:  Recent Labs Lab 02/20/17 0320 02/20/17 0748 02/21/17 0456 02/22/17  0537 02/23/17 0826  AST 25 30 32 112* 83*  ALT 17 21 35 110* 141*  ALKPHOS 62 64 48 50 50  BILITOT 0.6 0.9 1.1 0.8 0.7  PROT 6.7 6.6 5.3* 5.7* 6.4*  ALBUMIN 4.3 4.1 3.2* 3.4* 3.8    Recent Labs Lab 02/21/17 2019  LIPASE 17   No results for input(s): AMMONIA in the last 168 hours. Coagulation Profile:  Recent Labs Lab 02/20/17 0731 02/22/17 0537  INR 1.20 1.34   Cardiac Enzymes: No results for input(s): CKTOTAL, CKMB, CKMBINDEX, TROPONINI in the last 168 hours. BNP (last 3 results) No results for input(s): PROBNP in the last 8760 hours. HbA1C: No results for input(s): HGBA1C in the last 72 hours. CBG:  Recent Labs Lab 02/20/17 0458  GLUCAP 111*   Lipid Profile: No results for input(s): CHOL, HDL, LDLCALC, TRIG, CHOLHDL, LDLDIRECT in the last 72 hours. Thyroid Function Tests: No  results for input(s): TSH, T4TOTAL, FREET4, T3FREE, THYROIDAB in the last 72 hours. Anemia Panel: No results for input(s): VITAMINB12, FOLATE, FERRITIN, TIBC, IRON, RETICCTPCT in the last 72 hours. Sepsis Labs:  Recent Labs Lab 02/20/17 0731  LATICACIDVEN 1.6    No results found for this or any previous visit (from the past 240 hour(s)).       Radiology Studies: Dg Abd Portable 1v  Result Date: 02/22/2017 CLINICAL DATA:  Abdominal pain and back pain over the last 2 days. EXAM: PORTABLE ABDOMEN - 1 VIEW COMPARISON:  10/30/2015 FINDINGS: Bowel gas pattern is normal without evidence of ileus, obstruction or visible free air. No abnormal calcifications or bone findings. Phleboliths in the pelvis. Bony structures appear unremarkable. IMPRESSION: Negative radiography. Electronically Signed   By: Nelson Chimes M.D.   On: 02/22/2017 12:35   US Abdomen Limited Ruq  Result Date: 02/22/2017 CLINICAL DATA:  Abdominal pain x2 days with elevated liver function tests. EXAM: US ABDOMEN LIMITED - RIGHT UPPER QUADRANT COMPARISON:  10/28/2015 CT FINDINGS: Gallbladder: No gallstones or wall thickening visualized. No sonographic Murphy sign noted by sonographer. Common bile duct: Diameter: 1.9 mm Liver: No focal lesion identified. Within normal limits in parenchymal echogenicity. IMPRESSION: No sonographic acute abnormality of the right upper quadrant of the abdomen. Electronically Signed   By: Ashley Royalty M.D.   On: 02/22/2017 19:41        Scheduled Meds: . enoxaparin (LOVENOX) injection  40 mg Subcutaneous Q24H  . metoCLOPramide (REGLAN) injection  10 mg Intravenous Once   Continuous Infusions:   LOS: 3 days     Cordelia Poche, MD [facility] 02/23/2017, 12:54 PM Pager: [phone]  If 7PM-7AM, please contact night-coverage [email or web address] Password [facility] 02/23/2017, 12:54 PM

## 2017-02-23 NOTE — Progress Notes (Signed)
MD on call notified that patient is insisting that if we want her to stay then we must involuntarily commit her.  MD came up and spoke to patient; she is now willing to stay the night after her conversation with the doctor.  Will continue to monitor the patient.

## 2017-02-24 DIAGNOSIS — R45851 Suicidal ideations: Secondary | ICD-10-CM

## 2017-02-24 DIAGNOSIS — F17210 Nicotine dependence, cigarettes, uncomplicated: Secondary | ICD-10-CM

## 2017-02-24 DIAGNOSIS — F329 Major depressive disorder, single episode, unspecified: Secondary | ICD-10-CM

## 2017-02-24 LAB — COMPREHENSIVE METABOLIC PANEL
ALT: 102 U/L — ABNORMAL HIGH (ref 14–54)
ANION GAP: 8 (ref 5–15)
AST: 40 U/L (ref 15–41)
Albumin: 3.3 g/dL — ABNORMAL LOW (ref 3.5–5.0)
Alkaline Phosphatase: 60 U/L (ref 38–126)
BILIRUBIN TOTAL: 0.5 mg/dL (ref 0.3–1.2)
BUN: 12 mg/dL (ref 6–20)
CHLORIDE: 102 mmol/L (ref 101–111)
CO2: 27 mmol/L (ref 22–32)
Calcium: 9.2 mg/dL (ref 8.9–10.3)
Creatinine, Ser: 0.85 mg/dL (ref 0.44–1.00)
GLUCOSE: 78 mg/dL (ref 65–99)
Potassium: 3.5 mmol/L (ref 3.5–5.1)
Sodium: 137 mmol/L (ref 135–145)
Total Protein: 6 g/dL — ABNORMAL LOW (ref 6.5–8.1)

## 2017-02-24 NOTE — Consult Note (Signed)
[facility] Face-to-Face Psychiatry Consult   Reason for Consult: suicide attempt by overdose on Tylenol. Referring Physician:  Dr. Lonny Prude Patient Identification: Maria Sampson MRN:  [id number] Principal Diagnosis: Tylenol overdose, intentional self-harm, initial encounter [facility]) Diagnosis:   Patient Active Problem List   Diagnosis Date Noted  . Suicide attempt [T14.91XA] 02/20/2017  . Tylenol overdose, intentional self-harm, initial encounter ([place]) [T39.1X2A] 02/20/2017  . Mild tetrahydrocannabinol (THC) abuse [F12.10] 02/20/2017  . BPV (benign positional vertigo) [H81.10] 10/27/2015  . Malnutrition of moderate degree [E44.0] 10/26/2015  . Abdominal pain [R10.9] 10/23/2015  . Nausea & vomiting [R11.2] 10/23/2015  . Sepsis ([place]) [A41.9] 10/23/2015  . Fibroid, uterine [D25.9] 10/23/2015  . Ileus ([place]) [K56.7] 10/23/2015  . Normocytic anemia [D64.9] 10/23/2015  . Overdose [T50.901A] 06/03/2014  . Depressive disorder [F32.9] 06/03/2014  . Suicidal ideation [R45.851] 06/03/2014  . Otitis externa due to Pseudomonas aeruginosa [B96.5, H62.40] 05/03/2014    Total Time spent with patient: 45 minutes  Subjective:   Maria Sampson is a 26 y.o. female patient admitted with Intentional Tylenol overdose.  HPI:   Maria Sampson reports history of Depression and Cannabis abuse. She states that she was brought to [facility] ED last week Tuesday after she intentionally overdosed on 50 Tablets of Tylenol in an attempt to kill herself. She reports increased depression and stress. 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 She refused to provide further information about her mental illness and circumstance. She is not able to contract for safety.  Past Psychiatric History: Patient has no previous acute psychiatric hospitalization.  Risk to Self: Is patient at risk for suicide?: Yes Risk to Others:   Prior Inpatient Therapy:   Prior Outpatient Therapy:    Past  Medical History:  Past Medical History:  Diagnosis Date  . Depression   . Otitis externa due to Pseudomonas aeruginosa 04/2014    Past Surgical History:  Procedure Laterality Date  . DENTAL EXAMINATION UNDER ANESTHESIA     Family History:  Family History  Problem Relation Age of Onset  . Crohn's disease Other    Family Psychiatric  History: Patient denied family history of psychotic illness. Social History:  History  Alcohol Use  . Yes    Comment: social     History  Drug Use  . Types: Marijuana    Social History   Social History  . Marital status: Single    Spouse name: N/A  . Number of children: N/A  . Years of education: N/A   Social History Main Topics  . Smoking status: Current Every Day Smoker    Types: Cigarettes  . Smokeless tobacco: Never Used  . Alcohol use Yes     Comment: social  . Drug use: Yes    Types: Marijuana  . Sexual activity: Yes    Birth control/ protection: Condom   Other Topics Concern  . None   Social History Narrative  . None   Additional Social History:    Allergies:   Allergies  Allergen Reactions  . Peanut-Containing Drug Products Anaphylaxis and Hives  . Food Hives, Itching and Swelling    PEANUTS    Labs:  Results for orders placed or performed during the hospital encounter of 02/20/17 (from the past 48 hour(s))  Comprehensive metabolic panel     Status: Abnormal   Collection Time: 02/23/17  8:26 AM  Result Value Ref Range   Sodium 136 135 - 145 mmol/L   Potassium 3.8 3.5 -  5.1 mmol/L   Chloride 101 101 - 111 mmol/L   CO2 27 22 - 32 mmol/L   Glucose, Bld 91 65 - 99 mg/dL   BUN 7 6 - 20 mg/dL   Creatinine, Ser 0.88 0.44 - 1.00 mg/dL   Calcium 9.2 8.9 - 10.3 mg/dL   Total Protein 6.4 (L) 6.5 - 8.1 g/dL   Albumin 3.8 3.5 - 5.0 g/dL   AST 83 (H) 15 - 41 U/L   ALT 141 (H) 14 - 54 U/L   Alkaline Phosphatase 50 38 - 126 U/L   Total Bilirubin 0.7 0.3 - 1.2 mg/dL   GFR calc non Af Amer >60 >60 mL/min   GFR calc Af  Amer >60 >60 mL/min    Comment: (NOTE) The eGFR has been calculated using the CKD EPI equation. This calculation has not been validated in all clinical situations. eGFR's persistently <60 mL/min signify possible Chronic Kidney Disease.    Anion gap 8 5 - 15  Comprehensive metabolic panel     Status: Abnormal   Collection Time: 02/24/17  4:40 AM  Result Value Ref Range   Sodium 137 135 - 145 mmol/L   Potassium 3.5 3.5 - 5.1 mmol/L   Chloride 102 101 - 111 mmol/L   CO2 27 22 - 32 mmol/L   Glucose, Bld 78 65 - 99 mg/dL   BUN 12 6 - 20 mg/dL   Creatinine, Ser 0.85 0.44 - 1.00 mg/dL   Calcium 9.2 8.9 - 10.3 mg/dL   Total Protein 6.0 (L) 6.5 - 8.1 g/dL   Albumin 3.3 (L) 3.5 - 5.0 g/dL   AST 40 15 - 41 U/L   ALT 102 (H) 14 - 54 U/L   Alkaline Phosphatase 60 38 - 126 U/L   Total Bilirubin 0.5 0.3 - 1.2 mg/dL   GFR calc non Af Amer >60 >60 mL/min   GFR calc Af Amer >60 >60 mL/min    Comment: (NOTE) The eGFR has been calculated using the CKD EPI equation. This calculation has not been validated in all clinical situations. eGFR's persistently <60 mL/min signify possible Chronic Kidney Disease.    Anion gap 8 5 - 15    Current Facility-Administered Medications  Medication Dose Route Frequency Provider Last Rate Last Dose  . diclofenac sodium (VOLTAREN) 1 % transdermal gel 4 g  4 g Topical TID PRN Waldemar Dickens, MD   4 g at 02/20/17 1716  . diphenhydrAMINE (BENADRYL) capsule 25 mg  25 mg Oral Q8H PRN Reyne Dumas, MD   25 mg at 02/21/17 2238  . diphenhydrAMINE-zinc acetate (BENADRYL) 2-0.1 % cream   Topical Daily PRN Mariel Aloe, MD      . enoxaparin (LOVENOX) injection 40 mg  40 mg Subcutaneous Q24H Waldemar Dickens, MD   40 mg at 02/20/17 1019  . hydrALAZINE (APRESOLINE) injection 5-10 mg  5-10 mg Intravenous Q4H PRN Waldemar Dickens, MD      . LORazepam (ATIVAN) injection 0.5 mg  0.5 mg Intravenous Q6H PRN Waldemar Dickens, MD      . metoCLOPramide [facility]) injection 10 mg  10 mg  Intravenous Once Waldemar Dickens, MD   Stopped at 02/20/17 [phone]  . ondansetron (ZOFRAN-ODT) disintegrating tablet 4 mg  4 mg Oral Q8H PRN Waldemar Dickens, MD   4 mg at 02/21/17 1716  . traMADol (ULTRAM) tablet 50 mg  50 mg Oral Q6H PRN Mariel Aloe, MD        Musculoskeletal:  Strength & Muscle Tone: within normal limits and decreased Gait & Station: normal, unable to stand Patient leans: N/A  Psychiatric Specialty Exam: Physical Exam as per history and physical   Review of Systems  Psychiatric/Behavioral: Positive for substance abuse.   patient is poorly cooperative during this evaluation. Patient does not appear to be responding to internal stimuli but she is withdrawn and nodding her head instead of talking.   Blood pressure (!) 105/53, pulse (!) 57, temperature 98.5 F (36.9 C), temperature source Oral, resp. rate 16, height _0  (1.803 m), weight 71.4 kg (157 lb 6.5 oz), last menstrual period 01/29/2017, SpO2 100 %.Body mass index is 21.95 kg/m.  General Appearance: Disheveled and Guarded  Eye Contact:  Minimal  Speech:  Normal Rate  Volume:  Decreased  Mood:  Depressed  Affect:  Constricted and Depressed  Thought Process:  Coherent and Descriptions of Associations: Intact  Orientation:  Full (Time, Place, and Person)  Thought Content:  Logical  Suicidal Thoughts:  Yes.  with intent/plan  Homicidal Thoughts:  No  Memory:  Immediate;   Fair Recent;   Fair Remote;   Fair  Judgement:  Poor  Insight:  Fair  Psychomotor Activity:  Decreased  Concentration:  Concentration: Fair and Attention Span: Fair  Recall:  [employer] of Knowledge:  Fair  Language:  Good  Akathisia:  Negative  Handed:  Right  AIMS (if indicated):     Assets:  Communication Skills Desire for Improvement Financial Resources/Insurance Housing Leisure Time Resilience Social Support  ADL's:  Impaired  Cognition:  WNL  Sleep:        Treatment Plan Summary: 26 years old female with history of  Depression and stress who was admitted for increased symptoms of depressive disorder,  status post suicidal attempt by intentional overdosed on 50 tablets of Tylenol. Patient is evasive, refused to give detail history and unable to contract for safety. Patient will benefit from psychiatric  inpatient admission for stabilization.  Plan/Recommendation: -Chief Technology Officer as patient Sales promotion account executive for safety at this time. -Recommended no psychotropic medication until patient is medically cleared -Daily contact with patient to assess and evaluate symptoms and progress in treatment and Medication management  -Appreciate psychiatric consultation and follow up as clinically required -Please contact [phone] or [phone] if needs further assistance  Disposition: Recommend psychiatric Inpatient admission when medically cleared. Supportive therapy provided about ongoing stressors.  Corena Pilgrim, MD 02/24/2017 2:06 PM

## 2017-02-24 NOTE — Discharge Summary (Addendum)
Physician Discharge Summary  Maria Sampson [id number] DOB: Aug 06, 1991 DOA: 02/20/2017  PCP: Pcp Not In System  Admit date: 02/20/2017 Discharge date: 02/26/2017  Admitted From: Home Disposition: Behavioral health hospital  Recommendations for Outpatient Follow-up:  1. Follow up with PCP in 1 week 2. Behavioral health management 3. Patient needs a primary care physician   Discharge Condition: Stable CODE STATUS: Full code Diet recommendation: Regular diet   Brief/Interim Summary:  Admission HPI written by Maria Dickens, MD   Chief Complaint: Suicide attempt  HPI: Maria Sampson is a 26 y.o. female with medical history significant of depression presenting after admitting to ingesting 2 bottles of tylenol and 1 bottle of Nyquil.    History limited due to pt being unwilling in large part to participate. Pt OK at this time with female providers. Pt does not want to discuss why she took the tylenol and Nyquil. States her only symptoms are nausea which worsened after the mucomyst and RUQ pain w/ radiation to the back. Pain is constant and achy. Getting worse. Denies any fevers, CP. Does endorse onset of HA since presenting to ED. No further HPI or ROS able to be obtained as pt unwilling to participate in conversation.   ED Course: Pt given Mucomyst per protocol. Pt resistent to lab draws and IV   Hospital course:  Suicide attempt Tylenol overdose Tylenol level initially at 210. Given three doses of N-acetylcysteine and Tylenol level decreased to undetectable levels. Psychiatry evaluated and recommended inpatient behavioral health management.  Abdominal pain Elevated transaminases Secondary to Tylenol ingestion. X-ray of abdomen and ultrasound of right upper quadrant were unremarkable. AST and ALT rose slightly before decreasing.  Addendum in italics: Constipation Continue Miralax. No evidence of bowel obstruction or ileus.  Discharge Diagnoses:  Principal Problem:   Tylenol overdose, intentional self-harm, initial encounter [facility]) Active Problems:   Depressive disorder   Abdominal pain   Suicide attempt   Mild tetrahydrocannabinol (THC) abuse   Constipation    Discharge Instructions  Discharge Instructions    Call MD for:  difficulty breathing, headache or visual disturbances    Complete by:  As directed    Call MD for:  persistant nausea and vomiting    Complete by:  As directed    Call MD for:  severe uncontrolled pain    Complete by:  As directed    Call MD for:  temperature >100.4    Complete by:  As directed      Allergies as of 02/24/2017      Reactions   Peanut-containing Drug Products Anaphylaxis, Hives   Food Hives, Itching, Swelling   PEANUTS      Medication List    STOP taking these medications   ibuprofen 800 MG tablet Commonly known as:  ADVIL,MOTRIN   methocarbamol 500 MG tablet Commonly known as:  ROBAXIN   naproxen 500 MG tablet Commonly known as:  NAPROSYN       Allergies  Allergen Reactions  . Peanut-Containing Drug Products Anaphylaxis and Hives  . Food Hives, Itching and Swelling    PEANUTS    Consultations:  Psychiatry   Procedures/Studies: Dg Abd Portable 1v  Result Date: 02/22/2017 CLINICAL DATA:  Abdominal pain and back pain over the last 2 days. EXAM: PORTABLE ABDOMEN - 1 VIEW COMPARISON:  10/30/2015 FINDINGS: Bowel gas pattern is normal without evidence of ileus, obstruction or visible free air. No abnormal calcifications or bone findings. Phleboliths in the pelvis. Bony structures appear unremarkable. IMPRESSION: Negative  radiography. Electronically Signed   By: Nelson Chimes M.D.   On: 02/22/2017 12:35   US Abdomen Limited Ruq  Result Date: 02/22/2017 CLINICAL DATA:  Abdominal pain x2 days with elevated liver function tests. EXAM: US ABDOMEN LIMITED - RIGHT UPPER QUADRANT COMPARISON:  10/28/2015 CT FINDINGS: Gallbladder: No gallstones or wall thickening visualized. No sonographic Murphy sign  noted by sonographer. Common bile duct: Diameter: 1.9 mm Liver: No focal lesion identified. Within normal limits in parenchymal echogenicity. IMPRESSION: No sonographic acute abnormality of the right upper quadrant of the abdomen. Electronically Signed   By: Ashley Royalty M.D.   On: 02/22/2017 19:41       Subjective: No nausea, chest pain or abdominal pain.  Discharge Exam: Vitals:   02/23/17 2240 02/24/17 0635  BP: 126/76 (!) 105/53  Pulse: 60 (!) 57  Resp: 18 16  Temp: 98.4 F (36.9 C) 98.5 F (36.9 C)   Vitals:   02/23/17 0617 02/23/17 1606 02/23/17 2240 02/24/17 0635  BP: 114/60 (!) 105/52 126/76 (!) 105/53  Pulse: 68 79 60 (!) 57  Resp: 17 16 18 16   Temp: 98.3 F (36.8 C) 98.4 F (36.9 C) 98.4 F (36.9 C) 98.5 F (36.9 C)  TempSrc: Oral Oral Oral Oral  SpO2: 100% 100% 100% 100%  Weight:      Height:        General exam: Appears calm and comfortable Respiratory system: Clear to auscultation. Respiratory effort normal. Cardiovascular system: S1 & S2 heard, RRR. No murmurs, rubs, gallops or clicks. Gastrointestinal system: Abdomen is nondistended, soft and mildly tender in RUQ. Normal bowel sounds heard. Central nervous system: Alert and oriented. No focal neurological deficits. Extremities: No edema. No calf tenderness Skin: No cyanosis. No rashes Psychiatry: Judgement and insight appear poor. Mood & affect flat and depressed.    The results of significant diagnostics from this hospitalization (including imaging, microbiology, ancillary and laboratory) are listed below for reference.      Labs: Basic Metabolic Panel:  Recent Labs Lab 02/20/17 0748 02/21/17 0456 02/22/17 0537 02/23/17 0826 02/24/17 0440  NA 138 136 137 136 137  K 4.0 3.7 3.9 3.8 3.5  CL 105 104 104 101 102  CO2 24 24 26 27 27   GLUCOSE 102* 89 82 91 78  BUN 7 7 7 7 12   CREATININE 0.82 0.80 0.89 0.88 0.85  CALCIUM 9.4 8.7* 9.0 9.2 9.2  MG  --  1.8  --   --   --   PHOS  --  3.0  --    --   --    Liver Function Tests:  Recent Labs Lab 02/20/17 0748 02/21/17 0456 02/22/17 0537 02/23/17 0826 02/24/17 0440  AST 30 32 112* 83* 40  ALT 21 35 110* 141* 102*  ALKPHOS 64 48 50 50 60  BILITOT 0.9 1.1 0.8 0.7 0.5  PROT 6.6 5.3* 5.7* 6.4* 6.0*  ALBUMIN 4.1 3.2* 3.4* 3.8 3.3*    Recent Labs Lab 02/21/17 2019  LIPASE 17   CBC:  Recent Labs Lab 02/20/17 0320  WBC 7.5  HGB 12.8  HCT 37.8  MCV 84.4  PLT 277   CBG:  Recent Labs Lab 02/20/17 0458  GLUCAP 111*   Urinalysis    Component Value Date/Time   COLORURINE YELLOW 02/21/2017 1819   APPEARANCEUR CLEAR 02/21/2017 1819   LABSPEC 1.012 02/21/2017 1819   PHURINE 5.0 02/21/2017 1819   GLUCOSEU NEGATIVE 02/21/2017 1819   HGBUR NEGATIVE 02/21/2017 1819   BILIRUBINUR NEGATIVE  02/21/2017 1819   KETONESUR NEGATIVE 02/21/2017 1819   PROTEINUR NEGATIVE 02/21/2017 1819   UROBILINOGEN 1.0 05/10/2015 0008   NITRITE NEGATIVE 02/21/2017 1819   LEUKOCYTESUR NEGATIVE 02/21/2017 1819    Time coordinating discharge: Over 30 minutes  SIGNED:   Cordelia Poche, MD [facility] 02/24/2017, 10:28 AM Pager [phone]  If 7PM-7AM, please contact night-coverage [email or web address] Password [email or web address]

## 2017-02-24 NOTE — Progress Notes (Signed)
CSW received a call from RN requesting status of pt's potential bed at [facility].  CSW called Disposition to request bed status and received no answer.  CSW left HIPPA compliant VM requesting information on bed status of a pt.  CSW awaiting return call.  Alphonse Guild. Kaedyn Polivka, Latanya Presser, LCAS Clinical Social Worker Ph: [phone]

## 2017-02-25 DIAGNOSIS — K5900 Constipation, unspecified: Secondary | ICD-10-CM

## 2017-02-25 MED ORDER — POLYETHYLENE GLYCOL 3350 17 G PO PACK
17.0000 g | PACK | Freq: Two times a day (BID) | ORAL | Status: DC
  Administered 2017-02-25 (×2): 17 g via ORAL
  Filled 2017-02-25 (×3): qty 1

## 2017-02-25 NOTE — Progress Notes (Signed)
Patient on waitlist at [facility]. [facility] is at capacity.  Maria Sampson Zumwalt LCSWA [phone]

## 2017-02-25 NOTE — Progress Notes (Signed)
PROGRESS NOTE    Maria Sampson  GXQ:[id number] DOB: 1991-03-15 DOA: 02/20/2017 PCP: Pcp Not In System   Brief Narrative: Maria Sampson is a 26 y.o. female with a history of depression. She presented after a suicide attempt and Tylenol overdose.   Assessment & Plan:   Principal Problem:   Tylenol overdose, intentional self-harm, initial encounter [facility]) Active Problems:   Depressive disorder   Abdominal pain   Suicide attempt   Mild tetrahydrocannabinol (THC) abuse   Suicide attempt Tylenol overdose Tylenol level undetectable. Medically stable from this standpoint -sitter [facility] transfer  Abdominal pain Elevated transaminases Pain improved. No stool on KUB. Normal RUQ. ALT still rising slightly. Secondary to Tylenol overdose.  Constipation -miralax   DVT prophylaxis: Lovenox Code Status: Full code Family Communication: None at bedside Disposition Plan: Discharge to behavioral health. Medically stable for discharge.   Consultants:   Psychiatry  Procedures:   None  Antimicrobials:  None    Subjective: Constipated.  Objective: Vitals:   02/23/17 1606 02/23/17 2240 02/24/17 0635 02/25/17 0603  BP: (!) 105/52 126/76 (!) 105/53 110/61  Pulse: 79 60 (!) 57 (!) 52  Resp: 16 18 16 16   Temp: 98.4 F (36.9 C) 98.4 F (36.9 C) 98.5 F (36.9 C) 98 F (36.7 C)  TempSrc: Oral Oral Oral Oral  SpO2: 100% 100% 100%   Weight:      Height:        Intake/Output Summary (Last 24 hours) at 02/25/17 1136 Last data filed at 02/25/17 0843  Gross per 24 hour  Intake              720 ml  Output                0 ml  Net              720 ml   Filed Weights   02/20/17 1442  Weight: 71.4 kg (157 lb 6.5 oz)    Examination:  General exam: Appears calm and comfortable Psychiatry: Judgement and insight appear poor. Mood & affect flat and depressed. Protective around suicidal ideation    Data Reviewed: I have personally reviewed following labs and imaging  studies  CBC:  Recent Labs Lab 02/20/17 0320  WBC 7.5  HGB 12.8  HCT 37.8  MCV 84.4  PLT 144   Basic Metabolic Panel:  Recent Labs Lab 02/20/17 0748 02/21/17 0456 02/22/17 0537 02/23/17 0826 02/24/17 0440  NA 138 136 137 136 137  K 4.0 3.7 3.9 3.8 3.5  CL 105 104 104 101 102  CO2 24 24 26 27 27   GLUCOSE 102* 89 82 91 78  BUN 7 7 7 7 12   CREATININE 0.82 0.80 0.89 0.88 0.85  CALCIUM 9.4 8.7* 9.0 9.2 9.2  MG  --  1.8  --   --   --   PHOS  --  3.0  --   --   --    GFR: Estimated Creatinine Clearance: 111.1 mL/min (by C-G formula based on SCr of 0.85 mg/dL). Liver Function Tests:  Recent Labs Lab 02/20/17 0748 02/21/17 0456 02/22/17 0537 02/23/17 0826 02/24/17 0440  AST 30 32 112* 83* 40  ALT 21 35 110* 141* 102*  ALKPHOS 64 48 50 50 60  BILITOT 0.9 1.1 0.8 0.7 0.5  PROT 6.6 5.3* 5.7* 6.4* 6.0*  ALBUMIN 4.1 3.2* 3.4* 3.8 3.3*    Recent Labs Lab 02/21/17 2019  LIPASE 17   No results for input(s): AMMONIA  in the last 168 hours. Coagulation Profile:  Recent Labs Lab 02/20/17 0731 02/22/17 0537  INR 1.20 1.34   Cardiac Enzymes: No results for input(s): CKTOTAL, CKMB, CKMBINDEX, TROPONINI in the last 168 hours. BNP (last 3 results) No results for input(s): PROBNP in the last 8760 hours. HbA1C: No results for input(s): HGBA1C in the last 72 hours. CBG:  Recent Labs Lab 02/20/17 0458  GLUCAP 111*   Lipid Profile: No results for input(s): CHOL, HDL, LDLCALC, TRIG, CHOLHDL, LDLDIRECT in the last 72 hours. Thyroid Function Tests: No results for input(s): TSH, T4TOTAL, FREET4, T3FREE, THYROIDAB in the last 72 hours. Anemia Panel: No results for input(s): VITAMINB12, FOLATE, FERRITIN, TIBC, IRON, RETICCTPCT in the last 72 hours. Sepsis Labs:  Recent Labs Lab 02/20/17 0731  LATICACIDVEN 1.6    No results found for this or any previous visit (from the past 240 hour(s)).       Radiology Studies: No results found.      Scheduled  Meds: . enoxaparin (LOVENOX) injection  40 mg Subcutaneous Q24H  . metoCLOPramide (REGLAN) injection  10 mg Intravenous Once  . polyethylene glycol  17 g Oral BID   Continuous Infusions:   LOS: 5 days     Cordelia Poche, MD [facility] 02/25/2017, 11:36 AM Pager: [phone]  If 7PM-7AM, please contact night-coverage [email or web address] Password [facility] 02/25/2017, 11:36 AM

## 2017-02-25 NOTE — Progress Notes (Signed)
Psych Nurse Liaison Note   D:In a 1:1 Pt states that she is feeling better and wants to go to [employer] . States, "I really need to learn more about me and how to deal with my feelings. This has been an eye opening experience and I don't want to die. I just need to learn how to express and share my feelings". Pt continued to verbalize she was  adopted by her Aunt and Uncle. Feels that she, in many ways, "Doesn't fit in and is not loved. Verbalized also, "I want to change that. I want to belong and fit in. I do not want to die. I want to live". Pt was able to call some friends and was pleased that they came to visit her. A: Role play used. Active listening provided. Encouragement and praise given. Pt also given homework to begin her healing process and begin to work on her. R: Pt denies SI and HI.

## 2017-02-26 ENCOUNTER — Encounter (HOSPITAL_COMMUNITY): Payer: Self-pay | Admitting: *Deleted

## 2017-02-26 ENCOUNTER — Inpatient Hospital Stay (HOSPITAL_COMMUNITY)
Admission: AD | Admit: 2017-02-26 | Discharge: 2017-03-01 | DRG: 885 | Disposition: A | Discharge status: Home / Self Care | Payer: No Typology Code available for payment source | Attending: Psychiatry | Admitting: Psychiatry

## 2017-02-26 DIAGNOSIS — G4700 Insomnia, unspecified: Secondary | ICD-10-CM | POA: Diagnosis present

## 2017-02-26 DIAGNOSIS — F17210 Nicotine dependence, cigarettes, uncomplicated: Secondary | ICD-10-CM | POA: Diagnosis present

## 2017-02-26 DIAGNOSIS — F419 Anxiety disorder, unspecified: Secondary | ICD-10-CM | POA: Diagnosis present

## 2017-02-26 DIAGNOSIS — Z79899 Other long term (current) drug therapy: Secondary | ICD-10-CM

## 2017-02-26 DIAGNOSIS — T1491XA Suicide attempt, initial encounter: Secondary | ICD-10-CM

## 2017-02-26 DIAGNOSIS — Z915 Personal history of self-harm: Secondary | ICD-10-CM | POA: Diagnosis not present

## 2017-02-26 DIAGNOSIS — F332 Major depressive disorder, recurrent severe without psychotic features: Principal | ICD-10-CM | POA: Diagnosis present

## 2017-02-26 DIAGNOSIS — T391X2A Poisoning by 4-Aminophenol derivatives, intentional self-harm, initial encounter: Secondary | ICD-10-CM | POA: Diagnosis present

## 2017-02-26 DIAGNOSIS — Z91010 Allergy to peanuts: Secondary | ICD-10-CM

## 2017-02-26 DIAGNOSIS — K5900 Constipation, unspecified: Secondary | ICD-10-CM

## 2017-02-26 MED ORDER — TRAZODONE HCL 50 MG PO TABS
50.0000 mg | ORAL_TABLET | Freq: Every evening | ORAL | Status: DC | PRN
  Filled 2017-02-26: qty 7
  Filled 2017-02-26: qty 1

## 2017-02-26 MED ORDER — ACETAMINOPHEN 325 MG PO TABS: 650.0000 mg | ORAL_TABLET | Freq: Four times a day (QID) | ORAL | Status: DC | PRN

## 2017-02-26 MED ORDER — MAGNESIUM CITRATE PO SOLN: 1.0000 | Freq: Once | ORAL | Status: DC

## 2017-02-26 MED ORDER — POLYETHYLENE GLYCOL 3350 17 G PO PACK: 17.0000 g | PACK | Freq: Two times a day (BID) | ORAL | Status: DC

## 2017-02-26 MED ORDER — MAGNESIUM HYDROXIDE 400 MG/5ML PO SUSP: 30.0000 mL | Freq: Every day | ORAL | Status: DC | PRN

## 2017-02-26 NOTE — Progress Notes (Signed)
[facility] alerted CSW that they may have a bed for pt today after 1pm.  Cedric Fishman LCSWA [phone]
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
## 2017-02-26 NOTE — Progress Notes (Signed)
Psych Nurse Liaison Note  D: Was called by Patients nurse this morning, who said Patient was not wanting to go to [facility] and was "not like she was yesterday. She wants to leave. States we are holding her against her will". This writer went to visit patient whose affect was angry with no eye contact and was verbalizing, "I want to leave. You cannot hold me here. I have been here for 6 days, sitting in this one spot and not moving. I have been here long enough. I don't want to go to [facility]. They are telling me I don't have insurance. They have not let me get my things so I can show them I have insurance. I have rights. You are holding me here against my will. I want to speak with a patient advocate and get a second opinion from someone who is not associated with this hospital"" I am over this. I am totally over this". In the 1:1, Pt would not allow this writer to complete sentences and was attempting to start an argument. Was informed that her overdose was very serious.That she had said she wanted to go to [facility] voluntarily but if she was not willing to do it voluntarily, the doctors might be forced to IVC her for her own safety. Pt took this as a threat and became angry stating "now you are threatening me. I came to this hospital voluntarily and now you are going to force me to go to another hospital. I want to see the paperwork, the law, that says I have to go there". Asked Pt. What had changed between yesterday and today. Pt stated "I talked to my mother who told me that if I don't want to be here, I don't have to be here. I want to leave. I want to go back to my job and make money so I can pay for my insurance that you say I don't have". Pt then told this writer that she was recording the conversation on her phone and someone was listening to it the whole time.  A) Attempted to explain the benefits of [facility] to Pt. Given encouragement and praise appropriately. Talked with Patients Nurse, Claiborne Billings and informed her of  Patients comments and behaviors. Texted Dr. Loni Muse. To let him know the changed status of the Pt's demands to be released. Talked with  Claiborne Billings the nurse and informed her that Pt would like to speak with the Patient Advocate for the unit. Used a soft even voice with the Patient. Reminded Pt several times the reason she was here in the hospital. She had taken a very serious overdose.  R) Pt is agitated, argumentative, and does not want to be in the hospital. Angry with the hospital, this writer and feels as though she is being threatened. Some paranoia noted. Poor eye contact. Continues to repeat "I have been here for 6 days, lying in this bed, in the same position. I have been here enough. You cannot force me to go to another hospital, even if I did overdose. I am over that"

## 2017-02-26 NOTE — Tx Team (Signed)
Initial Treatment Plan 02/26/2017 5:27 PM DORENE BRUNI [id number]    PATIENT STRESSORS: Educational concerns Occupational concerns   PATIENT STRENGTHS: Ability for insight Capable of independent living Child psychotherapist Physical Health Work skills   PATIENT IDENTIFIED PROBLEMS: Risk  For self injury "I felt like everything was out of my control, I just wanted to sleep for a long time".  Alteration in mood (Depression, Anxiety) "I just work and go to school, I have no social life".  Ineffective coping skills                  DISCHARGE CRITERIA:  Improved stabilization in mood, thinking, and/or behavior Motivation to continue treatment in a less acute level of care Need for constant or close observation no longer present Verbal commitment to aftercare and medication compliance  PRELIMINARY DISCHARGE PLAN: Outpatient therapy Return to previous work or school arrangements  PATIENT/FAMILY INVOLVEMENT: This treatment plan has been presented to and reviewed with the patient, Maria Sampson.  The patient have been given the opportunity to ask questions and make suggestions.  Keane Police, RN 02/26/2017, 5:27 PM

## 2017-02-26 NOTE — Progress Notes (Signed)
PROGRESS NOTE    [name]  KXF:[id number] DOB: 12-15-1990 DOA: 02/20/2017 PCP: Pcp Not In System   Brief Narrative: Maria Sampson is a 26 y.o. female with a history of depression. She presented after a suicide attempt and Tylenol overdose.   Assessment & Plan:   Principal Problem:   Tylenol overdose, intentional self-harm, initial encounter [facility]) Active Problems:   Depressive disorder   Abdominal pain   Suicide attempt   Mild tetrahydrocannabinol (THC) abuse   Suicide attempt Tylenol overdose Tylenol level undetectable. Medically stable from this standpoint -sitter [facility] transfer  Abdominal pain Elevated transaminases Pain improved. No stool on initial KUB. Normal RUQ. ALT/AST  improved. Secondary to Tylenol overdose.  Constipation -miralax -add mag citrate x1   DVT prophylaxis: Lovenox Code Status: Full code Family Communication: None at bedside Disposition Plan: Discharge to behavioral health (voluntary). Medically stable for discharge.   Consultants:   Psychiatry  Procedures:   None  Antimicrobials:  None    Subjective: Constipated.  Objective: Vitals:   02/24/17 0635 02/25/17 0603 02/25/17 1430 02/26/17 0203  BP: (!) 105/53 110/61 119/63 114/65  Pulse: (!) 57 (!) 52 (!) 56 64  Resp: 16 16 19 18   Temp: 98.5 F (36.9 C) 98 F (36.7 C) 98.2 F (36.8 C) 98.1 F (36.7 C)  TempSrc: Oral Oral Oral Oral  SpO2: 100%  100% 100%  Weight:      Height:       No intake or output data in the 24 hours ending 02/26/17 1000 Filed Weights   02/20/17 1442  Weight: 71.4 kg (157 lb 6.5 oz)    Examination:  General exam: Appears calm and comfortable Abdomen: Soft Psychiatry: Judgement and insight appear poor. Mood & affect flat and depressed.    Data Reviewed: I have personally reviewed following labs and imaging studies  CBC:  Recent Labs Lab 02/20/17 0320  WBC 7.5  HGB 12.8  HCT 37.8  MCV 84.4  PLT 696   Basic Metabolic  Panel:  Recent Labs Lab 02/20/17 0748 02/21/17 0456 02/22/17 0537 02/23/17 0826 02/24/17 0440  NA 138 136 137 136 137  K 4.0 3.7 3.9 3.8 3.5  CL 105 104 104 101 102  CO2 24 24 26 27 27   GLUCOSE 102* 89 82 91 78  BUN 7 7 7 7 12   CREATININE 0.82 0.80 0.89 0.88 0.85  CALCIUM 9.4 8.7* 9.0 9.2 9.2  MG  --  1.8  --   --   --   PHOS  --  3.0  --   --   --    GFR: Estimated Creatinine Clearance: 111.1 mL/min (by C-G formula based on SCr of 0.85 mg/dL). Liver Function Tests:  Recent Labs Lab 02/20/17 0748 02/21/17 0456 02/22/17 0537 02/23/17 0826 02/24/17 0440  AST 30 32 112* 83* 40  ALT 21 35 110* 141* 102*  ALKPHOS 64 48 50 50 60  BILITOT 0.9 1.1 0.8 0.7 0.5  PROT 6.6 5.3* 5.7* 6.4* 6.0*  ALBUMIN 4.1 3.2* 3.4* 3.8 3.3*    Recent Labs Lab 02/21/17 2019  LIPASE 17   No results for input(s): AMMONIA in the last 168 hours. Coagulation Profile:  Recent Labs Lab 02/20/17 0731 02/22/17 0537  INR 1.20 1.34   Cardiac Enzymes: No results for input(s): CKTOTAL, CKMB, CKMBINDEX, TROPONINI in the last 168 hours. BNP (last 3 results) No results for input(s): PROBNP in the last 8760 hours. HbA1C: No results for input(s): HGBA1C in the last 72  hours. CBG:  Recent Labs Lab 02/20/17 0458  GLUCAP 111*   Lipid Profile: No results for input(s): CHOL, HDL, LDLCALC, TRIG, CHOLHDL, LDLDIRECT in the last 72 hours. Thyroid Function Tests: No results for input(s): TSH, T4TOTAL, FREET4, T3FREE, THYROIDAB in the last 72 hours. Anemia Panel: No results for input(s): VITAMINB12, FOLATE, FERRITIN, TIBC, IRON, RETICCTPCT in the last 72 hours. Sepsis Labs:  Recent Labs Lab 02/20/17 0731  LATICACIDVEN 1.6    No results found for this or any previous visit (from the past 240 hour(s)).       Radiology Studies: No results found.      Scheduled Meds: . enoxaparin (LOVENOX) injection  40 mg Subcutaneous Q24H  . magnesium citrate  1 Bottle Oral Once  . metoCLOPramide  (REGLAN) injection  10 mg Intravenous Once  . polyethylene glycol  17 g Oral BID   Continuous Infusions:   LOS: 6 days     Cordelia Poche, MD [facility] 02/26/2017, 10:00 AM Pager: [phone]  If 7PM-7AM, please contact night-coverage [email or web address] Password [email or web address] 02/26/2017, 10:00 AM

## 2017-02-26 NOTE — Progress Notes (Signed)
CSW faxed IVC Paperwork to Magistrate and confirmed receipt. Patient will be served and transported at the same time to [facility] (Report: [phone] Room 4062).  Percell Locus Fionn Stracke LCSWA [phone]

## 2017-02-27 DIAGNOSIS — T391X2A Poisoning by 4-Aminophenol derivatives, intentional self-harm, initial encounter: Secondary | ICD-10-CM

## 2017-02-27 DIAGNOSIS — K5900 Constipation, unspecified: Secondary | ICD-10-CM

## 2017-02-27 DIAGNOSIS — F332 Major depressive disorder, recurrent severe without psychotic features: Principal | ICD-10-CM

## 2017-02-27 DIAGNOSIS — Z79899 Other long term (current) drug therapy: Secondary | ICD-10-CM

## 2017-02-27 DIAGNOSIS — T450X2A Poisoning by antiallergic and antiemetic drugs, intentional self-harm, initial encounter: Secondary | ICD-10-CM

## 2017-02-27 DIAGNOSIS — T1491XA Suicide attempt, initial encounter: Secondary | ICD-10-CM

## 2017-02-27 MED ORDER — LORAZEPAM 0.5 MG PO TABS: 0.5000 mg | ORAL_TABLET | Freq: Four times a day (QID) | ORAL | Status: DC | PRN

## 2017-02-27 NOTE — Progress Notes (Signed)
Recreation Therapy Notes  Date: 02/27/17 Time: 0930 Location: 300 Hall Dayroom  Group Topic: Stress Management  Goal Area(s) Addresses:  Patient will verbalize importance of using healthy stress management.  Patient will identify positive emotions associated with healthy stress management.   Intervention: Stress Management  Activity :  Body Scan Meditation.  LRT introduced the stress management technique of meditation.  LRT played a meditation from the Calm app that focused on body scan to examine the feelings and tension within the body.  Patients were to follow along with the meditation to played to engage in the technique.  Education:  Stress Management, Discharge Planning.   Education Outcome: Acknowledges edcuation/In group clarification offered/Needs additional education  Clinical Observations/Feedback: Pt did not attend group.   Victorino Sparrow, LRT/CTRS         Victorino Sparrow A 02/27/2017 11:32 AM

## 2017-02-27 NOTE — Progress Notes (Signed)
Patient ID: Maria Sampson, female   DOB: 28-Dec-1990, 28 y.o.   MRN: [id number] D: Client seen in dayroom watching TV, not interaction noted. Client refused to talk to writer "I just want to go home, I was tricked into coming here" "I don't want to talk" A: Writer encouraged client to report any concerns. Medications reviewed, administered as ordered. Staff will monitor q6min for safety. R: Client is safe on the unit.

## 2017-02-27 NOTE — Progress Notes (Signed)
Patient ID: Maria Sampson, female   DOB: [birth year], 26 y.o.   MRN: [id number] D: client much more visible to day, seen dayroom interacting with peers, laughing and watching TV. A:  Writer provided emotional support, encouraged client to report any issues. Encouraged client to continue social interaction. Staff will monitor q55min for safety. R: Client is safe on the unit, attended group.

## 2017-02-27 NOTE — H&P (Addendum)
Psychiatric Admission Assessment Adult  Patient Identification: Maria Sampson MRN:  [id number] Date of Evaluation:  02/27/2017 Chief Complaint:  " I am upset I am here" ( referring to inpatient psychiatric unit ) . Principal Diagnosis:  Overdose  Diagnosis:   Patient Active Problem List   Diagnosis Date Noted  . Constipation [K59.00] 02/26/2017  . MDD (major depressive disorder), recurrent severe, without psychosis ([place]) [F33.2] 02/26/2017  . Suicide attempt [T14.91XA] 02/20/2017  . Tylenol overdose, intentional self-harm, initial encounter ([place]) [T39.1X2A] 02/20/2017  . Mild tetrahydrocannabinol (THC) abuse [F12.10] 02/20/2017  . BPV (benign positional vertigo) [H81.10] 10/27/2015  . Malnutrition of moderate degree [E44.0] 10/26/2015  . Abdominal pain [R10.9] 10/23/2015  . Nausea & vomiting [R11.2] 10/23/2015  . Sepsis ([place]) [A41.9] 10/23/2015  . Fibroid, uterine [D25.9] 10/23/2015  . Ileus ([place]) [K56.7] 10/23/2015  . Normocytic anemia [D64.9] 10/23/2015  . Overdose [T50.901A] 06/03/2014  . Depressive disorder [F32.9] 06/03/2014  . Suicidal ideation [R45.851] 06/03/2014  . Otitis externa due to Pseudomonas aeruginosa [B96.5, H62.40] 05/03/2014   History of Present Illness: 26 year old female , presented to ED via EMS after suicide attempt by overdosing on Acetaminophen and Nyquill. Initial admission Acetaminophen serum  level was 210, requiring N Acetyl- Cysteine management. LFTs became elevated, requiring initial medical management and stabilization. As per chart notes, patient presented as fair historian, refusing to provide much information, but acknowledging overdose and feeling overwhelmed due to being extremely busy ( grad school, employment ) .  Upon medical clearance she was admitted to [facility]. At this time patient minimizes depression and minimizes recent overdose- states " I did not want to die, I was just upset ", and states she knew that the dose she took would not cause  any lasting damage . She also minimizes depression or neuro-vegetative symptoms leading up to admission, endorsing only anxiety, feeling overwhelmed due to how busy she was. At this time focused on being unhappy about being in hospital, stating " I feel like I am here under false pretenses, I told them I was not suicidal " .  Associated Signs/Symptoms: Depression Symptoms:  Denies changes in appetite, energy level , sleep. Does state she has lost a very significant of weight ( close to 80 lbs ) over recent months, which she states is not due to depression or changes of appetite but to leading a more active lifestyle and working long hours . Denies anhedonia. Denies any current suicidal ideations. (Hypo) Manic Symptoms:  Denies- presents irritable  Anxiety Symptoms:  Denies , except for stating she needs to return to work and to her classes soon Psychotic Symptoms:  Denies  PTSD Symptoms: Denies  Total Time spent with patient: 45 minutes  Past Psychiatric History: Denies any prior psychiatric admissions, denies any prior history of suicide attempts or self injurious behaviors, denies history of psychosis, does not endorse history of mania, denies history of severe depressive episodes, denies history of panic or agoraphobia, denies history of PTSD, denies history of violence   Is the patient at risk to self? Yes.    Has the patient been a risk to self in the past 6 months? No.  Has the patient been a risk to self within the distant past? No.  Is the patient a risk to others? No.  Has the patient been a risk to others in the past 6 months? No.  Has the patient been a risk to others within the distant past? No.   Prior Inpatient Therapy:  denies  Prior Outpatient Therapy:  denies   Alcohol Screening: Patient refused Alcohol Screening Tool: Yes 1. How often do you have a drink containing alcohol?: Never ("I stopped drinking 3 years ago, when I turned 22") 9. Have you or someone else been  injured as a result of your drinking?: No 10. Has a relative or friend or a doctor or another health worker been concerned about your drinking or suggested you cut down?: No Alcohol Use Disorder Identification Test Final Score (AUDIT): 0 Brief Intervention: Patient declined brief intervention Substance Abuse History in the last 12 months:  Denies alcohol abuse , denies drug abuse , endorses cannabis use, UDS positive for cannabis, BAL < 5.  Consequences of Substance Abuse: Denies  Previous Psychotropic Medications: denies  Psychological Evaluations: no  Past Medical History: she denies medical history .  Past Medical History:  Diagnosis Date  . Depression   . Otitis externa due to Pseudomonas aeruginosa 04/2014    Past Surgical History:  Procedure Laterality Date  . DENTAL EXAMINATION UNDER ANESTHESIA     Family History: patient does not elaborate, states " it has nothing to do with being here and wanting to be discharged " Family History  Problem Relation Age of Onset  . Crohn's disease Other    Family Psychiatric  History: does not elaborate Tobacco Screening: Have you used any form of tobacco in the last 30 days? (Cigarettes, Smokeless Tobacco, Cigars, and/or Pipes): Yes Tobacco use, Select all that apply: cigar use daily (Pt +for THC as well) Are you interested in Tobacco Cessation Medications?: No, patient refused Counseled patient on smoking cessation including recognizing danger situations, developing coping skills and basic information about quitting provided: Refused/Declined practical counseling Social History: single, grad [employer], employed , denies acute psychosocial stressors other than competing demands of work/academics  History  Alcohol Use  . Yes    Comment: social     History  Drug Use  . Types: Marijuana    Additional Social History:   Allergies:   Allergies  Allergen Reactions  . Peanut-Containing Drug Products Anaphylaxis and Hives  . Food  Hives, Itching and Swelling    PEANUTS   Lab Results: No results found for this or any previous visit (from the past 48 hour(s)).  Blood Alcohol level:  Lab Results  Component Value Date   [facility] <5 02/20/2017   ETH <11 36/46/8032    Metabolic Disorder Labs:  No results found for: HGBA1C, MPG No results found for: PROLACTIN Lab Results  Component Value Date   TRIG 13 10/29/2008    Current Medications: Current Facility-Administered Medications  Medication Dose Route Frequency Provider Last Rate Last Dose  . acetaminophen (TYLENOL) tablet 650 mg  650 mg Oral Q6H PRN Benjamine Mola, FNP      . magnesium hydroxide (MILK OF MAGNESIA) suspension 30 mL  30 mL Oral Daily PRN Benjamine Mola, FNP      . traZODone (DESYREL) tablet 50 mg  50 mg Oral QHS PRN Benjamine Mola, FNP       PTA Medications: Prescriptions Prior to Admission  Medication Sig Dispense Refill Last Dose  . polyethylene glycol (MIRALAX / GLYCOLAX) packet Take 17 g by mouth 2 (two) times daily.       Musculoskeletal: Strength & Muscle Tone: within normal limits Gait & Station: normal Patient leans: N/A  Psychiatric Specialty Exam: Physical Exam  Review of Systems  HENT: Negative.   Eyes: Negative.   Respiratory: Negative.  Gastrointestinal: Negative.   Genitourinary: Negative.   Musculoskeletal: Negative.   Skin: Negative.   Neurological: Negative.   Endo/Heme/Allergies: Negative.   Psychiatric/Behavioral: Negative for depression and hallucinations.  All other systems reviewed and are negative.   Blood pressure 125/68, pulse (!) 56, temperature 98.4 F (36.9 C), temperature source Oral, resp. rate 18, height 5\' 11"  (1.803 m), weight 69.9 kg (154 lb), last menstrual period 01/29/2017.Body mass index is 21.48 kg/m.  General Appearance: Fairly Groomed  Eye Contact:  Fair  Speech:  Normal Rate  Volume:  variable, loud at times   Mood:  Irritable- she denies feeling depressed   Affect:  irritable, although  improved partially as session progressed   Thought Process:  Linear and Descriptions of Associations: Intact  Orientation:  Other:  fully alert and attentive   Thought Content:  denies suicidal or self injurious ideations at this time , no hallucinations, no delusions expressed , not internally preoccupied   Suicidal Thoughts:  No denies any suicidal or self injurious ideations at this time, denies any homicidal or violent ideations, contracts for safety on unit   Homicidal Thoughts:  No  Memory:  recent and remote grossly intact   Judgement:  Fair  Insight:  Fair  Psychomotor Activity:  Normal  Concentration:  Concentration: Good and Attention Span: Good  Recall:  Good  Fund of Knowledge:  Good  Language:  Good  Akathisia:  Negative  Handed:  Right  AIMS (if indicated):     Assets:  Communication Skills Desire for Improvement Vocational/Educational  ADL's:  Intact  Cognition:  WNL  Sleep:  Number of Hours: 6.75    Treatment Plan Summary: Daily contact with patient to assess and evaluate symptoms and progress in treatment, Medication management, Plan inpatient admission  and medications as below  Observation Level/Precautions:  15 minute checks  Laboratory:  TSH - due to reports of mood disorder, anxious presentation, and report of significant weight loss  Will also repeat AST, ALT, as have been elevated following overdose, although trending down.   Psychotherapy:  Milieu, group therapy   Medications: we discussed options with patient . She does not want to be on any standing psychiatric medications at this time, states " I am not depressed, I just want to be discharged and get back to my life "    Consultations: as needed    Discharge Concerns:  -  Estimated LOS: 4-5 days   Other:     Physician Treatment Plan for Primary Diagnosis: Suicide Attempt, Overdose Long Term Goal(s): Improvement in symptoms so as ready for discharge  Short Term Goals: Ability to identify changes in  lifestyle to reduce recurrence of condition will improve, Ability to verbalize feelings will improve, Ability to disclose and discuss suicidal ideas, Ability to demonstrate self-control will improve, Ability to identify and develop effective coping behaviors will improve and Ability to maintain clinical measurements within normal limits will improve  Physician Treatment Plan for Secondary Diagnosis: Adjustment Disorder  Long Term Goal(s): Improvement in symptoms so as ready for discharge  Short Term Goals: Ability to identify changes in lifestyle to reduce recurrence of condition will improve and Ability to disclose and discuss suicidal ideas  I certify that inpatient services furnished can reasonably be expected to improve the patient's condition.    Jenne Campus, MD 4/11/20182:53 PM
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
## 2017-02-27 NOTE — [facility] Counselor (Signed)
CSW intern attempted to complete PSA, however patient declined. Patient stated " I do not want to talk to anybody, I just want to go home". CSW will continue to follow and assess.    Radonna Ricker, Student Social Work Intern  [employer]  [phone]

## 2017-02-27 NOTE — BHH Counselor (Signed)
Adult Comprehensive Assessment  Patient ID: Maria Sampson, female   DOB: [birth year], 26 y.o.   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 Family Relationships: estranged from bio mother; limited relationship with bio father Museum/gallery curator / Lack of resources (include bankruptcy): None reported Housing / Lack of housing: None reported Physical health (include injuries & life threatening diseases): lack of sleep as she works nights and goes to class during the day Social relationships: None reported Substance abuse: Pt denies; UDS positive for THC Bereavement / Loss: None reported  Living/Environment/Situation:  Living Arrangements: Non-relatives/Friends Living conditions (as described by patient or guardian): safe and stable How long has patient lived in current situation?: since August What is atmosphere in current home: Supportive, Comfortable  Family History:  Marital status: Single Does patient have children?: No  Childhood History:  By whom was/is the patient raised?: Adoptive parents Additional childhood history information: adopted by aunt and uncle at 57mo Description of patient's relationship with caregiver when they were a child: good relationship with aunt and uncle Patient's description of current relationship with people who raised him/her: close with aunt and uncle Does patient have siblings?: Yes Number of Siblings: 70 Description of patient's current relationship with siblings: between adoptive and biological siblings; close with adoptive siblings Did patient suffer any verbal/emotional/physical/sexual abuse as a child?: No Did patient suffer from severe childhood neglect?: No Has patient ever been sexually abused/assaulted/raped as an adolescent or adult?: No Was the patient ever a victim of a crime or a  disaster?: No Witnessed domestic violence?: No Has patient been effected by domestic violence as an adult?: No  Education:  Highest grade of school patient has completed: Water quality scientist degree; currently in grad school  Currently a student?: Yes If yes, how has current illness impacted academic performance: good academic standing Name of school: [employer] How long has the patient attended?: 73yrs  Employment/Work Situation:   Employment situation: Employed Where is patient currently employed?: [employer]- full time How long has patient been employed?: since 2016 What is the longest time patient has a held a job?: 17yrs Where was the patient employed at that time?: current employer Has patient ever been in the [employer]?: No Has patient ever served in combat?: No Did You Receive Any Psychiatric Treatment/Services While in Passenger transport manager?: No Are There Guns or Other Weapons in [place]?: No  Financial Resources:   Financial resources: Income from employment, Support from parents / caregiver, Private insurance Does patient have a representative payee or guardian?: No  Alcohol/Substance Abuse:   What has been your use of drugs/alcohol within the last 12 months?: Pt denies If attempted suicide, did drugs/alcohol play a role in this?: No Alcohol/Substance Abuse Treatment Hx: Denies past history Has alcohol/substance abuse ever caused legal problems?: No  Social Support System:   Pensions consultant Support System: Good Describe Community Support System: supportive friends and family Type of faith/religion: Darrick Meigs How does patient's faith help to cope with current illness?: "I haven't really thought about it like that"  Leisure/Recreation:   Leisure and Hobbies: video games, go to the gym, play basketball and soccer   Strengths/Needs:   What things does the patient do well?: math and science, basketball, good employee, funny  In what areas does patient struggle / problems for patient: time  management, procrastination, asking for help   Discharge Plan:  Does patient have access to transportation?: Yes Will patient be returning to same living situation after discharge?: Yes Currently receiving community mental health services: No If no, would patient like referral for services when discharged?: Yes (What county?) Does patient have financial barriers related to discharge medications?: No  Summary/Recommendations:     Patient is a 26 year old female with a diagnosis of Major Depressive Disorder. Pt presented to the hospital after an overdose on Tylenol. Pt reports primary trigger(s) for admission include lack of sleep and feeling overwhelmed with work and school stress. Patient will benefit from crisis stabilization, medication evaluation, group therapy and psycho education in addition to case management for discharge planning. At discharge it is recommended that Pt remain compliant with established discharge plan and continued treatment.   Maria Sampson. 02/27/2017

## 2017-02-27 NOTE — Progress Notes (Signed)
Pt rated her day a 10. Pt stated she had a great relaxed day.

## 2017-02-27 NOTE — Progress Notes (Signed)
Pt said she do not have a name during wrap up group. She do not have a goal. And she can not rate her day. Staff instruct pt she do not have to stay for wrap up group if she has nothing to say.

## 2017-02-27 NOTE — BHH Counselor (Signed)
CSW intern attempted to complete PSA, however patient declined. Patient stated " I do not want to talk to anybody, I just want to go home". CSW will continue to follow and assess.    Radonna Ricker, Student Social Work Intern  [employer]  [phone]

## 2017-02-27 NOTE — Tx Team (Signed)
Interdisciplinary Treatment and Diagnostic Plan Update  03/01/2017 Time of Session: 9:30am Maria Sampson MRN: [id number]  Principal Diagnosis: Suicide attempt by acetaminophen overdose [facility])  Secondary Diagnoses: Principal Problem:   Suicide attempt by acetaminophen overdose ([place]) Active Problems:   MDD (major depressive disorder), recurrent severe, without psychosis ([place])   Current Medications:  Current Facility-Administered Medications  Medication Dose Route Frequency Provider Last Rate Last Dose  . LORazepam (ATIVAN) tablet 0.5 mg  0.5 mg Oral Q6H PRN Myer Peer Cobos, MD      . magnesium hydroxide (MILK OF MAGNESIA) suspension 30 mL  30 mL Oral Daily PRN Benjamine Mola, FNP      . traZODone (DESYREL) tablet 50 mg  50 mg Oral QHS PRN Benjamine Mola, FNP        PTA Medications: Prescriptions Prior to Admission  Medication Sig Dispense Refill Last Dose  . polyethylene glycol (MIRALAX / GLYCOLAX) packet Take 17 g by mouth 2 (two) times daily.       Treatment Modalities: Medication Management, Group therapy, Case management,  1 to 1 session with clinician, Psychoeducation, Recreational therapy.  Patient Stressors: Educational concerns Occupational concerns  Patient Strengths: Ability for insight Capable of independent living Child psychotherapist Physical Health Work Artist for Primary Diagnosis: Suicide attempt by acetaminophen overdose ([place]) Long Term Goal(s): Improvement in symptoms so as ready for discharge  Short Term Goals: Ability to identify changes in lifestyle to reduce recurrence of condition will improve Ability to verbalize feelings will improve Ability to disclose and discuss suicidal ideas Ability to demonstrate self-control will improve Ability to identify and develop effective coping behaviors will improve Ability to maintain clinical measurements within normal limits will improve Ability to identify changes  in lifestyle to reduce recurrence of condition will improve Ability to disclose and discuss suicidal ideas  Medication Management: Evaluate patient's response, side effects, and tolerance of medication regimen.  Therapeutic Interventions: 1 to 1 sessions, Unit Group sessions and Medication administration.  Evaluation of Outcomes: Met  Physician Treatment Plan for Secondary Diagnosis: Principal Problem:   Suicide attempt by acetaminophen overdose ([place]) Active Problems:   MDD (major depressive disorder), recurrent severe, without psychosis ([place])   Long Term Goal(s): Improvement in symptoms so as ready for discharge  Short Term Goals: Ability to identify changes in lifestyle to reduce recurrence of condition will improve Ability to verbalize feelings will improve Ability to disclose and discuss suicidal ideas Ability to demonstrate self-control will improve Ability to identify and develop effective coping behaviors will improve Ability to maintain clinical measurements within normal limits will improve Ability to identify changes in lifestyle to reduce recurrence of condition will improve Ability to disclose and discuss suicidal ideas  Medication Management: Evaluate patient's response, side effects, and tolerance of medication regimen.  Therapeutic Interventions: 1 to 1 sessions, Unit Group sessions and Medication administration.  Evaluation of Outcomes: Met   RN Treatment Plan for Primary Diagnosis: Suicide attempt by acetaminophen overdose ([place]) Long Term Goal(s): Knowledge of disease and therapeutic regimen to maintain health will improve  Short Term Goals: Ability to verbalize feelings will improve, Ability to disclose and discuss suicidal ideas and Ability to identify and develop effective coping behaviors will improve  Medication Management: RN will administer medications as ordered by provider, will assess and evaluate patient's response and provide education to patient for  prescribed medication. RN will report any adverse and/or side effects to prescribing provider.  Therapeutic Interventions: 1 on 1 counseling  sessions, Psychoeducation, Medication administration, Evaluate responses to treatment, Monitor vital signs and CBGs as ordered, Perform/monitor CIWA, COWS, AIMS and Fall Risk screenings as ordered, Perform wound care treatments as ordered.  Evaluation of Outcomes: Adequate for Discharge   LCSW Treatment Plan for Primary Diagnosis: Suicide attempt by acetaminophen overdose ([place]) Long Term Goal(s): Safe transition to appropriate next level of care at discharge, Engage patient in therapeutic group addressing interpersonal concerns.  Short Term Goals: Engage patient in aftercare planning with referrals and resources, Facilitate acceptance of mental health diagnosis and concerns, Identify triggers associated with mental health/substance abuse issues and Increase skills for wellness and recovery  Therapeutic Interventions: Assess for all discharge needs, 1 to 1 time with Social worker, Explore available resources and support systems, Assess for adequacy in community support network, Educate family and significant other(s) on suicide prevention, Complete Psychosocial Assessment, Interpersonal group therapy.  Evaluation of Outcomes: Adequate for Discharge   Progress in Treatment: Attending groups: No, declines invitation Participating in groups: Limited participation when she attends Taking medication as prescribed: Yes, MD continues to assess for medication changes as needed Toleration medication: Yes, no side effects reported at this time Family/Significant other contact made: No, Pt declines Patient understands diagnosis: Limited Discussing patient identified problems/goals with staff: Yes Medical problems stabilized or resolved: Yes Denies suicidal/homicidal ideation: Yes Issues/concerns per patient self-inventory: None Other: N/A  New problem(s)  identified: None identified at this time.   New Short Term/Long Term Goal(s): None identified at this time.   Discharge Plan or Barriers: CSW will assess for appropriate therapy referrals; Pt will return home 4/13:  Linked for therapy w [facility].  States she will return home and to work.    Reason for Continuation of Hospitalization: Anxiety Depression Medication stabilization  Estimated Length of Stay: 2-3 days  Attendees: Patient: 03/01/2017  12:36 PM  Physician: Dr. Parke Poisson 03/01/2017  12:36 PM  Nursing: Kieth Brightly RN, Patty RNOpal Sidles RN 03/01/2017  12:36 PM  RN Care Manager: Lars Pinks, RN 03/01/2017  12:36 PM  Social Worker: Edwyna Shell LCSW 03/01/2017  12:36 PM  Recreational Therapist:  03/01/2017  12:36 PM  Other: Lindell Spar, NP; 03/01/2017  12:36 PM  Other:  03/01/2017  12:36 PM  Other: 03/01/2017  12:36 PM    Scribe for Treatment Team: Edwyna Shell, LCSW 03/01/2017 12:36 PM

## 2017-02-27 NOTE — BHH Group Notes (Signed)
[facility] LCSW Group Therapy 02/27/2017 1:15 PM  Type of Therapy: Group Therapy- Emotion Regulation  Participation Level: Pt was present for the entire group. Did not participate in discussion.   Georga Kaufmann, MSW, LCSWA 02/27/2017 3:18 PM

## 2017-02-27 NOTE — BHH Group Notes (Signed)
[facility] LCSW Aftercare Discharge Planning Group Note   02/27/2017  8:45 AM  Participation Quality: Pt invited. Did not attend.    Georga Kaufmann, MSW, LCSWA 02/27/2017 11:33 AM

## 2017-02-27 NOTE — BHH Suicide Risk Assessment (Signed)
[facility] Admission Suicide Risk Assessment   Nursing information obtained from:   patient and chart  Demographic factors:   26 year old single female, employed, in grad school Current Mental Status:   see below Loss Factors:   busy lifestyle, trying to reconcile work and academic  schedules Historical Factors:    denies prior psychiatric admissions or suicidal attempts  Risk Reduction Factors:   resilience   Total Time spent with patient: 45 minutes Principal Problem: S/P suicide attempt by Acetaminophen Overdose  Diagnosis:   Patient Active Problem List   Diagnosis Date Noted  . Constipation [K59.00] 02/26/2017  . MDD (major depressive disorder), recurrent severe, without psychosis ([place]) [F33.2] 02/26/2017  . Suicide attempt [T14.91XA] 02/20/2017  . Tylenol overdose, intentional self-harm, initial encounter ([place]) [T39.1X2A] 02/20/2017  . Mild tetrahydrocannabinol (THC) abuse [F12.10] 02/20/2017  . BPV (benign positional vertigo) [H81.10] 10/27/2015  . Malnutrition of moderate degree [E44.0] 10/26/2015  . Abdominal pain [R10.9] 10/23/2015  . Nausea & vomiting [R11.2] 10/23/2015  . Sepsis ([place]) [A41.9] 10/23/2015  . Fibroid, uterine [D25.9] 10/23/2015  . Ileus ([place]) [K56.7] 10/23/2015  . Normocytic anemia [D64.9] 10/23/2015  . Overdose [T50.901A] 06/03/2014  . Depressive disorder [F32.9] 06/03/2014  . Suicidal ideation [R45.851] 06/03/2014  . Otitis externa due to Pseudomonas aeruginosa [B96.5, H62.40] 05/03/2014    Continued Clinical Symptoms:  Alcohol Use Disorder Identification Test Final Score (AUDIT): 0 The "Alcohol Use Disorders Identification Test", Guidelines for Use in Primary Care, Second Edition.  World Pharmacologist [facility]). Score between 0-7:  no or low risk or alcohol related problems. Score between 8-15:  moderate risk of alcohol related problems. Score between 16-19:  high risk of alcohol related problems. Score 20 or above:  warrants further diagnostic  evaluation for alcohol dependence and treatment.   CLINICAL FACTORS:  26 year old female, admitted to ED/Medical Services following serious overdose on Acetaminophen requiring N Acetyl- Cysteine and resulting in elevated transaminases. At this time patient minimizes depression, tends to minimize severity of suicide attempt, and is focused on discharge soon, stating feeling better and needing to return to her daily activities. Presents irritable today, which she attributes to still being in the hospital .   Psychiatric Specialty Exam: Physical Exam  ROS  Blood pressure 125/68, pulse (!) 56, temperature 98.4 F (36.9 C), temperature source Oral, resp. rate 18, height 5\' 11"  (1.803 m), weight 69.9 kg (154 lb), last menstrual period 01/29/2017.Body mass index is 21.48 kg/m.   see admit note MSE     COGNITIVE FEATURES THAT CONTRIBUTE TO RISK:  No gross cognitive deficits noted upon discharge. Is alert , attentive, and oriented x 3     SUICIDE RISK:   Mild:  Suicidal ideation of limited frequency, intensity, duration, and specificity.  There are no identifiable plans, no associated intent, mild dysphoria and related symptoms, good self-control (both objective and subjective assessment), few other risk factors, and identifiable protective factors, including available and accessible social support.  PLAN OF CARE: Patient will be admitted to inpatient psychiatric unit for stabilization and safety. Will provide and encourage milieu participation. Provide medication management and maked adjustments as needed.  Will follow daily.    I certify that inpatient services furnished can reasonably be expected to improve the patient's condition.   Jenne Campus, MD 02/27/2017, 3:18 PM

## 2017-02-27 NOTE — BHH Suicide Risk Assessment (Signed)
[facility] INPATIENT:  Family/Significant Other Suicide Prevention Education  Suicide Prevention Education:  Patient Refusal for Family/Significant Other Suicide Prevention Education: The patient Maria Sampson has refused to provide written consent for family/significant other to be provided Family/Significant Other Suicide Prevention Education during admission and/or prior to discharge.  Physician notified.  Gladstone Lighter 02/27/2017, 4:48 PM

## 2017-02-27 NOTE — Progress Notes (Signed)
Patient ID: Maria Sampson, female   DOB: 04-25-1991, 26 y.o.   MRN: [id number]  Patient signed voluntary paperwork per MD Cobos suggestion and signed a 72 hour request for discharge on 02/27/17 at 1557. MD Cobos is aware.

## 2017-02-28 LAB — HEPATIC FUNCTION PANEL
ALK PHOS: 63 U/L (ref 38–126)
ALT: 50 U/L (ref 14–54)
AST: 27 U/L (ref 15–41)
Albumin: 4.7 g/dL (ref 3.5–5.0)
BILIRUBIN TOTAL: 1 mg/dL (ref 0.3–1.2)
Bilirubin, Direct: <0.1 mg/dL — ABNORMAL LOW (ref 0.1–0.5)
Total Protein: 7.6 g/dL (ref 6.5–8.1)

## 2017-02-28 LAB — TSH: TSH: 1.624 u[IU]/mL (ref 0.350–4.500)

## 2017-02-28 MED ORDER — MAGNESIUM CITRATE PO SOLN
1.0000 | Freq: Once | ORAL | Status: AC
  Administered 2017-02-28: 1 via ORAL

## 2017-02-28 NOTE — Progress Notes (Signed)
D: Patient denies any depressive symptoms or thoughts of self harm.  She is pleasant and is interacting well with her peers.  Patient's main complaint today is constipation.  Mag-Citrate was ordered per NP.  She denies HI and is not responding to internal stimuli.  She rates her depression, hopelessness and anxiety as a 0.  She is sleeping good; appetite is good; her energy level is normal and her concentration is good.  Her goal is to "participate and be able to leave."  Patient has signed 72 hour discharge. A: Continue to monitor medication management and MD orders.  Safety checks completed every 15 minutes per protocol.  Offer support and encouragement as needed. R: Patient is receptive to staff; her behavior is appropriate.

## 2017-02-28 NOTE — Progress Notes (Signed)
Nursing Progress Note 7p-7a  D) Patient presents anxious but is cooperative with Probation officer. Patient is seen interactive in the milieu.  Patient denies SI/HI/AVH. Patient contracts for safety on the unit. Patient did attend group. Patient states she has not had a bowel movement but denies abdominal pain. Patient complains of R lower calf pain that "feels like a muscle spasm, and is tight. It hurts worse when I walk". No redness, swelling or numbness note to R lower extremity. Pedal pulse and posterior tibialis present +2. Patient provided ice pack and instructed to rest and elevate extremity. NP notified and new orders received for AM lab draw. Patient denied need for medication this evening.  A) Emotional support given. 1:1 interaction and active listening provided. No medications order for this shift. Snacks and fluids provided. Opportunities for questions or concerns presented to patient. Patient encouraged to continue to work on treatment goals. Labs, vital signs and patient behavior monitored throughout shift. Patient safety maintained with q15 min safety checks.  R) Patient receptive to interaction with nurse. Patient remains safe on the unit at this time. Patient denies any adverse medication reactions at this time. Patient is resting in bed. Will continue to monitor.

## 2017-02-28 NOTE — BHH Group Notes (Signed)
[facility] Mental [facility] Association Group Therapy 02/28/2017 1:15pm  Type of Therapy: Mental Health Association Presentation  Participation Level: Pt invited. Did not attend.   Kara Mead. Marshell Levan, MSW, [facility] 02/28/2017 3:55 PM

## 2017-02-28 NOTE — Progress Notes (Signed)
Pt attend wrap up group. Her day was a 10. Her goal was to get Joellen Jersey and Caryl Pina to play with when they went outside ant they did.

## 2017-02-28 NOTE — Plan of Care (Signed)
Problem: Activity: Goal: Sleeping patterns will improve Outcome: Progressing Patient resting in bed with eyes closed. Respirations even and unlabored.

## 2017-02-28 NOTE — Plan of Care (Signed)
Problem: Safety: Goal: Periods of time without injury will increase Outcome: Progressing Client is safe on the unit AEB q84min safety checks. Client denies SHI, contracts for safety.

## 2017-02-28 NOTE — Progress Notes (Signed)
[facility] MD Progress Note  02/28/2017 4:09 PM Maria Sampson  MRN:  [id number] Subjective:  Patient states " I feel OK". She denies feeling depressed , denies any current suicidal or self injurious ideations, and presents future oriented, stating she has decided to cut down her work hours from 60 per week to 35 per week, which will help her feel less overwhelmed and allow her more time for herself . Objective : I have discussed case with treatment team and have met with patient . Patient presents euthymic, with full range of affect . She denies any neuro-vegetative symptoms of depression, reports normal appetite, sleep, energy level . She denies any suicidal ideations, and as above, is currently future oriented, stating she is going to reduce her work hours and also looking forward to having more free time after she completes grad school semester in May. She is visible on unit, interacting with peers, noted to be laughing and conversing with other patients in day room. Patient not currently on any standing psychiatric medication . States " I don't need to be on any, I feel fine". Labs reviewed as below- LFTs normalized .   Principal Problem: Suicide attempt by acetaminophen overdose [facility]) Diagnosis:   Patient Active Problem List   Diagnosis Date Noted  . Suicide attempt by acetaminophen overdose ([place]) [T39.1X2A] 02/20/2017    Priority: High  . Constipation [K59.00] 02/26/2017  . MDD (major depressive disorder), recurrent severe, without psychosis ([place]) [F33.2] 02/26/2017  . Suicide attempt [T14.91XA] 02/20/2017  . Mild tetrahydrocannabinol (THC) abuse [F12.10] 02/20/2017  . BPV (benign positional vertigo) [H81.10] 10/27/2015  . Malnutrition of moderate degree [E44.0] 10/26/2015  . Abdominal pain [R10.9] 10/23/2015  . Nausea & vomiting [R11.2] 10/23/2015  . Sepsis ([place]) [A41.9] 10/23/2015  . Fibroid, uterine [D25.9] 10/23/2015  . Ileus ([place]) [K56.7] 10/23/2015  . Normocytic anemia [D64.9]  10/23/2015  . Overdose [T50.901A] 06/03/2014  . Depressive disorder [F32.9] 06/03/2014  . Suicidal ideation [R45.851] 06/03/2014  . Otitis externa due to Pseudomonas aeruginosa [B96.5, H62.40] 05/03/2014   Total Time spent with patient: 20 minutes  Past Medical History:  Past Medical History:  Diagnosis Date  . Depression   . Otitis externa due to Pseudomonas aeruginosa 04/2014    Past Surgical History:  Procedure Laterality Date  . DENTAL EXAMINATION UNDER ANESTHESIA     Family History:  Family History  Problem Relation Age of Onset  . Crohn's disease Other    Social History:  History  Alcohol Use  . Yes    Comment: social     History  Drug Use  . Types: Marijuana    Social History   Social History  . Marital status: Single    Spouse name: N/A  . Number of children: N/A  . Years of education: N/A   Social History Main Topics  . Smoking status: Current Every Day Smoker    Types: Cigarettes  . Smokeless tobacco: Never Used  . Alcohol use Yes     Comment: social  . Drug use: Yes    Types: Marijuana  . Sexual activity: Yes    Birth control/ protection: Condom   Other Topics Concern  . None   Social History Narrative  . None   Additional Social History:   Sleep: Fair  Appetite:  Good  Current Medications: Current Facility-Administered Medications  Medication Dose Route Frequency Provider Last Rate Last Dose  . LORazepam (ATIVAN) tablet 0.5 mg  0.5 mg Oral Q6H PRN Jenne Campus, MD      .  magnesium hydroxide (MILK OF MAGNESIA) suspension 30 mL  30 mL Oral Daily PRN Benjamine Mola, FNP      . traZODone (DESYREL) tablet 50 mg  50 mg Oral QHS PRN Benjamine Mola, FNP        Lab Results:  Results for orders placed or performed during the hospital encounter of 02/26/17 (from the past 48 hour(s))  TSH     Status: None   Collection Time: 02/28/17  6:15 AM  Result Value Ref Range   TSH 1.624 0.350 - 4.500 uIU/mL    Comment: Performed by a 3rd  Generation assay with a functional sensitivity of <=0.01 uIU/mL. Performed at [facility], [place] [address], [place], [place] [address]   Hepatic function panel     Status: Abnormal   Collection Time: 02/28/17  6:15 AM  Result Value Ref Range   Total Protein 7.6 6.5 - 8.1 g/dL   Albumin 4.7 3.5 - 5.0 g/dL   AST 27 15 - 41 U/L   ALT 50 14 - 54 U/L   Alkaline Phosphatase 63 38 - 126 U/L   Total Bilirubin 1.0 0.3 - 1.2 mg/dL   Bilirubin, Direct <0.1 (L) 0.1 - 0.5 mg/dL   Indirect Bilirubin NOT CALCULATED 0.3 - 0.9 mg/dL    Comment: Performed at [facility], [place] [address], [place], [place] [address]    Blood Alcohol level:  Lab Results  Component Value Date   [facility] <5 02/20/2017   ETH <11 04/59/9774    Metabolic Disorder Labs: No results found for: HGBA1C, MPG No results found for: PROLACTIN Lab Results  Component Value Date   TRIG 13 10/29/2008    Physical Findings: AIMS: Facial and Oral Movements Muscles of Facial Expression: None, normal Lips and Perioral Area: None, normal Jaw: None, normal Tongue: None, normal,Extremity Movements Upper (arms, wrists, hands, fingers): None, normal Lower (legs, knees, ankles, toes): None, normal, Trunk Movements Neck, shoulders, hips: None, normal, Overall Severity Severity of abnormal movements (highest score from questions above): None, normal Incapacitation due to abnormal movements: None, normal Patient's awareness of abnormal movements (rate only patient's report): No Awareness, Dental Status Current problems with teeth and/or dentures?: No Does patient usually wear dentures?: No  CIWA:    COWS:     Musculoskeletal: Strength & Muscle Tone: within normal limits Gait & Station: normal Patient leans: N/A  Psychiatric Specialty Exam: Physical Exam  ROS denies headache, no chest pain, no shortness of breath, no vomiting   Blood pressure 110/82, pulse 71, temperature 98.3 F (36.8 C),  temperature source Oral, resp. rate 16, height _0  (1.803 m), weight 69.9 kg (154 lb), last menstrual period 01/29/2017.Body mass index is 21.48 kg/m.  General Appearance: Fairly Groomed  Eye Contact:  Good  Speech:  Normal Rate  Volume:  Normal  Mood:  denies depression and presents euthymic   Affect:  Appropriate and Full Range  Thought Process:  Linear and Descriptions of Associations: Intact  Orientation:  Full (Time, Place, and Person)  Thought Content:  denies any hallucinations,no delusions not internally preoccupied   Suicidal Thoughts:  No denies any suicidal or self injurious ideations, denies any homicidal or violent ideations   Homicidal Thoughts:  No  Memory:  recent and remote grossly intact   Judgement:  Other:  improving   Insight:  Fair- improving  Psychomotor Activity:  Normal  Concentration:  Concentration: Good and Attention Span: Good  Recall:  Good  Fund of Knowledge:  Good  Language:  Good  Akathisia:  Negative  Handed:  Right  AIMS (if indicated):     Assets:  Communication Skills Desire for Improvement Resilience  ADL's:  Intact  Cognition:  WNL  Sleep:  Number of Hours: 4.5   Assessment - patient reports feeling well, denies any depression or neuro-vegetative symptoms of depression at this time, and presents sociable, interactive with peers, and euthymic. She denies any SI. She is taking steps to decrease stressors, such as planning to cut work hours in order to have less stress and more time to self . She is not currently on any standing psychiatric medications   Treatment Plan Summary: Daily contact with patient to assess and evaluate symptoms and progress in treatment, Medication management, Plan inpatient admission  and medications as below Encourage ongoing group and milieu participation to work on coping skills and symptom reduction Continue Ativan 0.5 mgrs Q 6 hours PRN for anxiety  Continue Trazodone 50 mgrs QHS PRN for insomnia Treatment  team working on disposition planning options Jenne Campus, MD 02/28/2017, 4:09 PM

## 2017-03-01 LAB — COMPREHENSIVE METABOLIC PANEL
ALBUMIN: 4.1 g/dL (ref 3.5–5.0)
ALT: 39 U/L (ref 14–54)
AST: 26 U/L (ref 15–41)
Alkaline Phosphatase: 58 U/L (ref 38–126)
Anion gap: 7 (ref 5–15)
BILIRUBIN TOTAL: 0.7 mg/dL (ref 0.3–1.2)
BUN: 15 mg/dL (ref 6–20)
CHLORIDE: 104 mmol/L (ref 101–111)
CO2: 26 mmol/L (ref 22–32)
Calcium: 9.4 mg/dL (ref 8.9–10.3)
Creatinine, Ser: 0.73 mg/dL (ref 0.44–1.00)
GFR calc Af Amer: >60 mL/min (ref >60)
GFR calc non Af Amer: >60 mL/min (ref >60)
GLUCOSE: 85 mg/dL (ref 65–99)
POTASSIUM: 4.2 mmol/L (ref 3.5–5.1)
Sodium: 137 mmol/L (ref 135–145)
Total Protein: 6.7 g/dL (ref 6.5–8.1)

## 2017-03-01 MED ORDER — TRAZODONE HCL 50 MG PO TABS: 50.0000 mg | ORAL_TABLET | Freq: Every evening | ORAL | 0 refills | Status: DC | PRN

## 2017-03-01 NOTE — BHH Suicide Risk Assessment (Signed)
[facility] Discharge Suicide Risk Assessment   Principal Problem: Suicide attempt by acetaminophen overdose [facility]) Discharge Diagnoses:  Patient Active Problem List   Diagnosis Date Noted  . Suicide attempt by acetaminophen overdose ([place]) [T39.1X2A] 02/20/2017    Priority: High  . Constipation [K59.00] 02/26/2017  . MDD (major depressive disorder), recurrent severe, without psychosis ([place]) [F33.2] 02/26/2017  . Suicide attempt [T14.91XA] 02/20/2017  . Mild tetrahydrocannabinol (THC) abuse [F12.10] 02/20/2017  . BPV (benign positional vertigo) [H81.10] 10/27/2015  . Malnutrition of moderate degree [E44.0] 10/26/2015  . Abdominal pain [R10.9] 10/23/2015  . Nausea & vomiting [R11.2] 10/23/2015  . Sepsis ([place]) [A41.9] 10/23/2015  . Fibroid, uterine [D25.9] 10/23/2015  . Ileus ([place]) [K56.7] 10/23/2015  . Normocytic anemia [D64.9] 10/23/2015  . Overdose [T50.901A] 06/03/2014  . Depressive disorder [F32.9] 06/03/2014  . Suicidal ideation [R45.851] 06/03/2014  . Otitis externa due to Pseudomonas aeruginosa [B96.5, H62.40] 05/03/2014    Total Time spent with patient: 30 minutes  Musculoskeletal: Strength & Muscle Tone: within normal limits Gait & Station: normal Patient leans: N/A  Psychiatric Specialty Exam: ROS describes some diffuse muscular aches, discomfort which she attributes to not being as physically active since she has been in hospital, no nausea, no vomiting, no fever, no rash   Blood pressure 129/83, pulse 75, temperature 98.4 F (36.9 C), temperature source Oral, resp. rate 16, height 5\' 11"  (1.803 m), weight 69.9 kg (154 lb).Body mass index is 21.48 kg/m.  General Appearance: improved grooming   Eye Contact::  Good  Speech:  Normal Rate409  Volume:  Normal  Mood:  improving mood , currently euthymic   Affect:  Appropriate and Full Range  Thought Process:  Linear  Orientation:  Full (Time, Place, and Person)  Thought Content:  denies hallucinations, no delusions, not  internally preoccupied   Suicidal Thoughts:  No denies any suicidal or self injurious ideations, denies any homicidal or violent ideations  Homicidal Thoughts:  No  Memory:  recent and remote grossly intact   Judgement:  Other:  improving   Insight:  improving   Psychomotor Activity:  Normal  Concentration:  Good  Recall:  Good  Fund of Knowledge:Good  Language: Good  Akathisia:  Negative  Handed:  Right  AIMS (if indicated):     Assets:  Desire for Improvement Resilience  Sleep:  Number of Hours: 3  Cognition: WNL  ADL's:  Intact   Mental Status Per Nursing Assessment::   On Admission:     Demographic Factors:  26 year old single female, employed   Loss Factors: Very busy schedule, working 60 hours a week in addition to going to grad school  Historical Factors: No prior psychiatric admissions, no prior history of suicide attempts or of self injurious behaviors   Risk Reduction Factors:   Employed and Positive coping skills or problem solving skills  Continued Clinical Symptoms:  At this time patient is alert, attentive, well related, pleasant, mood is improved and presents euthymic at this time, affect is reactive, full in range, no thought disorder, no suicidal or self injurious ideations, no psychotic symptoms, future oriented . Behavior on unit calm, interacting appropriately with peers, pleasant on approach . Patient has gained insight since admission- states " I know I have to take better care of myself", plans to cut down on work hours, and states she has good friends she trusts that she can talk to if she feels tired or upset, " instead of holding it all in".   Cognitive Features That  Contribute To Risk:  No gross cognitive deficits noted upon discharge. Is alert , attentive, and oriented x 3    Suicide Risk:  Mild   Follow-up [facility] Follow up on 03/06/2017.   Specialty:  Behavioral Health Why:  Initial appointment  for therapy on 4/18 at 2 PM.  Please call to cancel/reschedule if needed.   Contact information: [address], [place] [address] [phone]           Plan Of Care/Follow-up recommendations:  Activity:  inpatient treatment  Diet:  Regular Tests:  NA Other:  see below Patient is leaving unit in good spirits . She plans to return home She is looking forward to returning to her work as of next week She is not on any standing psychiatric medications at discharge. Jenne Campus, MD 03/01/2017, 1:43 PM

## 2017-03-01 NOTE — Discharge Summary (Signed)
Physician Discharge Summary Note  Patient:  Maria Sampson is an 26 y.o., female MRN:  [id number] DOB:  05-31-1991 Patient phone:  [phone] (home)  Patient address:   [address] [place] [address],  Total Time spent with patient: Greater than 30 minutes  Date of Admission:  02/26/2017 Date of Discharge: 03-01-17  Reason for Admission: Suicide attempt by acetaminophen overdose [facility])  Principal Problem: Major depressive disorder, recurrent episodes without psychotic features.  Discharge Diagnoses: Patient Active Problem List   Diagnosis Date Noted  . Constipation [K59.00] 02/26/2017  . MDD (major depressive disorder), recurrent severe, without psychosis ([place]) [F33.2] 02/26/2017  . Suicide attempt [T14.91XA] 02/20/2017  . Suicide attempt by acetaminophen overdose ([place]) [T39.1X2A] 02/20/2017  . Mild tetrahydrocannabinol (THC) abuse [F12.10] 02/20/2017  . BPV (benign positional vertigo) [H81.10] 10/27/2015  . Malnutrition of moderate degree [E44.0] 10/26/2015  . Abdominal pain [R10.9] 10/23/2015  . Nausea & vomiting [R11.2] 10/23/2015  . Sepsis ([place]) [A41.9] 10/23/2015  . Fibroid, uterine [D25.9] 10/23/2015  . Ileus ([place]) [K56.7] 10/23/2015  . Normocytic anemia [D64.9] 10/23/2015  . Overdose [T50.901A] 06/03/2014  . Depressive disorder [F32.9] 06/03/2014  . Suicidal ideation [R45.851] 06/03/2014  . Otitis externa due to Pseudomonas aeruginosa [B96.5, H62.40] 05/03/2014   Past Psychiatric History: Suicide attempt by overdose, Major depressive disorder.  Past Medical History:  Past Medical History:  Diagnosis Date  . Depression   . Otitis externa due to Pseudomonas aeruginosa 04/2014    Past Surgical History:  Procedure Laterality Date  . DENTAL EXAMINATION UNDER ANESTHESIA     Family History:  Family History  Problem Relation Age of Onset  . Crohn's disease Other    Family Psychiatric  History: See H&P  Social History:  History  Alcohol Use  . Yes     Comment: social     History  Drug Use  . Types: Marijuana    Social History   Social History  . Marital status: Single    Spouse name: N/A  . Number of children: N/A  . Years of education: N/A   Social History Main Topics  . Smoking status: Current Every Day Smoker    Types: Cigarettes  . Smokeless tobacco: Never Used  . Alcohol use Yes     Comment: social  . Drug use: Yes    Types: Marijuana  . Sexual activity: Yes    Birth control/ protection: Condom   Other Topics Concern  . None   Social History Narrative  . None   Hospital Course: 26 year old female , presented to ED via EMS after suicide attempt by overdosing on Acetaminophen and Nyquill. Initial admission Acetaminophen serum  level was 210, requiring N Acetyl- Cysteine management. LFTs became elevated, requiring initial medical management and stabilization. As per chart notes, patient presented as fair historian, refusing to provide much information, but acknowledging overdose and feeling overwhelmed due to being extremely busy (grad school, employment). Upon medical clearance she was admitted to [facility]. At this time patient minimizes depression and minimizes recent overdose- states " I did not want to die, I was just upset ", and states she knew that the dose she took would not cause any lasting damage . She also minimizes depression or neuro-vegetative symptoms leading up to admission, endorsing only anxiety, feeling overwhelmed due to how busy she was. At this time focused on being unhappy about being in hospital, stating " I feel like I am here under false pretenses, I told them I was  not suicidal ".  Laryah's stay in this hospital was rather very brief. She was admitted to the [facility] adult unit for attempt by overdose on Tylenol tablets & Nyquil. She cited school related stressors as the trigger. She was admitted to the [facility] hospital for crisis management after she was medically cleared at the [facility]. After  her admission assessment, Quaneisha was started on Trazodone 50 mg for insomnia. She presented no other significant health issues that required treatment. She was also enrolled in the group counseling sessions being offered & held on thi unit to learn coping skills that should help her to maintain mood stability after discharge.   During her follow-up care assessment this morning, Jeiry presented with a good affect, good eye contact, is alert & oriented x 3. She is aware of situation & able to make concrete decisions/requests. She presented no significant pre-existing medical issues that required treatment or monitoring. She has asked to be discharged today. She presents mentally & medically stable. She denies any new issues or symptoms. She says her overdose incident was an impulsive behavior rather than a planned incident because she new the amount of medicines she took would not leave her with a permanent damage. She denies any symptoms of depression at that time. She denies any hx of prior suicide attempt. She denies any familial hx of suicide. And because there is no clinical criteria to keep [facility] admitted to the hospital, she is being discharged as requested to her place of residence. She is committed to receiving mental health care on an outpatient basis as noted below.  Upon discharge, Charley appears much more in control of her mood & behavior. Her symptoms were reported as significantly improved or completely resolved There are currently, no active SI thoughts, plans or intent. She denies Hi. She denies AVH, delusional thoughts or paranoia. She is going to pursue outpatient treatment in her own terms. She was provided with prescription for Trazodone. She left [facility] with all personal belongings in no apparent distress. Transportation per family/friend.  Physical Findings: AIMS: Facial and Oral Movements Muscles of Facial Expression: None, normal Lips and Perioral Area: None, normal Jaw: None,  normal Tongue: None, normal,Extremity Movements Upper (arms, wrists, hands, fingers): None, normal Lower (legs, knees, ankles, toes): None, normal, Trunk Movements Neck, shoulders, hips: None, normal, Overall Severity Severity of abnormal movements (highest score from questions above): None, normal Incapacitation due to abnormal movements: None, normal Patient's awareness of abnormal movements (rate only patient's report): No Awareness, Dental Status Current problems with teeth and/or dentures?: No Does patient usually wear dentures?: No  CIWA:    COWS:     Musculoskeletal: Strength & Muscle Tone: within normal limits Gait & Station: normal Patient leans: N/A  Psychiatric Specialty Exam: Physical Exam  Constitutional: She appears well-developed.  HENT:  Head: Normocephalic.  Eyes: Pupils are equal, round, and reactive to light.  Neck: Normal range of motion.  Cardiovascular: Normal rate.   GI: Soft.  Genitourinary:  Genitourinary Comments: Deferred  Musculoskeletal: Normal range of motion.  Neurological: She is alert.  Skin: Skin is warm.    Review of Systems  Constitutional: Negative.   HENT: Negative.   Eyes: Negative.   Respiratory: Negative.   Cardiovascular: Negative.   Gastrointestinal: Negative.   Genitourinary: Negative.   Musculoskeletal: Negative.   Skin: Negative.   Neurological: Negative.   Endo/Heme/Allergies: Negative.   Psychiatric/Behavioral: Positive for depression (Stable) and substance abuse (Hx. Cannabis use disorder). Negative for hallucinations, memory loss  and suicidal ideas. The patient has insomnia (Stable). The patient is not nervous/anxious.     Blood pressure 129/83, pulse 75, temperature 98.4 F (36.9 C), temperature source Oral, resp. rate 16, height 5\' 11"  (1.803 m), weight 69.9 kg (154 lb).Body mass index is 21.48 kg/m.  See Md's SRA   Have you used any form of tobacco in the last 30 days? (Cigarettes, Smokeless Tobacco, Cigars,  and/or Pipes): Yes  Has this patient used any form of tobacco in the last 30 days? (Cigarettes, Smokeless Tobacco, Cigars, and/or Pipes): No  Blood Alcohol level:  Lab Results  Component Value Date   ETH <5 02/20/2017   ETH <11 58/83/2549   Metabolic Disorder Labs:  No results found for: HGBA1C, MPG No results found for: PROLACTIN Lab Results  Component Value Date   TRIG 13 10/29/2008   See Psychiatric Specialty Exam and Suicide Risk Assessment completed by Attending Physician prior to discharge.  Discharge destination:  Home  Is patient on multiple antipsychotic therapies at discharge:  No   Has Patient had three or more failed trials of antipsychotic monotherapy by history:  No  Recommended Plan for Multiple Antipsychotic Therapies: NA  Allergies as of 03/01/2017      Reactions   Peanut-containing Drug Products Anaphylaxis, Hives   Food Hives, Itching, Swelling   PEANUTS      Medication List    STOP taking these medications   polyethylene glycol packet Commonly known as:  MIRALAX / GLYCOLAX     TAKE these medications     Indication  traZODone 50 MG tablet Commonly known as:  DESYREL Take 1 tablet (50 mg total) by mouth at bedtime as needed for sleep.  Indication:  Trouble Sleeping      Follow-up Information    [facility] Follow up.   Specialty:  Behavioral Health Why:  Initial appointment for therapy on  Contact information: [address], [place] [address] [phone]          Follow-up recommendations: Activity:  As tolerated Diet: As recommended by your primary care doctor. Keep all scheduled follow-up appointments as recommended.   Comments: Patient is instructed prior to discharge to: Take all medications as prescribed by his/her mental healthcare provider. Report any adverse effects and or reactions from the medicines to his/her outpatient provider promptly. Patient has been instructed &  cautioned: To not engage in alcohol and or illegal drug use while on prescription medicines. In the event of worsening symptoms, patient is instructed to call the crisis hotline, 911 and or go to the nearest ED for appropriate evaluation and treatment of symptoms. To follow-up with his/her primary care provider for your other medical issues, concerns and or health care needs.   Signed: Encarnacion Slates, NP, PMHNP, FNP-BC 03/01/2017, 11:04 AM

## 2017-03-01 NOTE — Progress Notes (Signed)
Recreation Therapy Notes  Date: 03/01/17 Time: 0930 Location: 300 Hall Dayroom  Group Topic: Stress Management  Goal Area(s) Addresses:  Patient will verbalize importance of using healthy stress management.  Patient will identify positive emotions associated with healthy stress management.   Intervention: Stress Management  Activity :  [place].  LRT introduced the stress management technique of guided imagery.  LRT read a script to allow patients to engage in the activity.  Patients were to follow along as LRT read the script to participate in activity.  Education:  Stress Management, Discharge Planning.   Education Outcome: Acknowledges edcuation/In group clarification offered/Needs additional education  Clinical Observations/Feedback: Pt did not attend group.   Victorino Sparrow, LRT/CTRS         Victorino Sparrow A 03/01/2017 12:06 PM

## 2017-03-01 NOTE — BHH Group Notes (Signed)
[facility] LCSW Aftercare Discharge Planning Group Note   03/01/2017 11:58 AM  Participation Quality:  Invited, did not attend.  Beverely Pace

## 2017-03-01 NOTE — Progress Notes (Signed)
Data. Patient denies SI/HI/AVH. Patient interacting well with staff and other patients. Affect brightens with interaction. Patient states about her Tylenol OD, "I wasn't trying to kill myself. I was just trying to get to sleep. I would pop a couple of Tylenol PM and then in a little while, when I wasn't asleep yet, I'd pop a few more. I took myself to the doctor when I started to not feel well. I wouldn't do that. I have a lot to live for. 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 Emotional support and encouragement offered. Education provided on medication, indications and side effect. Q 15 minute checks done for safety. Response. Safety on the unit maintained through 15 minute checks.  Medications taken as prescribed. Attended groups. Remained calm and appropriate through out shift.  Pt. discharged to lobby. Belongings sheet reviewed and signed by pt. and all belongings, including scripts and sample medications sent home. Paperwork reviewed and pt. able to verbalize understanding of education. Pt. in no current distress and ambulatory.

## 2017-03-01 NOTE — Progress Notes (Signed)
  [facility] Adult Case Management Discharge Plan :  Will you be returning to the same living situation after discharge:  Yes,  return home At discharge, do you have transportation home?: Yes,  friends/family; can be given bus pass if needed Do you have the ability to pay for your medications: Yes,  not on medication at discharge  Release of information consent forms completed and in the chart;  Patient's signature needed at discharge.  Patient to Follow up at: Follow-up Information    [facility] Follow up on 03/06/2017.   Specialty:  Behavioral Health Why:  Initial appointment for therapy on 4/18 at 2 PM.  Please call to cancel/reschedule if needed.   Contact information: [place], [place] [address] [phone]           Next level of care provider has access to [place] and Suicide Prevention discussed: No. Patient declined, given SPE brochure and group education  Have you used any form of tobacco in the last 30 days? (Cigarettes, Smokeless Tobacco, Cigars, and/or Pipes): Yes  Has patient been referred to the Quitline?: Patient refused referral  Patient has been referred for addiction treatment: Yes  Beverely Pace 03/01/2017, 11:58 AM

## 2017-03-30 ENCOUNTER — Emergency Department (HOSPITAL_COMMUNITY): Payer: Self-pay

## 2017-03-30 ENCOUNTER — Encounter (HOSPITAL_COMMUNITY): Payer: Self-pay | Admitting: Emergency Medicine

## 2017-03-30 ENCOUNTER — Emergency Department (HOSPITAL_COMMUNITY)
Admission: EM | Admit: 2017-03-30 | Discharge: 2017-03-31 | Disposition: A | Discharge status: Home / Self Care | Payer: Self-pay | Attending: Emergency Medicine | Admitting: Emergency Medicine

## 2017-03-30 ENCOUNTER — Emergency Department (HOSPITAL_COMMUNITY)
Admission: EM | Admit: 2017-03-30 | Discharge: 2017-03-30 | Disposition: A | Discharge status: Home / Self Care | Payer: Self-pay | Attending: Emergency Medicine | Admitting: Emergency Medicine

## 2017-03-30 DIAGNOSIS — F17210 Nicotine dependence, cigarettes, uncomplicated: Secondary | ICD-10-CM | POA: Insufficient documentation

## 2017-03-30 DIAGNOSIS — Z91010 Allergy to peanuts: Secondary | ICD-10-CM | POA: Insufficient documentation

## 2017-03-30 DIAGNOSIS — R1011 Right upper quadrant pain: Secondary | ICD-10-CM | POA: Insufficient documentation

## 2017-03-30 DIAGNOSIS — R51 Headache: Secondary | ICD-10-CM | POA: Insufficient documentation

## 2017-03-30 DIAGNOSIS — H9202 Otalgia, left ear: Secondary | ICD-10-CM | POA: Insufficient documentation

## 2017-03-30 DIAGNOSIS — G44319 Acute post-traumatic headache, not intractable: Secondary | ICD-10-CM | POA: Insufficient documentation

## 2017-03-30 DIAGNOSIS — Z79899 Other long term (current) drug therapy: Secondary | ICD-10-CM | POA: Insufficient documentation

## 2017-03-30 DIAGNOSIS — F17290 Nicotine dependence, other tobacco product, uncomplicated: Secondary | ICD-10-CM | POA: Insufficient documentation

## 2017-03-30 DIAGNOSIS — R42 Dizziness and giddiness: Secondary | ICD-10-CM | POA: Insufficient documentation

## 2017-03-30 LAB — CBC WITH DIFFERENTIAL/PLATELET
Basophils Absolute: 0 10*3/uL (ref 0.0–0.1)
Basophils Relative: 0 %
EOS ABS: 0.2 10*3/uL (ref 0.0–0.7)
EOS PCT: 2 %
HCT: 32.2 % — ABNORMAL LOW (ref 36.0–46.0)
HEMOGLOBIN: 10.4 g/dL — AB (ref 12.0–15.0)
LYMPHS ABS: 2.4 10*3/uL (ref 0.7–4.0)
LYMPHS PCT: 30 %
MCH: 28.2 pg (ref 26.0–34.0)
MCHC: 32.3 g/dL (ref 30.0–36.0)
MCV: 87.3 fL (ref 78.0–100.0)
MONOS PCT: 7 %
Monocytes Absolute: 0.5 10*3/uL (ref 0.1–1.0)
Neutro Abs: 5 10*3/uL (ref 1.7–7.7)
Neutrophils Relative %: 61 %
PLATELETS: 251 10*3/uL (ref 150–400)
RBC: 3.69 MIL/uL — AB (ref 3.87–5.11)
RDW: 15.1 % (ref 11.5–15.5)
WBC: 8.1 10*3/uL (ref 4.0–10.5)

## 2017-03-30 LAB — PREGNANCY, URINE: Preg Test, Ur: NEGATIVE

## 2017-03-30 LAB — URINALYSIS, ROUTINE W REFLEX MICROSCOPIC
Bilirubin Urine: NEGATIVE
GLUCOSE, UA: NEGATIVE mg/dL
Hgb urine dipstick: NEGATIVE
Ketones, ur: 5 mg/dL — AB
LEUKOCYTES UA: NEGATIVE
Nitrite: NEGATIVE
PH: 6 (ref 5.0–8.0)
Protein, ur: NEGATIVE mg/dL
Specific Gravity, Urine: 1.026 (ref 1.005–1.030)

## 2017-03-30 LAB — COMPREHENSIVE METABOLIC PANEL
ALT: 16 U/L (ref 14–54)
AST: 21 U/L (ref 15–41)
Albumin: 3.6 g/dL (ref 3.5–5.0)
Alkaline Phosphatase: 58 U/L (ref 38–126)
Anion gap: 6 (ref 5–15)
BUN: 11 mg/dL (ref 6–20)
CHLORIDE: 105 mmol/L (ref 101–111)
CO2: 27 mmol/L (ref 22–32)
CREATININE: 0.86 mg/dL (ref 0.44–1.00)
Calcium: 8.9 mg/dL (ref 8.9–10.3)
GFR calc Af Amer: >60 mL/min (ref >60)
GFR calc non Af Amer: >60 mL/min (ref >60)
Glucose, Bld: 98 mg/dL (ref 65–99)
Potassium: 3.6 mmol/L (ref 3.5–5.1)
SODIUM: 138 mmol/L (ref 135–145)
Total Bilirubin: 0.6 mg/dL (ref 0.3–1.2)
Total Protein: 5.9 g/dL — ABNORMAL LOW (ref 6.5–8.1)

## 2017-03-30 LAB — LIPASE, BLOOD: Lipase: 53 U/L — ABNORMAL HIGH (ref 11–51)

## 2017-03-30 MED ORDER — ONDANSETRON 4 MG PO TBDP
4.0000 mg | ORAL_TABLET | Freq: Once | ORAL | Status: AC
  Administered 2017-03-30: 4 mg via ORAL
  Filled 2017-03-30: qty 1

## 2017-03-30 MED ORDER — ACETAMINOPHEN 500 MG PO TABS
1000.0000 mg | ORAL_TABLET | Freq: Once | ORAL | Status: AC
  Administered 2017-03-30: 1000 mg via ORAL
  Filled 2017-03-30: qty 2

## 2017-03-30 MED ORDER — SODIUM CHLORIDE 0.9 % IV BOLUS (SEPSIS)
1000.0000 mL | Freq: Once | INTRAVENOUS | Status: AC
Start: 2017-03-30 — End: 2017-03-30
  Administered 2017-03-30: 1000 mL via INTRAVENOUS

## 2017-03-30 MED ORDER — IBUPROFEN 800 MG PO TABS
800.0000 mg | ORAL_TABLET | Freq: Once | ORAL | Status: AC
  Administered 2017-03-30: 800 mg via ORAL
  Filled 2017-03-30: qty 1

## 2017-03-30 MED ORDER — MORPHINE SULFATE (PF) 4 MG/ML IV SOLN
4.0000 mg | Freq: Once | INTRAVENOUS | Status: AC
  Administered 2017-03-30: 4 mg via INTRAVENOUS
  Filled 2017-03-30 (×2): qty 1

## 2017-03-30 MED ORDER — ONDANSETRON HCL 4 MG/2ML IJ SOLN
4.0000 mg | Freq: Once | INTRAMUSCULAR | Status: AC
  Administered 2017-03-30: 4 mg via INTRAVENOUS
  Filled 2017-03-30 (×2): qty 2

## 2017-03-30 MED ORDER — GI COCKTAIL ~~LOC~~
30.0000 mL | Freq: Once | ORAL | Status: AC
  Administered 2017-03-30: 30 mL via ORAL
  Filled 2017-03-30 (×2): qty 30

## 2017-03-30 NOTE — ED Notes (Addendum)
Pt c/o 10/10 left sided headache, dizziness, and short term memory problems. The dizziness is made worse when standing up and walking.

## 2017-03-30 NOTE — ED Provider Notes (Signed)
[facility] DEPT Provider Note   CSN: [id number] Arrival date & time: 03/30/17  2049  By signing my name below, I, Margit Banda, attest that this documentation has been prepared under the direction and in the presence of Benyamin Jeff PA-C.  Electronically Signed: Margit Banda, ED Scribe. 03/30/17. 10:40 PM.  History   Chief Complaint Chief Complaint  Patient presents with  . Fall  . Headache  . Dizziness    HPI Maria Sampson is a 26 y.o. female who presents to the Emergency Department complaining of a consistent HA s/p falling a few days ago. Pt reports she has become more forgetful, and has a hard time remembering what she has done. Associated sx include left sided head pain, abdominal pain, nausea, dizziness, seeing spots, slow moving, photophobia, and left sided numbness and tingling. She was seen at [facility] today, 03/30/17 for abdominal pain but does not feel her complaint of headache and fall were addressed.  Pt fell again PTA and hit her head on the wall. No hx of migraines. Pt denies LOC and vomiting.   The history is provided by the patient. No language interpreter was used.    Past Medical History:  Diagnosis Date  . Depression   . Otitis externa due to Pseudomonas aeruginosa 04/2014    Patient Active Problem List   Diagnosis Date Noted  . Constipation 02/26/2017  . MDD (major depressive disorder), recurrent severe, without psychosis ([place]) 02/26/2017  . Suicide attempt ([place]) 02/20/2017  . Suicide attempt by acetaminophen overdose ([place]) 02/20/2017  . Mild tetrahydrocannabinol (THC) abuse 02/20/2017  . BPV (benign positional vertigo) 10/27/2015  . Malnutrition of moderate degree 10/26/2015  . Abdominal pain 10/23/2015  . Nausea & vomiting 10/23/2015  . Sepsis ([place]) 10/23/2015  . Fibroid, uterine 10/23/2015  . Ileus ([place]) 10/23/2015  . Normocytic anemia 10/23/2015  . Overdose 06/03/2014  . Depressive disorder 06/03/2014  . Suicidal ideation 06/03/2014  .  Otitis externa due to Pseudomonas aeruginosa 05/03/2014    Past Surgical History:  Procedure Laterality Date  . DENTAL EXAMINATION UNDER ANESTHESIA      OB History    No data available       Home Medications    Prior to Admission medications   Medication Sig Start Date End Date Taking? Authorizing Provider  traZODone (DESYREL) 50 MG tablet Take 1 tablet (50 mg total) by mouth at bedtime as needed for sleep. Patient not taking: Reported on 03/30/2017 03/01/17   Lindell Spar I, NP  zolpidem (AMBIEN) 5 MG tablet Take 5 mg by mouth at bedtime as needed for sleep.    [provider]    Family History Family History  Problem Relation Age of Onset  . Crohn's disease Other     Social History Social History  Substance Use Topics  . Smoking status: Current Every Day Smoker    Types: Cigars  . Smokeless tobacco: Never Used     Comment: 2 per day  . Alcohol use Yes     Comment: Social     Allergies   Peanut-containing drug products and Food   Review of Systems Review of Systems  Constitutional: Negative for chills and fever.  HENT: Negative.   Eyes: Positive for photophobia.  Respiratory: Negative for cough and shortness of breath.   Cardiovascular: Negative for chest pain.  Gastrointestinal: Positive for abdominal pain and nausea. Negative for vomiting.  Musculoskeletal: Negative for myalgias and neck pain.  Skin: Negative for wound.  Neurological: Positive for dizziness, numbness  and headaches. Negative for syncope.     Physical Exam Updated Vital Signs BP 138/80 (BP Location: Left Arm)   Pulse 88   Temp 98.3 F (36.8 C) (Oral)   Resp 18   Ht 5\' 11"  (1.803 m)   Wt 160 lb (72.6 kg)   LMP  (LMP Unknown)   SpO2 100%   BMI 22.32 kg/m   Physical Exam  Constitutional: She is oriented to person, place, and time. She appears well-developed and well-nourished. No distress.  HENT:  Head: Normocephalic and atraumatic.  Right Ear: Tympanic membrane  normal.  Left Ear: Tympanic membrane normal.  Mouth/Throat: Oropharynx is clear and moist.  Tender in left occipital scalp without hematoma wound or swelling.   Eyes: Conjunctivae and EOM are normal. Pupils are equal, round, and reactive to light.  Neck: Normal range of motion.  Cardiovascular: Normal rate, regular rhythm and normal heart sounds.   No murmur heard. Pulmonary/Chest: Effort normal and breath sounds normal. She has no wheezes. She has no rales.  Abdominal: Soft. She exhibits no distension. There is no tenderness.  Musculoskeletal: Normal range of motion.  Neurological: She is alert and oriented to person, place, and time.  CN's 3-12 grossly intact. Speech is clear and focused. No facial asymmetry. No lateralizing weakness. Reflexes are equal. No deficits of coordination. Ambulatory without imbalance.     Skin: No pallor.  Psychiatric: She has a normal mood and affect. Her behavior is normal.  Nursing note and vitals reviewed.    ED Treatments / Results  DIAGNOSTIC STUDIES: Oxygen Saturation is 100% on RA, normal by my interpretation.   COORDINATION OF CARE: 10:40 PM-Discussed next steps with pt. Pt verbalized understanding and is agreeable with the plan.    Labs (all labs ordered are listed, but only abnormal results are displayed) Labs Reviewed - No data to display  EKG  EKG Interpretation None       Radiology US Abdomen Limited Ruq  Result Date: 03/30/2017 CLINICAL DATA:  26 y/o  F; right upper quadrant abdominal pain. EXAM: US ABDOMEN LIMITED - RIGHT UPPER QUADRANT COMPARISON:  02/22/2017 abdominal ultrasound. FINDINGS: Gallbladder: No gallstones or wall thickening visualized. No sonographic Murphy sign noted by sonographer. Common bile duct: Diameter: 2 mm Liver: No focal lesion identified. Within normal limits in parenchymal echogenicity. IMPRESSION: Normal right upper quadrant ultrasound. Electronically Signed   By: Kristine Garbe M.D.   On:  03/30/2017 07:00    Procedures Procedures (including critical care time)  Medications Ordered in ED Medications - No data to display   Initial Impression / Assessment and Plan / ED Course  I have reviewed the triage vital signs and the nursing notes.  Pertinent labs & imaging results that were available during my care of the patient were reviewed by me and considered in my medical decision making (see chart for details).     Patient re-presents to ED for evaluation of headache, falls, memory loss, nausea. Seen this am for abdominal pain but not for current symptoms.   Neurologically intact. VSS. Orthostatics were borderline, however, she was given IVF's this morning - doubt dehydration.   Evaluation in ED negative. No neurologic deficits. Normal head CT. She can be discharged home with symptomatic treatment for nausea. Recommend tylenol for pain and neurology follow up if symptoms persist.  Final Clinical Impressions(s) / ED Diagnoses   Final diagnoses:  None   Headache Nausea  New Prescriptions New Prescriptions   No medications on file  I personally performed the services described in this documentation, which was scribed in my presence. The recorded information has been reviewed and is accurate.     Charlann Lange, PA-C 03/31/17 0109    Tegeler, Gwenyth [name], MD 03/31/17 [phone]

## 2017-03-30 NOTE — Discharge Instructions (Signed)
Follow up with your PCP, return for worsening pain, fever.  °

## 2017-03-30 NOTE — ED Provider Notes (Signed)
[facility] DEPT Provider Note   CSN: [id number] Arrival date & time: 03/30/17  0340  By signing my name below, I, Jeanell Sparrow, attest that this documentation has been prepared under the direction and in the presence of Deno Etienne, DO. Electronically Signed: Jeanell Sparrow, Scribe. 03/30/2017. 3:50 AM.  History   Chief Complaint Chief Complaint  Patient presents with  . Abdominal Pain  . Otalgia   The history is provided by the patient. No language interpreter was used.    HPI Comments: Maria Sampson is a 26 y.o. female who presents to the Emergency Department complaining of constant moderate RLQ abdominal pain that started a few days ago. She reports she has been taking Ambien lately and getting symptoms as a result. No modifying factors. She had an episode where stood, got dizziness, fell, and hit her head. She reports associated left ear pain, nausea, itchiness, headache, dizziness, and sleep disturbance. She describes her headache as a sharp, stabbing sensation that radiates to her left ear. She has not eaten in the past 2 days because she "forgets to eat". Denies any vomiting, diarrhea, fever, or other complaints at this time.  Past Medical History:  Diagnosis Date  . Depression   . Otitis externa due to Pseudomonas aeruginosa 04/2014    Patient Active Problem List   Diagnosis Date Noted  . Constipation 02/26/2017  . MDD (major depressive disorder), recurrent severe, without psychosis ([place]) 02/26/2017  . Suicide attempt ([place]) 02/20/2017  . Suicide attempt by acetaminophen overdose ([place]) 02/20/2017  . Mild tetrahydrocannabinol (THC) abuse 02/20/2017  . BPV (benign positional vertigo) 10/27/2015  . Malnutrition of moderate degree 10/26/2015  . Abdominal pain 10/23/2015  . Nausea & vomiting 10/23/2015  . Sepsis ([place]) 10/23/2015  . Fibroid, uterine 10/23/2015  . Ileus ([place]) 10/23/2015  . Normocytic anemia 10/23/2015  . Overdose 06/03/2014  . Depressive disorder  06/03/2014  . Suicidal ideation 06/03/2014  . Otitis externa due to Pseudomonas aeruginosa 05/03/2014    Past Surgical History:  Procedure Laterality Date  . DENTAL EXAMINATION UNDER ANESTHESIA      OB History    No data available       Home Medications    Prior to Admission medications   Medication Sig Start Date End Date Taking? Authorizing Provider  traZODone (DESYREL) 50 MG tablet Take 1 tablet (50 mg total) by mouth at bedtime as needed for sleep. 03/01/17   Encarnacion Slates, NP    Family History Family History  Problem Relation Age of Onset  . Crohn's disease Other     Social History Social History  Substance Use Topics  . Smoking status: Current Every Day Smoker    Types: Cigarettes  . Smokeless tobacco: Never Used  . Alcohol use Yes     Comment: social     Allergies   Peanut-containing drug products and Food   Review of Systems Review of Systems  Constitutional: Negative for chills and fever.  HENT: Positive for ear pain (Left). Negative for congestion and rhinorrhea.   Eyes: Negative for redness and visual disturbance.  Respiratory: Negative for shortness of breath and wheezing.   Cardiovascular: Negative for chest pain and palpitations.  Gastrointestinal: Positive for abdominal pain and nausea. Negative for diarrhea and vomiting.  Genitourinary: Negative for dysuria and urgency.  Musculoskeletal: Negative for arthralgias and myalgias.  Skin: Negative for pallor and wound.  Neurological: Positive for dizziness and headaches.  Psychiatric/Behavioral: Positive for sleep disturbance.     Physical Exam Updated Vital  Signs BP (!) 137/92 (BP Location: Right Arm)   Pulse 72   Temp 98.3 F (36.8 C) (Oral)   Resp 20   SpO2 99%   Physical Exam  Constitutional: She is oriented to person, place, and time. She appears well-developed and well-nourished. No distress.  HENT:  Head: Normocephalic and atraumatic.  Eyes: EOM are normal. Pupils are equal,  round, and reactive to light.  Neck: Normal range of motion. Neck supple.  Cardiovascular: Normal rate and regular rhythm.  Exam reveals no gallop and no friction rub.   No murmur heard. Pulmonary/Chest: Effort normal. She has no wheezes. She has no rales.  Abdominal: Soft. She exhibits no distension. There is tenderness. There is negative Murphy's sign.  Tenderness worse in the RUQ. Negative Murphy's sign.   Musculoskeletal: She exhibits no edema or tenderness.  Neurological: She is alert and oriented to person, place, and time.  Skin: Skin is warm and dry. She is not diaphoretic.  Psychiatric: She has a normal mood and affect. Her behavior is normal.  Nursing note and vitals reviewed.    ED Treatments / Results  DIAGNOSTIC STUDIES: Oxygen Saturation is 99% on RA, normal by my interpretation.    COORDINATION OF CARE: 3:54 AM- Pt advised of plan for treatment and pt agrees.  Labs (all labs ordered are listed, but only abnormal results are displayed) Labs Reviewed  CBC WITH DIFFERENTIAL/PLATELET - Abnormal; Notable for the following:       Result Value   RBC 3.69 (*)    Hemoglobin 10.4 (*)    HCT 32.2 (*)    All other components within normal limits  COMPREHENSIVE METABOLIC PANEL - Abnormal; Notable for the following:    Total Protein 5.9 (*)    All other components within normal limits  LIPASE, BLOOD - Abnormal; Notable for the following:    Lipase 53 (*)    All other components within normal limits    EKG  EKG Interpretation None       Radiology US Abdomen Limited Ruq  Result Date: 03/30/2017 CLINICAL DATA:  26 y/o  F; right upper quadrant abdominal pain. EXAM: US ABDOMEN LIMITED - RIGHT UPPER QUADRANT COMPARISON:  02/22/2017 abdominal ultrasound. FINDINGS: Gallbladder: No gallstones or wall thickening visualized. No sonographic Murphy sign noted by sonographer. Common bile duct: Diameter: 2 mm Liver: No focal lesion identified. Within normal limits in parenchymal  echogenicity. IMPRESSION: Normal right upper quadrant ultrasound. Electronically Signed   By: Kristine Garbe M.D.   On: 03/30/2017 07:00    Procedures Procedures (including critical care time)  Medications Ordered in ED Medications  gi cocktail (Maalox,Lidocaine,Donnatal) (not administered)  morphine 4 MG/ML injection 4 mg (not administered)  ondansetron (ZOFRAN) injection 4 mg (not administered)  sodium chloride 0.9 % bolus 1,000 mL (not administered)  acetaminophen (TYLENOL) tablet 1,000 mg (1,000 mg Oral Given 03/30/17 0612)  ibuprofen (ADVIL,MOTRIN) tablet 800 mg (800 mg Oral Given 03/30/17 0612)  ondansetron (ZOFRAN-ODT) disintegrating tablet 4 mg (4 mg Oral Given 03/30/17 0612)     Initial Impression / Assessment and Plan / ED Course  I have reviewed the triage vital signs and the nursing notes.  Pertinent labs & imaging results that were available during my care of the patient were reviewed by me and considered in my medical decision making (see chart for details).     26 yo F With a chief complaint of right upper quadrant abdominal pain. Going on for the past couple days. On exam pain is  worse in the right upper quadrant. Patient has no fevers and is not vomiting discussed with her the patient says imaging. Patient continues to have pain well on the knee. Laboratory evaluation with mild elevation of lipase but no other significant finding. As she is having pain will obtain ultrasound.  RUQ [place] negative.  D/c home.   7:17 AM:  I have discussed the diagnosis/risks/treatment options with the patient and family and believe the pt to be eligible for discharge home to follow-up with PCP. We also discussed returning to the ED immediately if new or worsening sx occur. We discussed the sx which are most concerning (e.g., sudden worsening pain, fever, inability to tolerate by mouth) that necessitate immediate return. Medications administered to the patient during their visit and any  new prescriptions provided to the patient are listed below.  Medications given during this visit Medications  gi cocktail (Maalox,Lidocaine,Donnatal) (not administered)  morphine 4 MG/ML injection 4 mg (not administered)  ondansetron (ZOFRAN) injection 4 mg (not administered)  sodium chloride 0.9 % bolus 1,000 mL (not administered)  acetaminophen (TYLENOL) tablet 1,000 mg (1,000 mg Oral Given 03/30/17 0612)  ibuprofen (ADVIL,MOTRIN) tablet 800 mg (800 mg Oral Given 03/30/17 0612)  ondansetron (ZOFRAN-ODT) disintegrating tablet 4 mg (4 mg Oral Given 03/30/17 0612)     The patient appears reasonably screen and/or stabilized for discharge and I doubt any other medical condition or other [facility] requiring further screening, evaluation, or treatment in the ED at this time prior to discharge.    Final Clinical Impressions(s) / ED Diagnoses   Final diagnoses:  Right upper quadrant abdominal pain  RUQ abdominal pain    New Prescriptions New Prescriptions   No medications on file   I personally performed the services described in this documentation, which was scribed in my presence. The recorded information has been reviewed and is accurate.      Deno Etienne, DO 03/30/17 [phone]

## 2017-03-30 NOTE — ED Triage Notes (Signed)
Few days ago patient states she stood up and her legs got weak and she fell and hit the back of her head on the bed frame. Pt was seen at [facility]. Pt states her head has been hurting. A few hours ago when she stood up she fell again and hit the back of her head on a wall. Pain has been getting increasingly worse. Pt states she is "having memory issues and her left side of her head is about to explode." Endorses nausea and vision changes when she stands up. Pt states no LOC, but endorses change in mentation.

## 2017-03-30 NOTE — ED Triage Notes (Signed)
Pt states she started taking ambien to sleep and has been having some weird side effects. Pt states the Lorrin Mais has caused her to have abd pains, ear pains, and feel dizzy. Pt states she also fell from a standing position and struck her head.

## 2017-03-31 MED ORDER — ONDANSETRON 4 MG PO TBDP
4.0000 mg | ORAL_TABLET | Freq: Once | ORAL | Status: AC
  Administered 2017-03-31: 4 mg via ORAL
  Filled 2017-03-31: qty 1

## 2017-03-31 MED ORDER — ONDANSETRON HCL 4 MG PO TABS: 4.0000 mg | ORAL_TABLET | Freq: Four times a day (QID) | ORAL | 0 refills | Status: DC

## 2017-06-19 ENCOUNTER — Emergency Department (HOSPITAL_COMMUNITY)
Admission: EM | Admit: 2017-06-19 | Discharge: 2017-06-19 | Disposition: A | Discharge status: Home / Self Care | Payer: Self-pay | Attending: Emergency Medicine | Admitting: Emergency Medicine

## 2017-06-19 ENCOUNTER — Encounter (HOSPITAL_COMMUNITY): Payer: Self-pay | Admitting: Emergency Medicine

## 2017-06-19 ENCOUNTER — Emergency Department (HOSPITAL_COMMUNITY): Payer: Self-pay

## 2017-06-19 DIAGNOSIS — Z79899 Other long term (current) drug therapy: Secondary | ICD-10-CM | POA: Insufficient documentation

## 2017-06-19 DIAGNOSIS — S60221A Contusion of right hand, initial encounter: Secondary | ICD-10-CM | POA: Insufficient documentation

## 2017-06-19 DIAGNOSIS — Z91010 Allergy to peanuts: Secondary | ICD-10-CM | POA: Insufficient documentation

## 2017-06-19 DIAGNOSIS — F17290 Nicotine dependence, other tobacco product, uncomplicated: Secondary | ICD-10-CM | POA: Insufficient documentation

## 2017-06-19 DIAGNOSIS — Y929 Unspecified place or not applicable: Secondary | ICD-10-CM | POA: Insufficient documentation

## 2017-06-19 DIAGNOSIS — W208XXA Other cause of strike by thrown, projected or falling object, initial encounter: Secondary | ICD-10-CM | POA: Insufficient documentation

## 2017-06-19 DIAGNOSIS — Y939 Activity, unspecified: Secondary | ICD-10-CM | POA: Insufficient documentation

## 2017-06-19 DIAGNOSIS — Y999 Unspecified external cause status: Secondary | ICD-10-CM | POA: Insufficient documentation

## 2017-06-19 MED ORDER — NAPROXEN 500 MG PO TABS: 500.0000 mg | ORAL_TABLET | Freq: Two times a day (BID) | ORAL | 0 refills | Status: DC | PRN

## 2017-06-19 NOTE — ED Provider Notes (Signed)
[facility] DEPT Provider Note   CSN: [id number] Arrival date & time: 06/19/17  1512     History   Chief Complaint No chief complaint on file.   HPI Maria Sampson is a 26 y.o. female.  The history is provided by the patient and medical records. No language interpreter was used.   Maria Sampson is a 26 y.o. female  with a PMH as listed below who presents to the Emergency Department complaining of persistent, constant right hand and forearm pain x 2 days. Pain 8/10 and began acutely after a 75 lb bucket fell onto the area. Pain most severe over the 3rd-5th digits but will radiate up the wrist midway up to the forearm. Worse with movement of the fingers and palpationOTC pain relief with no relief. No numbness or tingling. No hx of prior injuries to RUE.   Past Medical History:  Diagnosis Date  . Depression   . Otitis externa due to Pseudomonas aeruginosa 04/2014    Patient Active Problem List   Diagnosis Date Noted  . Constipation 02/26/2017  . MDD (major depressive disorder), recurrent severe, without psychosis ([place]) 02/26/2017  . Suicide attempt ([place]) 02/20/2017  . Suicide attempt by acetaminophen overdose ([place]) 02/20/2017  . Mild tetrahydrocannabinol (THC) abuse 02/20/2017  . BPV (benign positional vertigo) 10/27/2015  . Malnutrition of moderate degree 10/26/2015  . Abdominal pain 10/23/2015  . Nausea & vomiting 10/23/2015  . Sepsis ([place]) 10/23/2015  . Fibroid, uterine 10/23/2015  . Ileus ([place]) 10/23/2015  . Normocytic anemia 10/23/2015  . Overdose 06/03/2014  . Depressive disorder 06/03/2014  . Suicidal ideation 06/03/2014  . Otitis externa due to Pseudomonas aeruginosa 05/03/2014    Past Surgical History:  Procedure Laterality Date  . DENTAL EXAMINATION UNDER ANESTHESIA      OB History    No data available       Home Medications    Prior to Admission medications   Medication Sig Start Date End Date Taking? Authorizing Provider  naproxen  (NAPROSYN) 500 MG tablet Take 1 tablet (500 mg total) by mouth 2 (two) times daily as needed. 06/19/17   Ward, Ozella Almond, PA-C  ondansetron (ZOFRAN) 4 MG tablet Take 1 tablet (4 mg total) by mouth every 6 (six) hours. 03/31/17   Charlann Lange, PA-C  traZODone (DESYREL) 50 MG tablet Take 1 tablet (50 mg total) by mouth at bedtime as needed for sleep. Patient not taking: Reported on 03/30/2017 03/01/17   Lindell Spar I, NP  zolpidem (AMBIEN) 5 MG tablet Take 5 mg by mouth at bedtime as needed for sleep.    [provider]    Family History Family History  Problem Relation Age of Onset  . Crohn's disease Other     Social History Social History  Substance Use Topics  . Smoking status: Current Every Day Smoker    Types: Cigars  . Smokeless tobacco: Never Used     Comment: 2 per day  . Alcohol use Yes     Comment: Social     Allergies   Peanut-containing drug products and Food   Review of Systems Review of Systems  Musculoskeletal: Positive for arthralgias.  Neurological: Negative for weakness and numbness.     Physical Exam Updated Vital Signs BP (!) 137/93 (BP Location: Right Arm)   Pulse 74   Temp 98.7 F (37.1 C) (Oral)   Resp 16   LMP 06/14/2017   SpO2 100%   Physical Exam  Constitutional: She appears well-developed and well-nourished.  No distress.  HENT:  Head: Normocephalic and atraumatic.  Neck: Neck supple.  Cardiovascular: Normal rate, regular rhythm and normal heart sounds.   No murmur heard. Pulmonary/Chest: Effort normal and breath sounds normal. No respiratory distress. She has no wheezes. She has no rales.  Musculoskeletal:  Tenderness to palpation of right 3rd, 4th and 5th metacarpals and diffuse tenderness to distal forearm. No open wounds. Decreased ROM 2/2 pain.  Neurological: She is alert.  Bilateral upper extremities neurovascularly intact.  Skin: Skin is warm and dry.  Nursing note and vitals reviewed.    ED Treatments / Results    Labs (all labs ordered are listed, but only abnormal results are displayed) Labs Reviewed - No data to display  EKG  EKG Interpretation None       Radiology Dg Wrist Complete Right  Result Date: 06/19/2017 CLINICAL DATA:  Right hand/wrist pain after dropping a 75 pound plastic bucket onto the hand 2 days ago. Point tenderness at the wrist and third through fifth metacarpals. Initial encounter. EXAM: RIGHT WRIST - COMPLETE 3+ VIEW COMPARISON:  Right hand radiographs 12/21/2014 and right wrist radiographs 08/08/2012 FINDINGS: There is no evidence of fracture or dislocation. There is no evidence of arthropathy or other focal bone abnormality. Soft tissues are unremarkable. IMPRESSION: Negative. Electronically Signed   By: Logan Bores M.D.   On: 06/19/2017 15:55   Dg Hand Complete Right  Result Date: 06/19/2017 CLINICAL DATA:  Right hand/wrist pain after dropping a 75 pound plastic bucket onto the hand 2 days ago. Point tenderness at the wrist and third through fifth metacarpals. Initial encounter. EXAM: RIGHT HAND - COMPLETE 3+ VIEW COMPARISON:  12/21/2014 FINDINGS: There is no evidence of fracture or dislocation. There is no evidence of arthropathy or other focal bone abnormality. Soft tissues are unremarkable. IMPRESSION: Negative. Electronically Signed   By: Logan Bores M.D.   On: 06/19/2017 15:55    Procedures Procedures (including critical care time)  Medications Ordered in ED Medications - No data to display   Initial Impression / Assessment and Plan / ED Course  I have reviewed the triage vital signs and the nursing notes.  Pertinent labs & imaging results that were available during my care of the patient were reviewed by me and considered in my medical decision making (see chart for details).    Maria STRADFORD is a 26 y.o. female who presents to ED for right hand and forearm pain after a heavy bucket fell on the area two days ago. NVI. No open wounds. X-ray negative. Area  wrapped with ACE for comfort. Symptomatic home care instructions discussed. PCP follow up if symptoms persist. Reasons to return to ER discussed and all questions answered.    Final Clinical Impressions(s) / ED Diagnoses   Final diagnoses:  Contusion of right hand, initial encounter    New Prescriptions Discharge Medication List as of 06/19/2017  4:22 PM    START taking these medications   Details  naproxen (NAPROSYN) 500 MG tablet Take 1 tablet (500 mg total) by mouth 2 (two) times daily as needed., Starting Wed 06/19/2017, Print         Ward, Ozella Almond, PA-C 06/19/17 1703    Veryl Speak, MD 06/20/17 [phone]

## 2017-06-19 NOTE — Discharge Instructions (Signed)
It was my pleasure taking care of you today!   Naproxen twice daily as needed for pain. Ice affected area as needed for pain/swelling (instructions below).   COLD THERAPY DIRECTIONS:  Ice or gel packs can be used to reduce both pain and swelling. Ice is the most helpful within the first 24 to 48 hours after an injury or flareup from overusing a muscle or joint.  Ice is effective, has very few side effects, and is safe for most people to use.   If you expose your skin to cold temperatures for too long or without the proper protection, you can damage your skin or nerves. Watch for signs of skin damage due to cold.   HOME CARE INSTRUCTIONS  Follow these tips to use ice and cold packs safely.  Place a dry or damp towel between the ice and skin. A damp towel will cool the skin more quickly, so you may need to shorten the time that the ice is used.  For a more rapid response, add gentle compression to the ice.  Ice for no more than 10 to 20 minutes at a time. The bonier the area you are icing, the less time it will take to get the benefits of ice.  Check your skin after 5 minutes to make sure there are no signs of a poor response to cold or skin damage.  Rest 20 minutes or more in between uses.  Once your skin is numb, you can end your treatment. You can test numbness by very lightly touching your skin. The touch should be so light that you do not see the skin dimple from the pressure of your fingertip. When using ice, most people will feel these normal sensations in this order: cold, burning, aching, and numbness.   Please follow up with your primary doctor or the orthopedist listed if symptoms do not improve in 1 week.   Please return to the ER for new or worsening symptoms, any additional concerns.

## 2017-06-19 NOTE — ED Triage Notes (Signed)
Pt c/o right hand pain onset 2 days ago after dropping 75 pound plastic bucket onto hand and trapping hand between bucket and rollers, staff had to pick bucket up to free patient's hand.  Point tenderness to right wrist, 3rd, 4th, and 5th metacarpals. Sensation and motor function intact, but pain with ROM of fingers.

## 2017-10-04 ENCOUNTER — Emergency Department (HOSPITAL_COMMUNITY)
Admission: EM | Admit: 2017-10-04 | Discharge: 2017-10-04 | Disposition: A | Discharge status: Home / Self Care | Payer: Self-pay | Attending: Emergency Medicine | Admitting: Emergency Medicine

## 2017-10-04 ENCOUNTER — Encounter (HOSPITAL_COMMUNITY): Payer: Self-pay

## 2017-10-04 ENCOUNTER — Other Ambulatory Visit: Payer: Self-pay

## 2017-10-04 DIAGNOSIS — W268XXA Contact with other sharp object(s), not elsewhere classified, initial encounter: Secondary | ICD-10-CM | POA: Insufficient documentation

## 2017-10-04 DIAGNOSIS — Y9289 Other specified places as the place of occurrence of the external cause: Secondary | ICD-10-CM | POA: Insufficient documentation

## 2017-10-04 DIAGNOSIS — Y990 Civilian activity done for income or pay: Secondary | ICD-10-CM | POA: Insufficient documentation

## 2017-10-04 DIAGNOSIS — Z23 Encounter for immunization: Secondary | ICD-10-CM | POA: Insufficient documentation

## 2017-10-04 DIAGNOSIS — Y939 Activity, unspecified: Secondary | ICD-10-CM | POA: Insufficient documentation

## 2017-10-04 DIAGNOSIS — S51011A Laceration without foreign body of right elbow, initial encounter: Secondary | ICD-10-CM | POA: Insufficient documentation

## 2017-10-04 DIAGNOSIS — Z91010 Allergy to peanuts: Secondary | ICD-10-CM | POA: Insufficient documentation

## 2017-10-04 DIAGNOSIS — F17290 Nicotine dependence, other tobacco product, uncomplicated: Secondary | ICD-10-CM | POA: Insufficient documentation

## 2017-10-04 DIAGNOSIS — Z79899 Other long term (current) drug therapy: Secondary | ICD-10-CM | POA: Insufficient documentation

## 2017-10-04 MED ORDER — TETANUS-DIPHTH-ACELL PERTUSSIS 5-2.5-18.5 LF-MCG/0.5 IM SUSP
0.5000 mL | Freq: Once | INTRAMUSCULAR | Status: AC
  Administered 2017-10-04: 0.5 mL via INTRAMUSCULAR
  Filled 2017-10-04: qty 0.5

## 2017-10-04 MED ORDER — BACITRACIN ZINC 500 UNIT/GM EX OINT
TOPICAL_OINTMENT | Freq: Once | CUTANEOUS | Status: AC
  Administered 2017-10-04: 1 via TOPICAL
  Filled 2017-10-04: qty 0.9

## 2017-10-04 MED ORDER — LIDOCAINE-EPINEPHRINE-TETRACAINE (LET) SOLUTION
3.0000 mL | Freq: Once | NASAL | Status: AC
  Administered 2017-10-04: 3 mL via TOPICAL
  Filled 2017-10-04: qty 3

## 2017-10-04 NOTE — ED Notes (Signed)
PT DISCHARGED. INSTRUCTIONS GIVEN. AAOX4. PT IN NO APPARENT DISTRESS OR PAIN. THE OPPORTUNITY TO ASK QUESTIONS WAS PROVIDED. 

## 2017-10-04 NOTE — ED Provider Notes (Signed)
[facility] DEPT Provider Note   CSN: [id number] Arrival date & time: 10/04/17  1631     History   Chief Complaint Chief Complaint  Patient presents with  . Laceration    HPI Maria Sampson is a 26 y.o. female who presents to the ED with a laceration to the elbow. The injury occurred while she was at work today. Patient is unsure of how the injury occurred. She reports that she works around a lot of metal. Patient is not up to date on tetanus. There is some bleeding from the wound.    HPI  Past Medical History:  Diagnosis Date  . Depression   . Otitis externa due to Pseudomonas aeruginosa 04/2014    Patient Active Problem List   Diagnosis Date Noted  . Constipation 02/26/2017  . MDD (major depressive disorder), recurrent severe, without psychosis ([place]) 02/26/2017  . Suicide attempt ([place]) 02/20/2017  . Suicide attempt by acetaminophen overdose ([place]) 02/20/2017  . Mild tetrahydrocannabinol (THC) abuse 02/20/2017  . BPV (benign positional vertigo) 10/27/2015  . Malnutrition of moderate degree 10/26/2015  . Abdominal pain 10/23/2015  . Nausea & vomiting 10/23/2015  . Sepsis ([place]) 10/23/2015  . Fibroid, uterine 10/23/2015  . Ileus ([place]) 10/23/2015  . Normocytic anemia 10/23/2015  . Overdose 06/03/2014  . Depressive disorder 06/03/2014  . Suicidal ideation 06/03/2014  . Otitis externa due to Pseudomonas aeruginosa 05/03/2014    Past Surgical History:  Procedure Laterality Date  . DENTAL EXAMINATION UNDER ANESTHESIA      OB History    No data available       Home Medications    Prior to Admission medications   Medication Sig Start Date End Date Taking? Authorizing Provider  naproxen (NAPROSYN) 500 MG tablet Take 1 tablet (500 mg total) by mouth 2 (two) times daily as needed. 06/19/17   Ward, Ozella Almond, PA-C  ondansetron (ZOFRAN) 4 MG tablet Take 1 tablet (4 mg total) by mouth every 6 (six) hours. 03/31/17   Charlann Lange, PA-C   traZODone (DESYREL) 50 MG tablet Take 1 tablet (50 mg total) by mouth at bedtime as needed for sleep. Patient not taking: Reported on 03/30/2017 03/01/17   Lindell Spar I, NP  zolpidem (AMBIEN) 5 MG tablet Take 5 mg by mouth at bedtime as needed for sleep.    [provider]    Family History Family History  Problem Relation Age of Onset  . Crohn's disease Other     Social History Social History   Tobacco Use  . Smoking status: Current Every Day Smoker    Types: Cigars  . Smokeless tobacco: Never Used  . Tobacco comment: 2 per day  Substance Use Topics  . Alcohol use: Yes    Comment: Social  . Drug use: Yes    Types: Marijuana     Allergies   Peanut-containing drug products and Food   Review of Systems Review of Systems  Skin: Positive for wound.  All other systems reviewed and are negative.    Physical Exam Updated Vital Signs BP (!) 140/95 (BP Location: Left Arm)   Pulse 85   Temp 98.3 F (36.8 C) (Oral)   Resp 18   Ht 5\' 10"  (1.778 m)   Wt 83.9 kg (185 lb)   LMP 09/27/2017   SpO2 100%   BMI 26.54 kg/m   Physical Exam  Constitutional: She appears well-developed and well-nourished. No distress.  HENT:  Head: Normocephalic.  Eyes: EOM are normal.  Neck: Neck supple.  Cardiovascular: Normal rate.  Pulmonary/Chest: Effort normal.  Musculoskeletal: Normal range of motion.       Right elbow: She exhibits laceration. She exhibits normal range of motion and no swelling. Tenderness found.       Arms: Neurological: She is alert.  Skin: Skin is warm and dry.  Laceration right elbow  Psychiatric: She has a normal mood and affect. Her behavior is normal.  Nursing note and vitals reviewed.    ED Treatments / Results  Labs (all labs ordered are listed, but only abnormal results are displayed) Labs Reviewed - No data to display   Radiology No results found.  Procedures .Marland KitchenLaceration Repair Date/Time: 10/04/2017 7:36 PM Performed by: Ashley Murrain, NP Authorized by: Ashley Murrain, NP   Consent:    Consent obtained:  Verbal   Consent given by:  Patient   Risks discussed:  Infection, pain and poor cosmetic result   Alternatives discussed:  No treatment Anesthesia (see MAR for exact dosages):    Anesthesia method:  Topical application   Topical anesthetic:  LET Laceration details:    Location:  Shoulder/arm   Shoulder/arm location:  R elbow   Length (cm):  1.5 Repair type:    Repair type:  Simple Pre-procedure details:    Preparation:  Patient was prepped and draped in usual sterile fashion and imaging obtained to evaluate for foreign bodies Exploration:    Hemostasis achieved with:  LET   Contaminated: no   Treatment:    Area cleansed with:  Saline   Amount of cleaning:  Standard   Irrigation solution:  Sterile saline   Irrigation method:  Syringe Skin repair:    Repair method:  Sutures   Suture technique:  Simple interrupted   Number of sutures:  3 Approximation:    Approximation:  Close Post-procedure details:    Dressing:  Antibiotic ointment and sterile dressing   Patient tolerance of procedure:  Tolerated well, no immediate complications   (including critical care time)  Medications Ordered in ED Medications  lidocaine-EPINEPHrine-tetracaine (LET) solution (3 mLs Topical Given 10/04/17 1802)  Tdap (BOOSTRIX) injection 0.5 mL (0.5 mLs Intramuscular Given 10/04/17 1801)     Initial Impression / Assessment and Plan / ED Course  I have reviewed the triage vital signs and the nursing notes. 26 y.o. female with laceration to the left elbow while at work today stable for d/c without focal neuro deficits and no immediate complications with wound repair. Patient to f/u in 7 to 10 days for suture removal. Sooner for any problems.   Final Clinical Impressions(s) / ED Diagnoses   Final diagnoses:  Laceration of right elbow, initial encounter    ED Discharge Orders    None       Debroah Baller [place],  [place] 10/04/17 [place], [place], MD 10/06/17 1529

## 2017-10-04 NOTE — ED Triage Notes (Signed)
Pt states that she was at work and sustained a laceration on her rt elbow. Pt is not sure how she obtained the injury, pt reports that she works around a lot of metal. Pt states that she has not had a tetanus shot in many years and is due.  Pt reports stabbing/achey 7/10. Pt does have some mild bleeding coming from the area and is covered with gauze and coband

## 2017-10-12 ENCOUNTER — Emergency Department (HOSPITAL_COMMUNITY)
Admission: EM | Admit: 2017-10-12 | Discharge: 2017-10-12 | Disposition: A | Discharge status: Home / Self Care | Payer: Self-pay | Attending: Emergency Medicine | Admitting: Emergency Medicine

## 2017-10-12 ENCOUNTER — Encounter (HOSPITAL_COMMUNITY): Payer: Self-pay

## 2017-10-12 DIAGNOSIS — Z4802 Encounter for removal of sutures: Secondary | ICD-10-CM | POA: Insufficient documentation

## 2017-10-12 DIAGNOSIS — Z79899 Other long term (current) drug therapy: Secondary | ICD-10-CM | POA: Insufficient documentation

## 2017-10-12 DIAGNOSIS — F17210 Nicotine dependence, cigarettes, uncomplicated: Secondary | ICD-10-CM | POA: Insufficient documentation

## 2017-10-12 NOTE — ED Provider Notes (Signed)
[facility] Provider Note   CSN: [id number] Arrival date & time: 10/12/17  1313     History   Chief Complaint No chief complaint on file.   HPI Maria Sampson is a 26 y.o. female who presents emergency department for suture removal.  Patient had a laceration noted to the right elbow on 10/04/17.  At that time, sutures were placed.  Patient denies any warmth, erythema, drainage, fevers.    The history is provided by the patient.    Past Medical History:  Diagnosis Date  . Depression   . Otitis externa due to Pseudomonas aeruginosa 04/2014    Patient Active Problem List   Diagnosis Date Noted  . Constipation 02/26/2017  . MDD (major depressive disorder), recurrent severe, without psychosis ([place]) 02/26/2017  . Suicide attempt ([place]) 02/20/2017  . Suicide attempt by acetaminophen overdose ([place]) 02/20/2017  . Mild tetrahydrocannabinol (THC) abuse 02/20/2017  . BPV (benign positional vertigo) 10/27/2015  . Malnutrition of moderate degree 10/26/2015  . Abdominal pain 10/23/2015  . Nausea & vomiting 10/23/2015  . Sepsis ([place]) 10/23/2015  . Fibroid, uterine 10/23/2015  . Ileus ([place]) 10/23/2015  . Normocytic anemia 10/23/2015  . Overdose 06/03/2014  . Depressive disorder 06/03/2014  . Suicidal ideation 06/03/2014  . Otitis externa due to Pseudomonas aeruginosa 05/03/2014    Past Surgical History:  Procedure Laterality Date  . DENTAL EXAMINATION UNDER ANESTHESIA      OB History    No data available       Home Medications    Prior to Admission medications   Medication Sig Start Date End Date Taking? Authorizing Provider  naproxen (NAPROSYN) 500 MG tablet Take 1 tablet (500 mg total) by mouth 2 (two) times daily as needed. 06/19/17   Ward, Ozella Almond, PA-C  ondansetron (ZOFRAN) 4 MG tablet Take 1 tablet (4 mg total) by mouth every 6 (six) hours. 03/31/17   Charlann Lange, PA-C  traZODone (DESYREL) 50 MG tablet Take 1 tablet (50  mg total) by mouth at bedtime as needed for sleep. Patient not taking: Reported on 03/30/2017 03/01/17   Lindell Spar I, NP  zolpidem (AMBIEN) 5 MG tablet Take 5 mg by mouth at bedtime as needed for sleep.    [provider]    Family History Family History  Problem Relation Age of Onset  . Crohn's disease Other     Social History Social History   Tobacco Use  . Smoking status: Current Every Day Smoker    Types: Cigars  . Smokeless tobacco: Never Used  . Tobacco comment: 2 per day  Substance Use Topics  . Alcohol use: Yes    Comment: Social  . Drug use: Yes    Types: Marijuana     Allergies   Peanut-containing drug products and Food   Review of Systems Review of Systems  Constitutional: Negative for fever.  Skin: Positive for wound. Negative for color change.     Physical Exam Updated Vital Signs BP (!) 126/94 (BP Location: Left Arm)   Pulse 70   Temp 98.7 F (37.1 C) (Oral)   Resp 16   LMP 09/27/2017   SpO2 99%   Physical Exam  Constitutional: She appears well-developed and well-nourished.  HENT:  Head: Normocephalic and atraumatic.  Eyes: Conjunctivae and EOM are normal. Right eye exhibits no discharge. Left eye exhibits no discharge. No scleral icterus.  Pulmonary/Chest: Effort normal.  Neurological: She is alert.  Skin: Skin is warm and dry.  Small,  healing laceration noted to the right elbow with sutures in place. No surrounding warmth, erythema. No purulent drainage noted.   Psychiatric: She has a normal mood and affect. Her speech is normal and behavior is normal.  Nursing note and vitals reviewed.    ED Treatments / Results  Labs (all labs ordered are listed, but only abnormal results are displayed) Labs Reviewed - No data to display  EKG  EKG Interpretation None       Radiology No results found.  Procedures .Suture Removal Date/Time: 10/12/2017 1:28 PM Performed by: Volanda Napoleon, PA-C Authorized by: Volanda Napoleon, PA-C   Location:    Location:  Upper extremity   Upper extremity location:  Elbow   Elbow location:  R elbow Procedure details:    Wound appearance:  No signs of infection   Number of sutures removed:  3 Post-procedure details:    Post-removal:  Dressing applied   Patient tolerance of procedure:  Tolerated well, no immediate complications   (including critical care time)  Medications Ordered in ED Medications - No data to display   Initial Impression / Assessment and Plan / ED Course  I have reviewed the triage vital signs and the nursing notes.  Pertinent labs & imaging results that were available during my care of the patient were reviewed by me and considered in my medical decision making (see chart for details).     26 year old female who presents for suture removal.  Had a laceration to the right elbow approximately 1 week ago.  No complaints of fever, warmth, redness, purulent drainage noted.  Patient is afebrile, non-toxic appearing, sitting comfortably on examination table.  Physical exam shows healing laceration noted to the right elbow sutures in place.  Sutures removed as documented above.  No surrounding signs of infection.  Wound care instructions discussed with patient. Strict return precautions discussed. Patient expresses understanding and agreement to plan.    Final Clinical Impressions(s) / ED Diagnoses   Final diagnoses:  Visit for suture removal    ED Discharge Orders    None       Volanda Napoleon, PA-C 10/12/17 1328    Daleen Bo, MD 10/13/17 1547

## 2017-10-12 NOTE — Discharge Instructions (Signed)
Continue to keep the area clean and dry.  Monitor for any signs of redness, warmth, drainage from the site.  Return the emergency department for any worsening or concerning symptoms.

## 2017-10-12 NOTE — ED Triage Notes (Signed)
Pt is here to have stiches removed from work related injury to rt arm.

## 2018-01-27 ENCOUNTER — Encounter (HOSPITAL_COMMUNITY): Payer: Self-pay | Admitting: Emergency Medicine

## 2018-01-27 ENCOUNTER — Emergency Department (HOSPITAL_COMMUNITY)
Admission: EM | Admit: 2018-01-27 | Discharge: 2018-01-27 | Disposition: A | Discharge status: Home / Self Care | Payer: Self-pay | Attending: Emergency Medicine | Admitting: Emergency Medicine

## 2018-01-27 DIAGNOSIS — Y939 Activity, unspecified: Secondary | ICD-10-CM | POA: Insufficient documentation

## 2018-01-27 DIAGNOSIS — W268XXA Contact with other sharp object(s), not elsewhere classified, initial encounter: Secondary | ICD-10-CM | POA: Insufficient documentation

## 2018-01-27 DIAGNOSIS — Y999 Unspecified external cause status: Secondary | ICD-10-CM | POA: Insufficient documentation

## 2018-01-27 DIAGNOSIS — F17290 Nicotine dependence, other tobacco product, uncomplicated: Secondary | ICD-10-CM | POA: Insufficient documentation

## 2018-01-27 DIAGNOSIS — Z91010 Allergy to peanuts: Secondary | ICD-10-CM | POA: Insufficient documentation

## 2018-01-27 DIAGNOSIS — Y929 Unspecified place or not applicable: Secondary | ICD-10-CM | POA: Insufficient documentation

## 2018-01-27 DIAGNOSIS — S61412A Laceration without foreign body of left hand, initial encounter: Secondary | ICD-10-CM | POA: Insufficient documentation

## 2018-01-27 MED ORDER — LIDOCAINE HCL (PF) 1 % IJ SOLN
5.0000 mL | Freq: Once | INTRAMUSCULAR | Status: AC
Start: 2018-01-27 — End: 2018-01-27
  Administered 2018-01-27: 5 mL
  Filled 2018-01-27: qty 30

## 2018-01-27 NOTE — ED Provider Notes (Signed)
[facility] DEPT Provider Note   CSN: [id number] Arrival date & time: 01/27/18  1456     History   Chief Complaint Chief Complaint  Patient presents with  . Laceration    HPI Maria Sampson is a 27 y.o. female who presents to the ED with a laceration to the left hand. Patient reports that a piece of stainless steele cut through the glove and cut the dorsum of the left hand. Patient is up to date on tetanus.   HPI  Past Medical History:  Diagnosis Date  . Depression   . Otitis externa due to Pseudomonas aeruginosa 04/2014    Patient Active Problem List   Diagnosis Date Noted  . Constipation 02/26/2017  . MDD (major depressive disorder), recurrent severe, without psychosis ([place]) 02/26/2017  . Suicide attempt ([place]) 02/20/2017  . Suicide attempt by acetaminophen overdose ([place]) 02/20/2017  . Mild tetrahydrocannabinol (THC) abuse 02/20/2017  . BPV (benign positional vertigo) 10/27/2015  . Malnutrition of moderate degree 10/26/2015  . Abdominal pain 10/23/2015  . Nausea & vomiting 10/23/2015  . Sepsis ([place]) 10/23/2015  . Fibroid, uterine 10/23/2015  . Ileus ([place]) 10/23/2015  . Normocytic anemia 10/23/2015  . Overdose 06/03/2014  . Depressive disorder 06/03/2014  . Suicidal ideation 06/03/2014  . Otitis externa due to Pseudomonas aeruginosa 05/03/2014    Past Surgical History:  Procedure Laterality Date  . DENTAL EXAMINATION UNDER ANESTHESIA      OB History    No data available       Home Medications    Prior to Admission medications   Medication Sig Start Date End Date Taking? Authorizing Provider  naproxen (NAPROSYN) 500 MG tablet Take 1 tablet (500 mg total) by mouth 2 (two) times daily as needed. 06/19/17   Ward, Ozella Almond, PA-C  ondansetron (ZOFRAN) 4 MG tablet Take 1 tablet (4 mg total) by mouth every 6 (six) hours. 03/31/17   Charlann Lange, PA-C  traZODone (DESYREL) 50 MG tablet Take 1 tablet (50 mg total) by mouth at  bedtime as needed for sleep. Patient not taking: Reported on 03/30/2017 03/01/17   Lindell Spar I, NP  zolpidem (AMBIEN) 5 MG tablet Take 5 mg by mouth at bedtime as needed for sleep.    [provider]  famotidine (PEPCID) 40 MG tablet Take 1 tablet (40 mg total) by mouth daily. Patient not taking: Reported on 10/30/2015 10/29/15 03/21/16  Donne Hazel, MD    Family History Family History  Problem Relation Age of Onset  . Crohn's disease Other     Social History Social History   Tobacco Use  . Smoking status: Current Every Day Smoker    Types: Cigars  . Smokeless tobacco: Never Used  . Tobacco comment: 2 per day  Substance Use Topics  . Alcohol use: Yes    Comment: Social  . Drug use: Yes    Types: Marijuana     Allergies   Peanut-containing drug products and Food   Review of Systems Review of Systems  Skin: Positive for wound.  All other systems reviewed and are negative.    Physical Exam Updated Vital Signs BP (!) 126/100 (BP Location: Right Arm)   Pulse 80   Temp 97.9 F (36.6 C) (Oral)   Resp 18   LMP 01/13/2018   SpO2 100%   Physical Exam  Constitutional: She appears well-developed and well-nourished. No distress.  HENT:  Head: Normocephalic.  Eyes: EOM are normal.  Neck: Neck supple.  Cardiovascular:  Normal rate.  Pulmonary/Chest: Effort normal.  Musculoskeletal: Normal range of motion.       Left hand: She exhibits laceration. She exhibits normal range of motion, no tenderness, normal capillary refill, no deformity and no swelling. Normal sensation noted. Decreased strength noted. She exhibits no thumb/finger opposition.       Hands: Neurological: She is alert.  Skin: Skin is warm and dry.  Psychiatric: She has a normal mood and affect.  Nursing note and vitals reviewed.    ED Treatments / Results  Labs (all labs ordered are listed, but only abnormal results are displayed) Labs Reviewed - No data to display  Radiology No  results found.  Procedures .Marland KitchenLaceration Repair Date/Time: 01/27/2018 5:01 PM Performed by: Ashley Murrain, NP Authorized by: Ashley Murrain, NP   Consent:    Consent obtained:  Verbal   Consent given by:  Patient   Risks discussed:  Infection   Alternatives discussed:  No treatment Anesthesia (see MAR for exact dosages):    Anesthesia method:  Local infiltration   Local anesthetic:  Lidocaine 1% w/o epi Laceration details:    Location:  Hand   Hand location:  L hand, dorsum   Length (cm):  2 Repair type:    Repair type:  Simple Exploration:    Hemostasis achieved with:  Direct pressure   Wound exploration: entire depth of wound probed and visualized     Contaminated: no   Treatment:    Area cleansed with:  Betadine and saline   Amount of cleaning:  Standard   Irrigation solution:  Sterile saline   Irrigation method:  Syringe Skin repair:    Repair method:  Sutures   Suture size:  5-0   Suture material:  Prolene   Suture technique:  Simple interrupted   Number of sutures:  3 Approximation:    Approximation:  Close Post-procedure details:    Dressing:  Non-adherent dressing   Patient tolerance of procedure:  Tolerated well, no immediate complications   (including critical care time)  Medications Ordered in ED Medications  lidocaine (PF) (XYLOCAINE) 1 % injection 5 mL (not administered)     Initial Impression / Assessment and Plan / ED Course  I have reviewed the triage vital signs and the nursing notes. 27 y.o. female with laceration to the dorsum of the left hand stable for d/c without tendon involvement. Discussed with the patient f/u plan and return  Precautions.   Final Clinical Impressions(s) / ED Diagnoses   Final diagnoses:  Laceration of left hand without foreign body, initial encounter    ED Discharge Orders    None       Debroah Baller Thaxton, NP 01/27/18 1706    Dorie Rank, MD 01/27/18 2309

## 2018-01-27 NOTE — ED Triage Notes (Signed)
patient reports piece of stainless steele cut through her glove on got her knuckle on left hand

## 2018-01-27 NOTE — Discharge Instructions (Signed)
Follow up with your doctor, Urgent Care or here for suture removal in 7 days. Return sooner for any signs of infection.

## 2018-04-13 DIAGNOSIS — F17290 Nicotine dependence, other tobacco product, uncomplicated: Secondary | ICD-10-CM | POA: Insufficient documentation

## 2018-04-13 DIAGNOSIS — M6282 Rhabdomyolysis: Secondary | ICD-10-CM | POA: Insufficient documentation

## 2018-04-13 DIAGNOSIS — Z91010 Allergy to peanuts: Secondary | ICD-10-CM | POA: Insufficient documentation

## 2018-04-14 ENCOUNTER — Emergency Department (HOSPITAL_COMMUNITY)
Admission: EM | Admit: 2018-04-14 | Discharge: 2018-04-14 | Disposition: A | Discharge status: Home / Self Care | Payer: Self-pay | Attending: Emergency Medicine | Admitting: Emergency Medicine

## 2018-04-14 ENCOUNTER — Other Ambulatory Visit: Payer: Self-pay

## 2018-04-14 ENCOUNTER — Encounter (HOSPITAL_COMMUNITY): Payer: Self-pay | Admitting: Emergency Medicine

## 2018-04-14 DIAGNOSIS — M6282 Rhabdomyolysis: Secondary | ICD-10-CM

## 2018-04-14 LAB — BASIC METABOLIC PANEL
ANION GAP: 7 (ref 5–15)
BUN: 14 mg/dL (ref 6–20)
CO2: 25 mmol/L (ref 22–32)
Calcium: 8.9 mg/dL (ref 8.9–10.3)
Chloride: 109 mmol/L (ref 101–111)
Creatinine, Ser: 0.94 mg/dL (ref 0.44–1.00)
GFR calc Af Amer: >60 mL/min (ref >60)
GFR calc non Af Amer: >60 mL/min (ref >60)
GLUCOSE: 93 mg/dL (ref 65–99)
POTASSIUM: 3.8 mmol/L (ref 3.5–5.1)
Sodium: 141 mmol/L (ref 135–145)

## 2018-04-14 LAB — CBC WITH DIFFERENTIAL/PLATELET
BASOS ABS: 0 10*3/uL (ref 0.0–0.1)
Basophils Relative: 0 %
EOS ABS: 0.2 10*3/uL (ref 0.0–0.7)
Eosinophils Relative: 3 %
HCT: 34.2 % — ABNORMAL LOW (ref 36.0–46.0)
Hemoglobin: 11.3 g/dL — ABNORMAL LOW (ref 12.0–15.0)
LYMPHS ABS: 2.6 10*3/uL (ref 0.7–4.0)
LYMPHS PCT: 41 %
MCH: 30.1 pg (ref 26.0–34.0)
MCHC: 33 g/dL (ref 30.0–36.0)
MCV: 91.2 fL (ref 78.0–100.0)
MONOS PCT: 6 %
Monocytes Absolute: 0.4 10*3/uL (ref 0.1–1.0)
NEUTROS ABS: 3.1 10*3/uL (ref 1.7–7.7)
Neutrophils Relative %: 50 %
Platelets: 266 10*3/uL (ref 150–400)
RBC: 3.75 MIL/uL — AB (ref 3.87–5.11)
RDW: 13.7 % (ref 11.5–15.5)
WBC: 6.4 10*3/uL (ref 4.0–10.5)

## 2018-04-14 LAB — URINALYSIS, ROUTINE W REFLEX MICROSCOPIC
BILIRUBIN URINE: NEGATIVE
Glucose, UA: NEGATIVE mg/dL
Ketones, ur: 5 mg/dL — AB
LEUKOCYTES UA: NEGATIVE
NITRITE: NEGATIVE
PROTEIN: NEGATIVE mg/dL
Specific Gravity, Urine: 1.028 (ref 1.005–1.030)
pH: 5 (ref 5.0–8.0)

## 2018-04-14 LAB — I-STAT BETA HCG BLOOD, ED (MC, WL, AP ONLY)

## 2018-04-14 LAB — CK
Total CK: 2938 U/L — ABNORMAL HIGH (ref 38–234)
Total CK: 2270 U/L — ABNORMAL HIGH (ref 38–234)

## 2018-04-14 MED ORDER — KETOROLAC TROMETHAMINE 30 MG/ML IJ SOLN
30.0000 mg | Freq: Once | INTRAMUSCULAR | Status: AC
  Administered 2018-04-14: 30 mg via INTRAVENOUS
  Filled 2018-04-14: qty 1

## 2018-04-14 MED ORDER — TRAMADOL HCL 50 MG PO TABS: 50.0000 mg | ORAL_TABLET | Freq: Four times a day (QID) | ORAL | 0 refills | Status: DC | PRN

## 2018-04-14 MED ORDER — SODIUM CHLORIDE 0.9 % IV BOLUS
1000.0000 mL | Freq: Once | INTRAVENOUS | Status: AC
  Administered 2018-04-14: 1000 mL via INTRAVENOUS

## 2018-04-14 MED ORDER — OXYCODONE-ACETAMINOPHEN 5-325 MG PO TABS
2.0000 | ORAL_TABLET | Freq: Once | ORAL | Status: AC
  Administered 2018-04-14: 2 via ORAL
  Filled 2018-04-14: qty 2

## 2018-04-14 MED ORDER — SODIUM CHLORIDE 0.9 % IV BOLUS
500.0000 mL | Freq: Once | INTRAVENOUS | Status: AC
  Administered 2018-04-14: 500 mL via INTRAVENOUS

## 2018-04-14 NOTE — Discharge Instructions (Signed)
Your pain today is due to muscle breakdown which is causing the aching sensation in your thighs.  This is from dehydration or may have been brought on by a recent viral illness.  We recommend continued aggressive hydration.  Drink at least 2 to 3 L of water daily.  You would benefit from follow-up with a primary care doctor within the week for recheck of your CK level and kidney function.  Take Tylenol as instructed on the bottle for management of pain and discomfort, though symptoms should gradually improve with continued hydration.  You may supplement Tylenol with tramadol as needed.  Should you experience worsening symptoms, you may return to the ED for continued evaluation.

## 2018-04-14 NOTE — ED Notes (Signed)
Pt cannot provide a urine specimen at this time, and does not want to have her blood drawn in the hallway. Also, she doesn't want her legs to be assessed in the hallway.

## 2018-04-14 NOTE — ED Provider Notes (Signed)
[facility] DEPT Provider Note   CSN: [id number] Arrival date & time: 04/13/18  2341    History   Chief Complaint Chief Complaint  Patient presents with  . Leg Swelling    HPI Maria Sampson is a 27 y.o. female.  27 year old female with a history of depression presents to the emergency department for evaluation of bilateral lower extremity pain.  She describes an intermittent pain which is aggravated with activities such as walking, standing, sitting.  She has no pain when she is at rest.  Pain is primarily localized to bilateral inner thighs, worse on the right.  Patient complaining of subjective swelling at sites of her pain, but denies any swelling distal to the knee.  No calf pain with ambulation, history of trauma, fevers, hemoptysis, recent surgeries or hospitalizations.  She has tried soaking in ice and heat without relief.  The patient also reports taking multiple over-the-counter medications with no improvement to her pain.  No personal history of DVT.  The history is provided by the patient. No language interpreter was used.    Past Medical History:  Diagnosis Date  . Depression   . Otitis externa due to Pseudomonas aeruginosa 04/2014    Patient Active Problem List   Diagnosis Date Noted  . Constipation 02/26/2017  . MDD (major depressive disorder), recurrent severe, without psychosis ([place]) 02/26/2017  . Suicide attempt ([place]) 02/20/2017  . Suicide attempt by acetaminophen overdose ([place]) 02/20/2017  . Mild tetrahydrocannabinol (THC) abuse 02/20/2017  . BPV (benign positional vertigo) 10/27/2015  . Malnutrition of moderate degree 10/26/2015  . Abdominal pain 10/23/2015  . Nausea & vomiting 10/23/2015  . Sepsis ([place]) 10/23/2015  . Fibroid, uterine 10/23/2015  . Ileus ([place]) 10/23/2015  . Normocytic anemia 10/23/2015  . Overdose 06/03/2014  . Depressive disorder 06/03/2014  . Suicidal ideation 06/03/2014  . Otitis externa due to  Pseudomonas aeruginosa 05/03/2014    Past Surgical History:  Procedure Laterality Date  . DENTAL EXAMINATION UNDER ANESTHESIA       OB History   None      Home Medications    Prior to Admission medications   Medication Sig Start Date End Date Taking? Authorizing Provider  traMADol (ULTRAM) 50 MG tablet Take 1 tablet (50 mg total) by mouth every 6 (six) hours as needed. 04/14/18   Antonietta Breach, PA-C  traZODone (DESYREL) 50 MG tablet Take 1 tablet (50 mg total) by mouth at bedtime as needed for sleep. Patient not taking: Reported on 03/30/2017 03/01/17   Encarnacion Slates, NP    Family History Family History  Problem Relation Age of Onset  . Crohn's disease Other     Social History Social History   Tobacco Use  . Smoking status: Current Every Day Smoker    Types: Cigars  . Smokeless tobacco: Never Used  . Tobacco comment: 2 per day  Substance Use Topics  . Alcohol use: Yes    Comment: Social  . Drug use: Yes    Types: Marijuana     Allergies   Peanut-containing drug products and Food   Review of Systems Review of Systems Ten systems reviewed and are negative for acute change, except as noted in the HPI.    Physical Exam Updated Vital Signs BP 118/76   Pulse (!) 54   Temp 98.1 F (36.7 C) (Oral)   Resp 18   Ht 5\' 10"  (1.778 m)   Wt 104.3 kg (230 lb)   LMP 04/07/2018  SpO2 100%   BMI 33.00 kg/m   Physical Exam  Constitutional: She is oriented to person, place, and time. She appears well-developed and well-nourished. No distress.  Nontoxic appearing and in no acute distress  HENT:  Head: Normocephalic and atraumatic.  Eyes: Conjunctivae and EOM are normal. No scleral icterus.  Neck: Normal range of motion.  Cardiovascular: Normal rate, regular rhythm and intact distal pulses.  DP pulse 2+ bilaterally  Pulmonary/Chest: Effort normal. No respiratory distress.  Respirations even and unlabored  Musculoskeletal: Normal range of motion. She exhibits  tenderness.  Bilateral lower extremity compartments are soft.  There is tenderness to palpation to the medial thigh bilaterally.  Also some tenderness noted to the right lateral thigh.  No palpable cords or overlying erythema, heat to touch.  Normal range of motion appreciated in bilateral lower extremities.  Neurological: She is alert and oriented to person, place, and time. She exhibits normal muscle tone. Coordination normal.  Sensation to light touch intact.  Patient ambulatory in the department with steady gait.  Skin: Skin is warm and dry. No rash noted. She is not diaphoretic. No erythema. No pallor.  Psychiatric: She has a normal mood and affect. Her behavior is normal.  Nursing note and vitals reviewed.    ED Treatments / Results  Labs (all labs ordered are listed, but only abnormal results are displayed) Labs Reviewed  CBC WITH DIFFERENTIAL/PLATELET - Abnormal; Notable for the following components:      Result Value   RBC 3.75 (*)    Hemoglobin 11.3 (*)    HCT 34.2 (*)    All other components within normal limits  CK - Abnormal; Notable for the following components:   Total CK 2,938 (*)    All other components within normal limits  URINALYSIS, ROUTINE W REFLEX MICROSCOPIC - Abnormal; Notable for the following components:   Hgb urine dipstick LARGE (*)    Ketones, ur 5 (*)    Bacteria, UA RARE (*)    All other components within normal limits  CK - Abnormal; Notable for the following components:   Total CK 2,270 (*)    All other components within normal limits  BASIC METABOLIC PANEL  I-STAT BETA HCG BLOOD, ED ([facility], [facility], [facility] ONLY)    EKG None  Radiology No results found.  Procedures Procedures (including critical care time)  Medications Ordered in ED Medications  ketorolac (TORADOL) 30 MG/ML injection 30 mg (30 mg Intravenous Given 04/14/18 0241)  sodium chloride 0.9 % bolus 1,000 mL (0 mLs Intravenous Stopped 04/14/18 0335)  sodium chloride 0.9 % bolus 1,000 mL (0  mLs Intravenous Stopped 04/14/18 0448)  oxyCODONE-acetaminophen (PERCOCET/ROXICET) 5-325 MG per tablet 2 tablet (2 tablets Oral Given 04/14/18 0334)  sodium chloride 0.9 % bolus 500 mL (500 mLs Intravenous New Bag/Given 04/14/18 0549)     Initial Impression / Assessment and Plan / ED Course  I have reviewed the triage vital signs and the nursing notes.  Pertinent labs & imaging results that were available during my care of the patient were reviewed by me and considered in my medical decision making (see chart for details).     27 year old female presents to the emergency department for complaints of bilateral thigh pain.  This has been worsening over the past few days.  She was initially hypertensive which is thought to be secondary to pain response.  Blood pressure has normalized following administration of Toradol and Percocet.  Thigh pain secondary to mild rhabdomyolysis.  CK elevated at  just over 2900.  This has improved with IV fluids.  The patient has received 2.5 L of fluid in the emergency department.  Rhabdomyolysis is atraumatic in onset, possibly due to preceding viral etiology.  Kidney function is preserved today.  I believe that the patient's rhabdomyolysis can continue to be managed with aggressive outpatient fluid hydration, though she has been instructed to return to the ED for any persistent or worsening pain as this may indicate failed outpatient management.  Encouraged continued use of anti-inflammatories for pain control.  Return precautions discussed and provided. Patient discharged in stable condition with no unaddressed concerns.   Final Clinical Impressions(s) / ED Diagnoses   Final diagnoses:  Non-traumatic rhabdomyolysis    ED Discharge Orders        Ordered    traMADol (ULTRAM) 50 MG tablet  Every 6 hours PRN     04/14/18 0607       Antonietta Breach, PA-C 04/14/18 3729    Ezequiel Essex, MD 04/14/18 [phone]

## 2018-04-14 NOTE — ED Triage Notes (Signed)
Pt reports having swelling and pain to bilateral thighs that started yesterday. Pt reports pain when walking or standing. Denies any recent injury or extreme activity.

## 2018-06-24 ENCOUNTER — Other Ambulatory Visit: Payer: Self-pay

## 2018-06-24 ENCOUNTER — Encounter (HOSPITAL_COMMUNITY): Payer: Self-pay

## 2018-06-24 DIAGNOSIS — F1210 Cannabis abuse, uncomplicated: Secondary | ICD-10-CM | POA: Insufficient documentation

## 2018-06-24 DIAGNOSIS — F17290 Nicotine dependence, other tobacco product, uncomplicated: Secondary | ICD-10-CM | POA: Insufficient documentation

## 2018-06-24 DIAGNOSIS — Z91010 Allergy to peanuts: Secondary | ICD-10-CM | POA: Insufficient documentation

## 2018-06-24 DIAGNOSIS — M5431 Sciatica, right side: Secondary | ICD-10-CM | POA: Insufficient documentation

## 2018-06-24 NOTE — ED Triage Notes (Signed)
Pt presents to ED from home for back pain. Pt reports that her back started hurting over the weekend, and has gotten worse. Pt reporting pain down her R leg. Pt tried heat and tylenol with no relief.

## 2018-06-25 ENCOUNTER — Emergency Department (HOSPITAL_COMMUNITY): Payer: Self-pay

## 2018-06-25 ENCOUNTER — Emergency Department (HOSPITAL_COMMUNITY)
Admission: EM | Admit: 2018-06-25 | Discharge: 2018-06-25 | Disposition: A | Discharge status: Home / Self Care | Payer: Self-pay | Attending: Emergency Medicine | Admitting: Emergency Medicine

## 2018-06-25 DIAGNOSIS — M5431 Sciatica, right side: Secondary | ICD-10-CM

## 2018-06-25 LAB — URINALYSIS, ROUTINE W REFLEX MICROSCOPIC
BILIRUBIN URINE: NEGATIVE
Glucose, UA: NEGATIVE mg/dL
Hgb urine dipstick: NEGATIVE
Ketones, ur: NEGATIVE mg/dL
Leukocytes, UA: NEGATIVE
Nitrite: NEGATIVE
PH: 6 (ref 5.0–8.0)
Protein, ur: NEGATIVE mg/dL
SPECIFIC GRAVITY, URINE: 1.024 (ref 1.005–1.030)

## 2018-06-25 LAB — I-STAT BETA HCG BLOOD, ED (MC, WL, AP ONLY): I-stat hCG, quantitative: <5 m[IU]/mL (ref <5)

## 2018-06-25 MED ORDER — TRAMADOL HCL 50 MG PO TABS: 50.0000 mg | ORAL_TABLET | Freq: Four times a day (QID) | ORAL | 0 refills | Status: DC | PRN

## 2018-06-25 MED ORDER — PREDNISONE 20 MG PO TABS: ORAL_TABLET | ORAL | 0 refills | Status: DC

## 2018-06-25 MED ORDER — NAPROXEN 500 MG PO TABS: 500.0000 mg | ORAL_TABLET | Freq: Two times a day (BID) | ORAL | 0 refills | Status: DC

## 2018-06-25 MED ORDER — HYDROCODONE-ACETAMINOPHEN 5-325 MG PO TABS
1.0000 | ORAL_TABLET | Freq: Once | ORAL | Status: AC
  Administered 2018-06-25: 1 via ORAL
  Filled 2018-06-25: qty 1

## 2018-06-25 MED ORDER — PREDNISONE 20 MG PO TABS
60.0000 mg | ORAL_TABLET | Freq: Once | ORAL | Status: AC
  Administered 2018-06-25: 60 mg via ORAL
  Filled 2018-06-25: qty 3

## 2018-06-25 NOTE — ED Provider Notes (Signed)
[facility] DEPT Provider Note   CSN: [id number] Arrival date & time: 06/24/18  2257     History   Chief Complaint Chief Complaint  Patient presents with  . Back Pain    HPI Maria Sampson is a 27 y.o. female.  Patient presents to the emergency department for evaluation of back pain.  Patient reports that symptoms began approximately 1 week ago.  Initially it was diffuse pain across her back, she thought she simply slept wrong.  Since then, pain has worsened.  Pain is now more on the right side and is radiating down her right leg.  She does not have any leg weakness.  No change in bowel or bladder function.  Patient reports that she feels better during the day.  When she is up and walking she does not have pain, when she sits down, bends over or lies down the pain occurs.  She denies injury.     Past Medical History:  Diagnosis Date  . Depression   . Otitis externa due to Pseudomonas aeruginosa 04/2014    Patient Active Problem List   Diagnosis Date Noted  . Constipation 02/26/2017  . MDD (major depressive disorder), recurrent severe, without psychosis ([place]) 02/26/2017  . Suicide attempt ([place]) 02/20/2017  . Suicide attempt by acetaminophen overdose ([place]) 02/20/2017  . Mild tetrahydrocannabinol (THC) abuse 02/20/2017  . BPV (benign positional vertigo) 10/27/2015  . Malnutrition of moderate degree 10/26/2015  . Abdominal pain 10/23/2015  . Nausea & vomiting 10/23/2015  . Sepsis ([place]) 10/23/2015  . Fibroid, uterine 10/23/2015  . Ileus ([place]) 10/23/2015  . Normocytic anemia 10/23/2015  . Overdose 06/03/2014  . Depressive disorder 06/03/2014  . Suicidal ideation 06/03/2014  . Otitis externa due to Pseudomonas aeruginosa 05/03/2014    Past Surgical History:  Procedure Laterality Date  . DENTAL EXAMINATION UNDER ANESTHESIA       OB History   None      Home Medications    Prior to Admission medications   Medication Sig Start Date  End Date Taking? Authorizing Provider  acetaminophen (TYLENOL) 500 MG tablet Take 1,000 mg by mouth every 6 (six) hours as needed for moderate pain.   Yes [provider]  naproxen (NAPROSYN) 500 MG tablet Take 1 tablet (500 mg total) by mouth 2 (two) times daily. 06/25/18   Onofrio Klemp, Gwenyth [name], MD  predniSONE (DELTASONE) 20 MG tablet 3 tabs po daily x 3 days, then 2 tabs x 3 days, then 1.5 tabs x 3 days, then 1 tab x 3 days, then 0.5 tabs x 3 days 06/25/18   Orpah Greek, MD  traMADol (ULTRAM) 50 MG tablet Take 1 tablet (50 mg total) by mouth every 6 (six) hours as needed. 06/25/18   Orpah Greek, MD    Family History Family History  Problem Relation Age of Onset  . Crohn's disease Other     Social History Social History   Tobacco Use  . Smoking status: Current Every Day Smoker    Types: Cigars  . Smokeless tobacco: Never Used  . Tobacco comment: 2 per day  Substance Use Topics  . Alcohol use: Yes    Comment: Social  . Drug use: Yes    Types: Marijuana     Allergies   Peanut-containing drug products and Food   Review of Systems Review of Systems  Musculoskeletal: Positive for back pain.  All other systems reviewed and are negative.    Physical Exam Updated Vital Signs  BP (!) 143/77 (BP Location: Left Arm)   Pulse 74   Temp 98.1 F (36.7 C) (Oral)   Resp 19   Ht 5\' 10"  (1.778 m)   Wt 88.5 kg (195 lb)   LMP 06/13/2018 Comment: neg preg test  SpO2 100%   BMI 27.98 kg/m   Physical Exam  Constitutional: She is oriented to person, place, and time. She appears well-developed and well-nourished. No distress.  HENT:  Head: Normocephalic and atraumatic.  Right Ear: Hearing normal.  Left Ear: Hearing normal.  Nose: Nose normal.  Mouth/Throat: Oropharynx is clear and moist and mucous membranes are normal.  Eyes: Pupils are equal, round, and reactive to light. Conjunctivae and EOM are normal.  Neck: Normal range of motion. Neck supple.    Cardiovascular: Regular rhythm, S1 normal and S2 normal. Exam reveals no gallop and no friction rub.  No murmur heard. Pulmonary/Chest: Effort normal and breath sounds normal. No respiratory distress. She exhibits no tenderness.  Abdominal: Soft. Normal appearance and bowel sounds are normal. There is no hepatosplenomegaly. There is no tenderness. There is no rebound, no guarding, no tenderness at McBurney's point and negative Murphy's sign. No hernia.  Musculoskeletal: Normal range of motion.  Neurological: She is alert and oriented to person, place, and time. She has normal strength. No cranial nerve deficit or sensory deficit. Coordination normal. GCS eye subscore is 4. GCS verbal subscore is 5. GCS motor subscore is 6.  Normal strength bilateral lower extremities, normal sensation.  No foot drop.  No saddle anesthesia.  Skin: Skin is warm, dry and intact. No rash noted. No cyanosis.  Psychiatric: She has a normal mood and affect. Her speech is normal and behavior is normal. Thought content normal.  Nursing note and vitals reviewed.    ED Treatments / Results  Labs (all labs ordered are listed, but only abnormal results are displayed) Labs Reviewed  URINALYSIS, ROUTINE W REFLEX MICROSCOPIC  I-STAT BETA HCG BLOOD, ED ([facility], [facility], [facility] ONLY)    EKG None  Radiology Dg Lumbar Spine Complete  Result Date: 06/25/2018 CLINICAL DATA:  Acute onset of lower back pain, radiating down the right leg. Initial encounter. EXAM: LUMBAR SPINE - COMPLETE 4+ VIEW COMPARISON:  CT of the abdomen and pelvis from 10/28/2015 FINDINGS: There is no evidence of fracture or subluxation. Vertebral bodies demonstrate normal height and alignment. Intervertebral disc spaces are preserved. The visualized neural foramina are grossly unremarkable in appearance. The visualized bowel gas pattern is unremarkable in appearance; air and stool are noted within the colon. The sacroiliac joints are within normal limits. IMPRESSION: No  evidence of fracture or subluxation along the lumbar spine. Electronically Signed   By: Garald Balding M.D.   On: 06/25/2018 01:39    Procedures Procedures (including critical care time)  Medications Ordered in ED Medications  predniSONE (DELTASONE) tablet 60 mg (has no administration in time range)  HYDROcodone-acetaminophen (NORCO/VICODIN) 5-325 MG per tablet 1 tablet (has no administration in time range)     Initial Impression / Assessment and Plan / ED Course  I have reviewed the triage vital signs and the nursing notes.  Pertinent labs & imaging results that were available during my care of the patient were reviewed by me and considered in my medical decision making (see chart for details).     Patient presents to the emergency department for evaluation of nontraumatic back pain.  Pain is been present for a week.  It started as diffuse back pain, now more focal  on the right side with radiation to the right leg.  She has a normal neurologic evaluation.  No focal deficits, no foot drop, no saddle anesthesia.  Lumbar x-ray is normal.  Urinalysis is unremarkable.  Patient will be treated with rest and analgesia.  Final Clinical Impressions(s) / ED Diagnoses   Final diagnoses:  Sciatica of right side    ED Discharge Orders        Ordered    predniSONE (DELTASONE) 20 MG tablet     06/25/18 0225    naproxen (NAPROSYN) 500 MG tablet  2 times daily     06/25/18 0225    traMADol (ULTRAM) 50 MG tablet  Every 6 hours PRN     06/25/18 0225       Orpah Greek, MD 06/25/18 [phone]

## 2018-10-30 ENCOUNTER — Emergency Department (HOSPITAL_COMMUNITY)
Admission: EM | Admit: 2018-10-30 | Discharge: 2018-10-30 | Disposition: A | Discharge status: Home / Self Care | Payer: Medicaid Other | Attending: Emergency Medicine | Admitting: Emergency Medicine

## 2018-10-30 ENCOUNTER — Other Ambulatory Visit: Payer: Self-pay

## 2018-10-30 ENCOUNTER — Emergency Department (HOSPITAL_COMMUNITY): Payer: Medicaid Other

## 2018-10-30 ENCOUNTER — Encounter (HOSPITAL_COMMUNITY): Payer: Self-pay

## 2018-10-30 DIAGNOSIS — R1031 Right lower quadrant pain: Secondary | ICD-10-CM

## 2018-10-30 DIAGNOSIS — N83201 Unspecified ovarian cyst, right side: Secondary | ICD-10-CM

## 2018-10-30 DIAGNOSIS — F17290 Nicotine dependence, other tobacco product, uncomplicated: Secondary | ICD-10-CM | POA: Insufficient documentation

## 2018-10-30 DIAGNOSIS — Z91010 Allergy to peanuts: Secondary | ICD-10-CM | POA: Insufficient documentation

## 2018-10-30 LAB — CBC
HCT: 39.6 % (ref 36.0–46.0)
Hemoglobin: 12.9 g/dL (ref 12.0–15.0)
MCH: 30.3 pg (ref 26.0–34.0)
MCHC: 32.6 g/dL (ref 30.0–36.0)
MCV: 93 fL (ref 80.0–100.0)
Platelets: 256 10*3/uL (ref 150–400)
RBC: 4.26 MIL/uL (ref 3.87–5.11)
RDW: 13.6 % (ref 11.5–15.5)
WBC: 7.5 10*3/uL (ref 4.0–10.5)
nRBC: 0 % (ref 0.0–0.2)

## 2018-10-30 LAB — URINALYSIS, ROUTINE W REFLEX MICROSCOPIC
BILIRUBIN URINE: NEGATIVE
Bacteria, UA: NONE SEEN
Glucose, UA: NEGATIVE mg/dL
HGB URINE DIPSTICK: NEGATIVE
Ketones, ur: 5 mg/dL — AB
Nitrite: NEGATIVE
Protein, ur: NEGATIVE mg/dL
Specific Gravity, Urine: 1.013 (ref 1.005–1.030)
pH: 5 (ref 5.0–8.0)

## 2018-10-30 LAB — COMPREHENSIVE METABOLIC PANEL
ALT: 18 U/L (ref 0–44)
AST: 23 U/L (ref 15–41)
Albumin: 4.5 g/dL (ref 3.5–5.0)
Alkaline Phosphatase: 63 U/L (ref 38–126)
Anion gap: 6 (ref 5–15)
BUN: 12 mg/dL (ref 6–20)
CO2: 25 mmol/L (ref 22–32)
Calcium: 9.1 mg/dL (ref 8.9–10.3)
Chloride: 106 mmol/L (ref 98–111)
Creatinine, Ser: 0.94 mg/dL (ref 0.44–1.00)
GFR calc Af Amer: >60 mL/min (ref >60)
GFR calc non Af Amer: >60 mL/min (ref >60)
GLUCOSE: 96 mg/dL (ref 70–99)
Potassium: 3.8 mmol/L (ref 3.5–5.1)
SODIUM: 137 mmol/L (ref 135–145)
Total Bilirubin: 1.6 mg/dL — ABNORMAL HIGH (ref 0.3–1.2)
Total Protein: 6.9 g/dL (ref 6.5–8.1)

## 2018-10-30 LAB — LIPASE, BLOOD: LIPASE: 52 U/L — AB (ref 11–51)

## 2018-10-30 LAB — I-STAT BETA HCG BLOOD, ED (MC, WL, AP ONLY): I-stat hCG, quantitative: <5 m[IU]/mL (ref <5)

## 2018-10-30 MED ORDER — OXYCODONE-ACETAMINOPHEN 5-325 MG PO TABS: 1.0000 | ORAL_TABLET | ORAL | 0 refills | Status: DC | PRN

## 2018-10-30 MED ORDER — MORPHINE SULFATE (PF) 4 MG/ML IV SOLN
4.0000 mg | Freq: Once | INTRAVENOUS | Status: AC
  Administered 2018-10-30: 4 mg via INTRAVENOUS
  Filled 2018-10-30: qty 1

## 2018-10-30 MED ORDER — ONDANSETRON HCL 4 MG/2ML IJ SOLN
4.0000 mg | Freq: Once | INTRAMUSCULAR | Status: AC
  Administered 2018-10-30: 4 mg via INTRAVENOUS
  Filled 2018-10-30: qty 2

## 2018-10-30 MED ORDER — SODIUM CHLORIDE (PF) 0.9 % IJ SOLN
INTRAMUSCULAR | Status: AC
  Filled 2018-10-30: qty 50

## 2018-10-30 MED ORDER — SODIUM CHLORIDE 0.9 % IV BOLUS
500.0000 mL | Freq: Once | INTRAVENOUS | Status: AC
  Administered 2018-10-30: 500 mL via INTRAVENOUS

## 2018-10-30 MED ORDER — IOPAMIDOL (ISOVUE-300) INJECTION 61%
100.0000 mL | Freq: Once | INTRAVENOUS | Status: AC | PRN
  Administered 2018-10-30: 100 mL via INTRAVENOUS

## 2018-10-30 MED ORDER — SODIUM CHLORIDE 0.9 % IV BOLUS
1000.0000 mL | Freq: Once | INTRAVENOUS | Status: AC
  Administered 2018-10-30: 1000 mL via INTRAVENOUS

## 2018-10-30 MED ORDER — IOPAMIDOL (ISOVUE-300) INJECTION 61%
INTRAVENOUS | Status: AC
  Filled 2018-10-30: qty 100

## 2018-10-30 MED ORDER — HYDROMORPHONE HCL 1 MG/ML IJ SOLN
1.0000 mg | Freq: Once | INTRAMUSCULAR | Status: AC
  Administered 2018-10-30: 1 mg via INTRAVENOUS
  Filled 2018-10-30: qty 1

## 2018-10-30 MED ORDER — ONDANSETRON HCL 4 MG PO TABS: 4.0000 mg | ORAL_TABLET | Freq: Four times a day (QID) | ORAL | 0 refills | Status: DC

## 2018-10-30 NOTE — ED Provider Notes (Signed)
[facility] DEPT Provider Note   CSN: [id number] Arrival date & time: 10/30/18  1222     History   Chief Complaint Chief Complaint  Patient presents with  . Abdominal Pain  . Nausea    HPI Maria Sampson is a 27 y.o. female.  Right lower quadrant pain rating to the right lower back since yesterday associated sensation of feeling hot and cold.  No dysuria, hematuria, frequency, vaginal bleeding, vaginal discharge.  Last menstrual period 3 weeks ago.  She states she is normally healthy.  Past medical history reviewed and significant for major depressive disorder.  Severity of pain is moderate.  Palpation makes pain worse.     Past Medical History:  Diagnosis Date  . Depression   . Otitis externa due to Pseudomonas aeruginosa 04/2014    Patient Active Problem List   Diagnosis Date Noted  . Constipation 02/26/2017  . MDD (major depressive disorder), recurrent severe, without psychosis ([place]) 02/26/2017  . Suicide attempt ([place]) 02/20/2017  . Suicide attempt by acetaminophen overdose ([place]) 02/20/2017  . Mild tetrahydrocannabinol (THC) abuse 02/20/2017  . BPV (benign positional vertigo) 10/27/2015  . Malnutrition of moderate degree 10/26/2015  . Abdominal pain 10/23/2015  . Nausea & vomiting 10/23/2015  . Sepsis ([place]) 10/23/2015  . Fibroid, uterine 10/23/2015  . Ileus ([place]) 10/23/2015  . Normocytic anemia 10/23/2015  . Overdose 06/03/2014  . Depressive disorder 06/03/2014  . Suicidal ideation 06/03/2014  . Otitis externa due to Pseudomonas aeruginosa 05/03/2014    Past Surgical History:  Procedure Laterality Date  . DENTAL EXAMINATION UNDER ANESTHESIA       OB History   No obstetric history on file.      Home Medications    Prior to Admission medications   Medication Sig Start Date End Date Taking? Authorizing Provider  naproxen (NAPROSYN) 500 MG tablet Take 1 tablet (500 mg total) by mouth 2 (two) times daily. Patient not  taking: Reported on 10/30/2018 06/25/18   Orpah Greek, MD  ondansetron (ZOFRAN) 4 MG tablet Take 1 tablet (4 mg total) by mouth every 6 (six) hours. 10/30/18   Nat Christen, MD  oxyCODONE-acetaminophen (PERCOCET/ROXICET) 5-325 MG tablet Take 1 tablet by mouth every 4 (four) hours as needed for severe pain. 10/30/18   Nat Christen, MD  predniSONE (DELTASONE) 20 MG tablet 3 tabs po daily x 3 days, then 2 tabs x 3 days, then 1.5 tabs x 3 days, then 1 tab x 3 days, then 0.5 tabs x 3 days Patient not taking: Reported on 10/30/2018 06/25/18   Orpah Greek, MD  traMADol (ULTRAM) 50 MG tablet Take 1 tablet (50 mg total) by mouth every 6 (six) hours as needed. Patient not taking: Reported on 10/30/2018 06/25/18   Orpah Greek, MD    Family History Family History  Problem Relation Age of Onset  . Crohn's disease Other     Social History Social History   Tobacco Use  . Smoking status: Current Every Day Smoker    Types: Cigars  . Smokeless tobacco: Never Used  . Tobacco comment: 2 per day  Substance Use Topics  . Alcohol use: Yes    Comment: Social  . Drug use: Yes    Types: Marijuana     Allergies   Peanut-containing drug products and Food   Review of Systems Review of Systems  All other systems reviewed and are negative.    Physical Exam Updated Vital Signs BP 133/65 (BP Location: Left  Arm)   Pulse (!) 59   Temp 98.4 F (36.9 C) (Oral)   Resp 16   Ht 5\' 10"  (1.778 m)   Wt 88.5 kg   LMP 10/07/2018 (Approximate)   SpO2 100%   BMI 27.98 kg/m   Physical Exam Vitals signs and nursing note reviewed.  Constitutional:      Appearance: She is well-developed.  HENT:     Head: Normocephalic and atraumatic.  Eyes:     Conjunctiva/sclera: Conjunctivae normal.  Neck:     Musculoskeletal: Neck supple.  Cardiovascular:     Rate and Rhythm: Normal rate and regular rhythm.  Pulmonary:     Effort: Pulmonary effort is normal.     Breath sounds: Normal  breath sounds.  Abdominal:     General: Bowel sounds are normal.     Palpations: Abdomen is soft.     Comments: Tender right lower quadrant  Musculoskeletal: Normal range of motion.  Skin:    General: Skin is warm and dry.  Neurological:     Mental Status: She is alert and oriented to person, place, and time.  Psychiatric:        Behavior: Behavior normal.      ED Treatments / Results  Labs (all labs ordered are listed, but only abnormal results are displayed) Labs Reviewed  LIPASE, BLOOD - Abnormal; Notable for the following components:      Result Value   Lipase 52 (*)    All other components within normal limits  COMPREHENSIVE METABOLIC PANEL - Abnormal; Notable for the following components:   Total Bilirubin 1.6 (*)    All other components within normal limits  URINALYSIS, ROUTINE W REFLEX MICROSCOPIC - Abnormal; Notable for the following components:   Ketones, ur 5 (*)    Leukocytes, UA TRACE (*)    All other components within normal limits  CBC  I-STAT BETA HCG BLOOD, ED ([facility], [facility], [facility] ONLY)    EKG None  Radiology Ct Abdomen Pelvis W Contrast  Result Date: 10/30/2018 CLINICAL DATA:  Acute abdominal pain particularly the right lower quadrant EXAM: CT ABDOMEN AND PELVIS WITH CONTRAST TECHNIQUE: Multidetector CT imaging of the abdomen and pelvis was performed using the standard protocol following bolus administration of intravenous contrast. CONTRAST:  140mL ISOVUE-300 IOPAMIDOL (ISOVUE-300) INJECTION 61% COMPARISON:  CT abdomen pelvis of 10/28/2015 and 05/10/2015 FINDINGS: Lower chest: A small nodule of 5 mm in diameter which is noncalcified is abutting the pleura in the posterior left lower lobe is unchanged compared to the CT from 05/10/2015 but obscured on the interval CT. Otherwise the lung bases are clear. Hepatobiliary: The liver enhances with no focal abnormality and no ductal dilatation is seen. No calcified gallstones are noted. Pancreas: The pancreas is normal in  size and the pancreatic duct is not dilated. Spleen: The spleen is unremarkable. Adrenals/Urinary Tract: The adrenal glands appear normal. The kidneys enhance with no calculus or mass and no hydronephrosis is seen. The ureters appear normal in caliber. The urinary bladder is not well distended and can not be evaluated. Stomach/Bowel: The stomach is slightly distended with fluid but no abnormality is seen. No dilatation of small bowel or small bowel edema is evident. There is some feces throughout the colon. The terminal ileum is arm remarkable. The appendix is better seen on coronal images and fills with air normally. Vascular/Lymphatic: The abdominal aorta is normal in caliber. No adenopathy is seen. Reproductive: The uterus is normal in size. Small follicles are present bilaterally. There appears  to be a collapsing cyst on the right with some free fluid in the pelvis. This may well account for the patient's symptoms. Other: No abdominal wall hernia is seen. Musculoskeletal: The lumbar vertebrae are in normal alignment with normal intervertebral disc spaces. The SI joints appear well corticated. IMPRESSION: 1. Small to moderate amount of free fluid in the pelvis with apparent collapsing right ovarian cyst possibly corpus luteum cyst on the right. These findings may well account for the patient's current symptoms. 2. The appendix and terminal ileum are unremarkable. 3. Stable 5 mm noncalcified nodule in the left lower lobe compared to the CT from 05/10/2015, consistent with a benign process. Electronically Signed   By: Ivar Drape M.D.   On: 10/30/2018 14:16    Procedures Procedures (including critical care time)  Medications Ordered in ED Medications  iopamidol (ISOVUE-300) 61 % injection (has no administration in time range)  sodium chloride (PF) 0.9 % injection (has no administration in time range)  sodium chloride 0.9 % bolus 500 mL (500 mLs Intravenous New Bag/Given 10/30/18 1534)  ondansetron  (ZOFRAN) injection 4 mg (4 mg Intravenous Given 10/30/18 1331)  morphine 4 MG/ML injection 4 mg (4 mg Intravenous Given 10/30/18 1333)  sodium chloride 0.9 % bolus 1,000 mL (1,000 mLs Intravenous New Bag/Given 10/30/18 1331)  iopamidol (ISOVUE-300) 61 % injection 100 mL (100 mLs Intravenous Contrast Given 10/30/18 1400)  HYDROmorphone (DILAUDID) injection 1 mg (1 mg Intravenous Given 10/30/18 1533)  ondansetron (ZOFRAN) injection 4 mg (4 mg Intravenous Given 10/30/18 1533)     Initial Impression / Assessment and Plan / ED Course  I have reviewed the triage vital signs and the nursing notes.  Pertinent labs & imaging results that were available during my care of the patient were reviewed by me and considered in my medical decision making (see chart for details).     Patient presents with right lower quadrant pain.  White count normal.  Lipase minimally elevated.  CT reveals a small to moderate amount of free fluid in the pelvis with a collapsing right ovarian cyst.  No evidence of appendicitis.  This most likely explains patient's symptoms.  She was treated with IV fluids and pain management.  No acute abdomen at discharge.  Discharge medication Percocet and Zofran 4 mg.  Final Clinical Impressions(s) / ED Diagnoses   Final diagnoses:  Right lower quadrant pain  Cyst of right ovary    ED Discharge Orders         Ordered    oxyCODONE-acetaminophen (PERCOCET/ROXICET) 5-325 MG tablet  Every 4 hours PRN     10/30/18 1526    ondansetron (ZOFRAN) 4 MG tablet  Every 6 hours     10/30/18 1526           Nat Christen, MD 10/30/18 1538

## 2018-10-30 NOTE — Discharge Instructions (Signed)
No obvious appendicitis on CT scan.  You do have a ruptured ovarian cyst.  Medication for pain and nausea.  You can also take over-the-counter ibuprofen.  Follow-up at [facility] if worse.

## 2018-10-30 NOTE — ED Notes (Signed)
Pt is aware urine sample is needed. 

## 2018-10-30 NOTE — ED Notes (Signed)
Pt from home c/o rt lower abdominal pain and nausea since yesterday. Pt denies diarrhea. Pt states she has not been able to eat or drink since yesterday.

## 2018-10-30 NOTE — ED Triage Notes (Signed)
Patient c/o RLQ abdominal pain and nausea since last night.

## 2018-11-26 ENCOUNTER — Encounter (HOSPITAL_COMMUNITY): Payer: Self-pay | Admitting: Emergency Medicine

## 2018-11-26 ENCOUNTER — Emergency Department (HOSPITAL_COMMUNITY)
Admission: EM | Admit: 2018-11-26 | Discharge: 2018-11-26 | Disposition: A | Discharge status: Home / Self Care | Payer: Medicaid Other | Attending: Emergency Medicine | Admitting: Emergency Medicine

## 2018-11-26 DIAGNOSIS — J020 Streptococcal pharyngitis: Secondary | ICD-10-CM

## 2018-11-26 DIAGNOSIS — Z91010 Allergy to peanuts: Secondary | ICD-10-CM | POA: Insufficient documentation

## 2018-11-26 DIAGNOSIS — F17290 Nicotine dependence, other tobacco product, uncomplicated: Secondary | ICD-10-CM | POA: Insufficient documentation

## 2018-11-26 MED ORDER — PENICILLIN G BENZATHINE 1200000 UNIT/2ML IM SUSP
1.2000 10*6.[IU] | Freq: Once | INTRAMUSCULAR | Status: AC
  Administered 2018-11-26: 1.2 10*6.[IU] via INTRAMUSCULAR
  Filled 2018-11-26: qty 2

## 2018-11-26 NOTE — ED Notes (Signed)
Bed: WA05 Expected date:  Expected time:  Means of arrival:  Comments: 

## 2018-11-26 NOTE — ED Triage Notes (Signed)
Pt c/o cough that is sometimes productive. Pt reports that had sore throat for over week.

## 2018-11-26 NOTE — Discharge Instructions (Addendum)
You were treated today for strep pharyngitis symptoms should improve to resolve in the next few days. Please follow up with your primary care as needed.

## 2018-11-26 NOTE — ED Provider Notes (Signed)
[facility] DEPT Provider Note   CSN: [id number] Arrival date & time: 11/26/18  0845     History   Chief Complaint Chief Complaint  Patient presents with  . Sore Throat  . Cough    HPI Maria Sampson is a 28 y.o. female.  28 y.o female with a PMH of depression presents to the ED with a chief complaint of sore throat x 1 week.She reports symptoms with a slight tickle in her throat and now have developed into pain with swallowing along with a dry cough which is nonproductive.  She reports today she felt symptoms are worsening as she was having pain with her own secretions.  She reports eating tea, over-the-counter medication to soothe her throat but states no improvement in symptoms.  She currently has a 66-year-old at home who has recently been sick as well.  Nuys any shortness of breath, fever, or chest pain.     Past Medical History:  Diagnosis Date  . Depression   . Otitis externa due to Pseudomonas aeruginosa 04/2014    Patient Active Problem List   Diagnosis Date Noted  . Constipation 02/26/2017  . MDD (major depressive disorder), recurrent severe, without psychosis ([place]) 02/26/2017  . Suicide attempt ([place]) 02/20/2017  . Suicide attempt by acetaminophen overdose ([place]) 02/20/2017  . Mild tetrahydrocannabinol (THC) abuse 02/20/2017  . BPV (benign positional vertigo) 10/27/2015  . Malnutrition of moderate degree 10/26/2015  . Abdominal pain 10/23/2015  . Nausea & vomiting 10/23/2015  . Sepsis ([place]) 10/23/2015  . Fibroid, uterine 10/23/2015  . Ileus ([place]) 10/23/2015  . Normocytic anemia 10/23/2015  . Overdose 06/03/2014  . Depressive disorder 06/03/2014  . Suicidal ideation 06/03/2014  . Otitis externa due to Pseudomonas aeruginosa 05/03/2014    Past Surgical History:  Procedure Laterality Date  . DENTAL EXAMINATION UNDER ANESTHESIA       OB History   No obstetric history on file.      Home Medications    Prior to Admission  medications   Medication Sig Start Date End Date Taking? Authorizing Provider  naproxen (NAPROSYN) 500 MG tablet Take 1 tablet (500 mg total) by mouth 2 (two) times daily. Patient not taking: Reported on 10/30/2018 06/25/18   Orpah Greek, MD  ondansetron (ZOFRAN) 4 MG tablet Take 1 tablet (4 mg total) by mouth every 6 (six) hours. Patient not taking: Reported on 11/26/2018 10/30/18   Nat Christen, MD  oxyCODONE-acetaminophen (PERCOCET/ROXICET) 5-325 MG tablet Take 1 tablet by mouth every 4 (four) hours as needed for severe pain. Patient not taking: Reported on 11/26/2018 10/30/18   Nat Christen, MD  predniSONE (DELTASONE) 20 MG tablet 3 tabs po daily x 3 days, then 2 tabs x 3 days, then 1.5 tabs x 3 days, then 1 tab x 3 days, then 0.5 tabs x 3 days Patient not taking: Reported on 10/30/2018 06/25/18   Orpah Greek, MD  traMADol (ULTRAM) 50 MG tablet Take 1 tablet (50 mg total) by mouth every 6 (six) hours as needed. Patient not taking: Reported on 10/30/2018 06/25/18   Orpah Greek, MD    Family History Family History  Problem Relation Age of Onset  . Crohn's disease Other     Social History Social History   Tobacco Use  . Smoking status: Current Every Day Smoker    Types: Cigars  . Smokeless tobacco: Never Used  . Tobacco comment: 2 per day  Substance Use Topics  . Alcohol use: Yes  Comment: Social  . Drug use: Yes    Types: Marijuana     Allergies   Peanut-containing drug products   Review of Systems Review of Systems  Constitutional: Negative for fever.  HENT: Positive for sore throat. Negative for postnasal drip, rhinorrhea, sinus pressure and sinus pain.   Respiratory: Positive for cough.      Physical Exam Updated Vital Signs BP (!) 139/91 (BP Location: Left Arm)   Pulse 89   Temp 98.5 F (36.9 C) (Oral)   Resp 17   LMP 10/29/2018   SpO2 99%   Physical Exam Vitals signs and nursing note reviewed.  Constitutional:      General:  She is not in acute distress.    Appearance: She is well-developed.  HENT:     Head: Normocephalic and atraumatic.     Mouth/Throat:     Mouth: Mucous membranes are moist.     Pharynx: No oropharyngeal exudate.     Tonsils: Tonsillar exudate present. Swelling: 1+ on the right. 1+ on the left.     Comments: Bilateral tonsilar exudates. No tonsillar abscess.  Eyes:     Pupils: Pupils are equal, round, and reactive to light.  Neck:     Musculoskeletal: Normal range of motion.  Cardiovascular:     Rate and Rhythm: Regular rhythm.     Heart sounds: Normal heart sounds.  Pulmonary:     Effort: Pulmonary effort is normal. No respiratory distress.     Breath sounds: Normal breath sounds.  Abdominal:     General: Bowel sounds are normal. There is no distension.     Palpations: Abdomen is soft.     Tenderness: There is no abdominal tenderness.  Musculoskeletal:        General: No tenderness or deformity.     Right lower leg: No edema.     Left lower leg: No edema.  Skin:    General: Skin is warm and dry.  Neurological:     Mental Status: She is alert and oriented to person, place, and time.      ED Treatments / Results  Labs (all labs ordered are listed, but only abnormal results are displayed) Labs Reviewed - No data to display  EKG None  Radiology No results found.  Procedures Procedures (including critical care time)  Medications Ordered in ED Medications  penicillin g benzathine (BICILLIN LA) 1200000 UNIT/2ML injection 1.2 Million Units (has no administration in time range)     Initial Impression / Assessment and Plan / ED Course  I have reviewed the triage vital signs and the nursing notes.  Pertinent labs & imaging results that were available during my care of the patient were reviewed by me and considered in my medical decision making (see chart for details).    Presents with sore throat x1 week reports started with a tickle in her throat and now has  continued to having with tolerating her own secretions.  She denies any fever but states she does have a 64-year-old at home who is been sick recently unsure of the illness.  She reports trying over-the-counter medication along with tea for relieving symptoms but states symptoms have not changed.  Evaluation there is tonsillar exudates on bilateral tonsils, no tonsillar abscess, significant lymphadenopathy on tonsillar region.  At this time will treat patient for strep pharyngitis as this is a patient is very high.  She states she does not have a cough but feels like she needs to clear her throat which  gives her some relief.  Is afebrile during visit, reports no other complaints.  Vitals stable for discharge.  Patient stable for discharge.  Final Clinical Impressions(s) / ED Diagnoses   Final diagnoses:  Strep pharyngitis    ED Discharge Orders    None       Janeece Fitting, PA-C 11/26/18 [facility], [place], DO 11/26/18 1636

## 2019-02-12 ENCOUNTER — Telehealth: Payer: Medicaid Other | Admitting: Nurse Practitioner

## 2019-02-12 DIAGNOSIS — R05 Cough: Secondary | ICD-10-CM

## 2019-02-12 DIAGNOSIS — R059 Cough, unspecified: Secondary | ICD-10-CM

## 2019-02-12 MED ORDER — PROMETHAZINE-DM 6.25-15 MG/5ML PO SYRP: 5.0000 mL | ORAL_SOLUTION | Freq: Four times a day (QID) | ORAL | 0 refills | Status: AC | PRN

## 2019-02-12 NOTE — Progress Notes (Signed)

## 2019-02-13 ENCOUNTER — Other Ambulatory Visit: Payer: Self-pay | Admitting: Nurse Practitioner

## 2019-02-13 NOTE — Progress Notes (Signed)
Patient called last evening and requested a follow-up phone call.  I spoke with the patient last night around 9 PM regarding her symptoms.  Patient informs that her symptoms are "weird".  Patient states she has a cough only in the mornings she has varying degrees of temperature meaning she has high at 1 minute and then cold the next, and has had ongoing loose stools or diarrhea over the last 3 to 4 days.  Patient also informs that she has to "make" herself eat as she is always feeling full.  At that time I instructed the patient to attempt the symptomatic treatment and advised her of how her viral syndrome works.  Informed the patient that I could not 100% tell her this is or is not COVID-19 at this time.  I instructed the patient to continue to self isolate to see if her symptoms improve.  Informed the patient that if she becomes unable to eat, or keep any foods or liquids down, I would recommend she follow-up in the ER.  Informed patient that as this is a viral syndrome, her symptoms could persist up to 14 days or longer.  Patient has not started any of the symptomatic treatment that was recommended at this time.

## 2019-02-19 ENCOUNTER — Telehealth: Payer: Medicaid Other | Admitting: Family

## 2019-02-19 DIAGNOSIS — M549 Dorsalgia, unspecified: Secondary | ICD-10-CM

## 2019-02-19 MED ORDER — NAPROXEN 500 MG PO TABS: 500.0000 mg | ORAL_TABLET | Freq: Two times a day (BID) | ORAL | 0 refills | Status: DC

## 2019-02-19 MED ORDER — CYCLOBENZAPRINE HCL 10 MG PO TABS: 10.0000 mg | ORAL_TABLET | Freq: Three times a day (TID) | ORAL | 0 refills | Status: DC | PRN

## 2019-02-19 NOTE — Progress Notes (Signed)

## 2019-04-26 ENCOUNTER — Telehealth: Payer: Medicaid Other | Admitting: Physician Assistant

## 2019-04-26 DIAGNOSIS — R109 Unspecified abdominal pain: Secondary | ICD-10-CM

## 2019-04-26 NOTE — Progress Notes (Signed)
Based on what you shared with me, I feel your condition warrants further evaluation and I recommend that you be seen for a face to face office visit.     NOTE: If you entered your credit card information for this eVisit, you will not be charged. You may see a "hold" on your card for the $35 but that hold will drop off and you will not have a charge processed.  If you are having a true medical emergency please call 911.  If you need an urgent face to face visit, [facility] has four urgent care centers for your convenience.    PLEASE NOTE: THE [facility] LOCATIONS AND URGENT CARE CLINICS DO NOT HAVE THE TESTING FOR CORONAVIRUS COVID19 AVAILABLE.  IF YOU FEEL YOU NEED THIS TEST YOU MUST GO TO A TRIAGE LOCATION AT [facility] ?  [email or web address] to reserve your spot online an avoid wait times  [facility] [address] [place], [place] [address] Modified hours of operation: Monday-Friday, 12 PM to 6 PM  Saturday & Sunday 10 AM to 4 PM *Across the street from [facility] (New Address!) [address] [place], [place] [address] *Just off [address], across the road from [place] hours of operation: Monday-Friday, 12 PM to 6 PM  Closed Saturday & Sunday  [facility]'s modified hours of operation will be in effect from May 1 until May 31   The following sites will take your insurance:  [facility] [place] a Provider at this Location  [place], [place] [address] 10 am to 8 pm Monday-Friday 12 pm to 8 pm [facility] [place] a Provider at this Location  [facility], [place],  [address] 8 am to 8 pm Monday-Friday 9 am to 6 pm Saturday 11 am to 6 pm Sunday   [facility]  at [facility]  [phone] Get Driving Directions  [address] [place], [place] [address] 8 am to 8 pm Monday-Friday 8 am to 4 pm Saturday-Sunday   Your e-visit answers were reviewed by a board certified advanced clinical practitioner to complete your personal care plan.  Thank you for using e-Visits.  ===View-only below this line===   ----- Message -----    From: Maria Sampson    Sent: 04/26/2019  1:48 PM EDT      To: E-Visit Mailing List Subject: E-Visit Submission: Vaginal Symptoms  E-Visit Submission: Vaginal Symptoms --------------------------------  Question: Which of the following are you experiencing? Answer:     Question: Are you having pain while passing urine? Answer:   No, I have no pain while urinating  Question: Which of the following applies to your vaginal discharge Answer:   I have no discharge  Question: Are you pregnant? Answer:   I am confident that I am not pregnant  Question: Are you breastfeeding? Answer:   No  Question: Which of the following are you experiencing? Answer:   Pain on bowl movement            Belly pain  Question: Do you have any sores on your genitals? Answer:   No  Question: Have you taken antibiotics recently? Answer:   I have not been on any antibiotics  Question: Do you do any of the following? Answer:   None  of the above  Question: Which of the following applies to your menstrual period? Answer:   I am having a menstrual period now  Question: Have you had similar symptoms in the past? Answer:   Yes, I have had similar symptoms more than once before  Question: When you had similar symptoms in  the past, did any of the following work? Answer:   Other  Question: Please specify what other treatments worked for you in the past Answer:   [facility] to emergency room they gave me medicine for pain and Nausea  Question: Do you have a fever? Answer:   Yes, I have a low fever (less than 101  degrees)  Question: How long have you had the fever? Answer:   For a few days  Question: During the past 2 months, have you had sexual contact with a specific person for the first time? Answer:   No  Question: Has a person with whom you have had sexual contact been recently told they have a disease possibly acquired through sex? Answer:   No  Question: Please list your medication allergies that you may have ? (If 'none' , please list as 'none') Answer:   None  Question: Please list any additional comments  Answer:   My period started a few days ago. I have been bleeding heavier and cramping really bad. And anytime I have a tampon in these effects get worst and I feel EXTREMELY Nauseated

## 2019-05-15 ENCOUNTER — Other Ambulatory Visit: Payer: Self-pay | Admitting: Family

## 2019-05-19 NOTE — Telephone Encounter (Signed)
DENIED.  PRESCRIBER NOT AT THIS PRACTICE.

## 2019-07-06 ENCOUNTER — Encounter (HOSPITAL_COMMUNITY): Payer: Self-pay

## 2019-07-06 ENCOUNTER — Other Ambulatory Visit: Payer: Self-pay

## 2019-07-06 ENCOUNTER — Emergency Department (HOSPITAL_COMMUNITY)
Admission: EM | Admit: 2019-07-06 | Discharge: 2019-07-06 | Disposition: A | Discharge status: Home / Self Care | Payer: Medicaid Other | Attending: Emergency Medicine | Admitting: Emergency Medicine

## 2019-07-06 DIAGNOSIS — F17290 Nicotine dependence, other tobacco product, uncomplicated: Secondary | ICD-10-CM | POA: Insufficient documentation

## 2019-07-06 DIAGNOSIS — Z91010 Allergy to peanuts: Secondary | ICD-10-CM | POA: Insufficient documentation

## 2019-07-06 DIAGNOSIS — Z79899 Other long term (current) drug therapy: Secondary | ICD-10-CM | POA: Insufficient documentation

## 2019-07-06 DIAGNOSIS — B349 Viral infection, unspecified: Secondary | ICD-10-CM | POA: Insufficient documentation

## 2019-07-06 LAB — CBC
HCT: 40.4 % (ref 36.0–46.0)
Hemoglobin: 12.9 g/dL (ref 12.0–15.0)
MCH: 29.5 pg (ref 26.0–34.0)
MCHC: 31.9 g/dL (ref 30.0–36.0)
MCV: 92.2 fL (ref 80.0–100.0)
Platelets: 272 10*3/uL (ref 150–400)
RBC: 4.38 MIL/uL (ref 3.87–5.11)
RDW: 13.5 % (ref 11.5–15.5)
WBC: 6.9 10*3/uL (ref 4.0–10.5)
nRBC: 0 % (ref 0.0–0.2)

## 2019-07-06 LAB — COMPREHENSIVE METABOLIC PANEL
ALT: 21 U/L (ref 0–44)
AST: 20 U/L (ref 15–41)
Albumin: 4.4 g/dL (ref 3.5–5.0)
Alkaline Phosphatase: 61 U/L (ref 38–126)
Anion gap: 8 (ref 5–15)
BUN: 13 mg/dL (ref 6–20)
CO2: 24 mmol/L (ref 22–32)
Calcium: 9.2 mg/dL (ref 8.9–10.3)
Chloride: 105 mmol/L (ref 98–111)
Creatinine, Ser: 0.8 mg/dL (ref 0.44–1.00)
GFR calc Af Amer: >60 mL/min (ref >60)
GFR calc non Af Amer: >60 mL/min (ref >60)
Glucose, Bld: 91 mg/dL (ref 70–99)
Potassium: 3.8 mmol/L (ref 3.5–5.1)
Sodium: 137 mmol/L (ref 135–145)
Total Bilirubin: 0.6 mg/dL (ref 0.3–1.2)
Total Protein: 7.2 g/dL (ref 6.5–8.1)

## 2019-07-06 LAB — URINALYSIS, ROUTINE W REFLEX MICROSCOPIC
Bilirubin Urine: NEGATIVE
Glucose, UA: NEGATIVE mg/dL
Hgb urine dipstick: NEGATIVE
Ketones, ur: NEGATIVE mg/dL
Leukocytes,Ua: NEGATIVE
Nitrite: NEGATIVE
Protein, ur: NEGATIVE mg/dL
Specific Gravity, Urine: 1.02 (ref 1.005–1.030)
pH: 5 (ref 5.0–8.0)

## 2019-07-06 LAB — PREGNANCY, URINE: Preg Test, Ur: NEGATIVE

## 2019-07-06 LAB — CBG MONITORING, ED: Glucose-Capillary: 86 mg/dL (ref 70–99)

## 2019-07-06 MED ORDER — KETOROLAC TROMETHAMINE 30 MG/ML IJ SOLN
30.0000 mg | Freq: Once | INTRAMUSCULAR | Status: AC
  Administered 2019-07-06: 15:00:00 30 mg via INTRAVENOUS
  Filled 2019-07-06: qty 1

## 2019-07-06 MED ORDER — SODIUM CHLORIDE 0.9 % IV BOLUS
1000.0000 mL | Freq: Once | INTRAVENOUS | Status: AC
  Administered 2019-07-06: 15:00:00 1000 mL via INTRAVENOUS

## 2019-07-06 NOTE — Discharge Instructions (Addendum)
Drink plenty of fluids and get plenty of rest.  Ibuprofen 600 mg every 6 hours as needed for pain.  Follow-up with your primary doctor if not improving in the next 3 to 4 days, and return to the ER if you develop worsening pain, difficulty breathing, high fevers, or other new and concerning symptoms.

## 2019-07-06 NOTE — ED Provider Notes (Signed)
[facility] DEPT Provider Note   CSN: [id number] Arrival date & time: 07/06/19  1217     History   Chief Complaint Chief Complaint  Patient presents with  . Generalized Body Aches    HPI Maria Sampson is a 28 y.o. female.     Patient is a 28 year old female with past medical history of depression, anemia, and uterine fibroids.  She presents today for evaluation of not feeling well for the past 10 days.  She describes muscle aches in her back and arms along with feeling hot, then cold, and pain in her throat and neck.  She states that she has felt dehydrated and has tried drinking increased amounts of fluids, however this has not helped.  She denies any dysuria.  She denies any diarrhea or constipation.  The history is provided by the patient.    Past Medical History:  Diagnosis Date  . Depression   . Otitis externa due to Pseudomonas aeruginosa 04/2014    Patient Active Problem List   Diagnosis Date Noted  . Constipation 02/26/2017  . MDD (major depressive disorder), recurrent severe, without psychosis ([place]) 02/26/2017  . Suicide attempt ([place]) 02/20/2017  . Suicide attempt by acetaminophen overdose ([place]) 02/20/2017  . Mild tetrahydrocannabinol (THC) abuse 02/20/2017  . BPV (benign positional vertigo) 10/27/2015  . Malnutrition of moderate degree 10/26/2015  . Abdominal pain 10/23/2015  . Nausea & vomiting 10/23/2015  . Sepsis ([place]) 10/23/2015  . Fibroid, uterine 10/23/2015  . Ileus ([place]) 10/23/2015  . Normocytic anemia 10/23/2015  . Overdose 06/03/2014  . Depressive disorder 06/03/2014  . Suicidal ideation 06/03/2014  . Otitis externa due to Pseudomonas aeruginosa 05/03/2014    Past Surgical History:  Procedure Laterality Date  . DENTAL EXAMINATION UNDER ANESTHESIA       OB History   No obstetric history on file.      Home Medications    Prior to Admission medications   Medication Sig Start Date End Date Taking?  Authorizing Provider  cyclobenzaprine (FLEXERIL) 10 MG tablet Take 1 tablet (10 mg total) by mouth 3 (three) times daily as needed for muscle spasms. 02/19/19   Kennyth Arnold, FNP  naproxen (NAPROSYN) 500 MG tablet Take 1 tablet (500 mg total) by mouth 2 (two) times daily with a meal. 02/19/19   Dutch Quint B, FNP  ondansetron (ZOFRAN) 4 MG tablet Take 1 tablet (4 mg total) by mouth every 6 (six) hours. Patient not taking: Reported on 11/26/2018 10/30/18   Nat Christen, MD  oxyCODONE-acetaminophen (PERCOCET/ROXICET) 5-325 MG tablet Take 1 tablet by mouth every 4 (four) hours as needed for severe pain. Patient not taking: Reported on 11/26/2018 10/30/18   Nat Christen, MD  predniSONE (DELTASONE) 20 MG tablet 3 tabs po daily x 3 days, then 2 tabs x 3 days, then 1.5 tabs x 3 days, then 1 tab x 3 days, then 0.5 tabs x 3 days Patient not taking: Reported on 10/30/2018 06/25/18   Orpah Greek, MD  traMADol (ULTRAM) 50 MG tablet Take 1 tablet (50 mg total) by mouth every 6 (six) hours as needed. Patient not taking: Reported on 10/30/2018 06/25/18   Orpah Greek, MD    Family History Family History  Problem Relation Age of Onset  . Crohn's disease Other     Social History Social History   Tobacco Use  . Smoking status: Current Every Day Smoker    Types: Cigars  . Smokeless tobacco: Never Used  . Tobacco  comment: 2 per day  Substance Use Topics  . Alcohol use: Yes    Comment: Social  . Drug use: Yes    Types: Marijuana     Allergies   Peanut-containing drug products   Review of Systems Review of Systems  All other systems reviewed and are negative.    Physical Exam Updated Vital Signs BP (!) 148/92   Pulse 65   Temp 98.3 F (36.8 C) (Oral)   Resp 16   Ht 5\' 10"  (1.778 m)   Wt 112.9 kg   LMP 06/22/2019   SpO2 100%   BMI 35.73 kg/m   Physical Exam Vitals signs and nursing note reviewed.  Constitutional:      General: She is not in acute distress.     Appearance: She is well-developed. She is not diaphoretic.  HENT:     Head: Normocephalic and atraumatic.     Mouth/Throat:     Mouth: Mucous membranes are moist.     Pharynx: No oropharyngeal exudate or posterior oropharyngeal erythema.  Neck:     Musculoskeletal: Normal range of motion and neck supple.  Cardiovascular:     Rate and Rhythm: Normal rate and regular rhythm.     Heart sounds: No murmur. No friction rub. No gallop.   Pulmonary:     Effort: Pulmonary effort is normal. No respiratory distress.     Breath sounds: Normal breath sounds. No wheezing.  Abdominal:     General: Bowel sounds are normal. There is no distension.     Palpations: Abdomen is soft.     Tenderness: There is no abdominal tenderness.  Musculoskeletal: Normal range of motion.        General: No swelling or tenderness.     Right lower leg: No edema.     Left lower leg: No edema.  Skin:    General: Skin is warm and dry.  Neurological:     General: No focal deficit present.     Mental Status: She is alert and oriented to person, place, and time.      ED Treatments / Results  Labs (all labs ordered are listed, but only abnormal results are displayed) Labs Reviewed  CBC  COMPREHENSIVE METABOLIC PANEL  URINALYSIS, ROUTINE W REFLEX MICROSCOPIC  PREGNANCY, URINE  CBG MONITORING, ED    EKG None  Radiology No results found.  Procedures Procedures (including critical care time)  Medications Ordered in ED Medications  sodium chloride 0.9 % bolus 1,000 mL (has no administration in time range)  ketorolac (TORADOL) 30 MG/ML injection 30 mg (has no administration in time range)     Initial Impression / Assessment and Plan / ED Course  I have reviewed the triage vital signs and the nursing notes.  Pertinent labs & imaging results that were available during my care of the patient were reviewed by me and considered in my medical decision making (see chart for details).  Patient presenting with  complaints of spasms in her muscles, feeling hot, then cold, and possible kidney pain.  Her work-up today reveals no evidence for UTI.  Her pregnancy test is negative and electrolytes and blood counts are all normal.  I am uncertain as to the exact etiology of this patient's discomfort and symptoms, however nothing today appears emergent.  She was given IV fluids and I believe is appropriate for discharge.  I will advise her to take ibuprofen as needed for pain and follow-up if not improving/return if worsens.  Final Clinical Impressions(s) /  ED Diagnoses   Final diagnoses:  None    ED Discharge Orders    None       Veryl Speak, MD 07/06/19 1620

## 2019-07-06 NOTE — ED Triage Notes (Signed)
Pt states body aches x 1 week. Pt states muscle spasms, then fluctuating between hot and cold. Pt c/o pain in her back near the kidneys. Pt c/o neck pain and throat pain as well. Pt states that she has been drinking lots of water but still feels dehydrated

## 2019-08-07 ENCOUNTER — Ambulatory Visit (HOSPITAL_COMMUNITY)
Admission: RE | Admit: 2019-08-07 | Discharge: 2019-08-07 | Disposition: A | Discharge status: Home / Self Care | Payer: Self-pay | Attending: Psychiatry | Admitting: Psychiatry

## 2019-08-07 DIAGNOSIS — F329 Major depressive disorder, single episode, unspecified: Secondary | ICD-10-CM | POA: Insufficient documentation

## 2019-08-07 DIAGNOSIS — F419 Anxiety disorder, unspecified: Secondary | ICD-10-CM | POA: Insufficient documentation

## 2019-08-07 NOTE — BH Assessment (Signed)
Assessment Note  Maria Sampson is an 28 y.o. female presenting voluntarily unaccompanied to [facility] for assessment. Patient reports she has experienced depression since age 57 but symptoms are worsening. She endorses symptoms of hopelessness, irritability, anhedonia, social isolation, worthlessness, fatigue, poor appetite, and insomnia. She denies SI/HI/AVH. Patient states she smokes THC 4-5 times daily to cope. She denies any other substance use. Patient identifies being laid off from her day job due to coronavirus and family conflict as her primary stressors. She denies criminal charges or access to weapons.  Diagnosis: F33.2 MDD, recurrent, severe   F12.20 Cannabis use disorder, severe  Past Medical History:  Past Medical History:  Diagnosis Date  . Depression   . Otitis externa due to Pseudomonas aeruginosa 04/2014    Past Surgical History:  Procedure Laterality Date  . DENTAL EXAMINATION UNDER ANESTHESIA      Family History:  Family History  Problem Relation Age of Onset  . Crohn's disease Other     Social History:  reports that she has been smoking cigars. She has never used smokeless tobacco. She reports current alcohol use. She reports current drug use. Drug: Marijuana.  Additional Social History:  Alcohol / Drug Use Pain Medications: see MAR Prescriptions: see MAR Over the Counter: see MAR History of alcohol / drug use?: Yes Substance #1 Name of Substance 1: THC 1 - Age of First Use: UTA 1 - Amount (size/oz): 4-5 times 1 - Frequency: daily 1 - Duration: UTA 1 - Last Use / Amount: 08/06/2019  CIWA:   COWS:    Allergies:  Allergies  Allergen Reactions  . Peanut-Containing Drug Products Anaphylaxis and Hives    Home Medications: (Not in a hospital admission)   OB/GYN Status:  No LMP recorded.  General Assessment Data Location of Assessment: [facility] Assessment Services TTS Assessment: In system Is this a Tele or Face-to-Face Assessment?: Face-to-Face Is this an  Initial Assessment or a Re-assessment for this encounter?: Initial Assessment Patient Accompanied by:: N/A Language Other than English: No Living Arrangements: (her home) What gender do you identify as?: Female Marital status: Long term relationship Maiden name: Jefferies Pregnancy Status: No Living Arrangements: Spouse/significant other, Children Can pt return to current living arrangement?: Yes Admission Status: Voluntary Is patient capable of signing voluntary admission?: Yes Referral Source: Self/Family/Friend Insurance type: none     Crisis Care Plan Living Arrangements: Spouse/significant other, Children Legal Guardian: (self) Name of Psychiatrist: none Name of Therapist: none  Education Status Is patient currently in school?: No Is the patient employed, unemployed or receiving disability?: Employed  Risk to self with the past 6 months Suicidal Ideation: No Has patient been a risk to self within the past 6 months prior to admission? : No Suicidal Intent: No Has patient had any suicidal intent within the past 6 months prior to admission? : No Is patient at risk for suicide?: No Suicidal Plan?: No Has patient had any suicidal plan within the past 6 months prior to admission? : No Access to Means: No What has been your use of drugs/alcohol within the last 12 months?: daily THC Previous Attempts/Gestures: Yes How many times?: 1 Other Self Harm Risks: none noted Triggers for Past Attempts: None known Intentional Self Injurious Behavior: None Family Suicide History: No Recent stressful life event(s): Financial Problems Persecutory voices/beliefs?: No Depression: Yes Depression Symptoms: Despondent, Insomnia, Tearfulness, Isolating, Fatigue, Guilt, Loss of interest in usual pleasures, Feeling angry/irritable, Feeling worthless/self pity Substance abuse history and/or treatment for substance abuse?: No Suicide prevention  information given to non-admitted patients: Not  applicable  Risk to Others within the past 6 months Homicidal Ideation: No Does patient have any lifetime risk of violence toward others beyond the six months prior to admission? : No Thoughts of Harm to Others: No Current Homicidal Intent: No Current Homicidal Plan: No Access to Homicidal Means: No Identified Victim: none History of harm to others?: No Assessment of Violence: None Noted Violent Behavior Description: none noted Does patient have access to weapons?: No Criminal Charges Pending?: No Does patient have a court date: No Is patient on probation?: No  Psychosis Hallucinations: None noted Delusions: None noted  Mental Status Report Appearance/Hygiene: Unremarkable Eye Contact: Good Motor Activity: Freedom of movement Speech: Logical/coherent Level of Consciousness: Crying, Alert Mood: Depressed Affect: Depressed Anxiety Level: Moderate Thought Processes: Coherent, Relevant Judgement: Partial Orientation: Person, Place, Time, Situation Obsessive Compulsive Thoughts/Behaviors: None  Cognitive Functioning Concentration: Normal Memory: Recent Intact, Remote Intact Is patient IDD: No Insight: Fair Impulse Control: Fair Appetite: Poor Have you had any weight changes? : No Change Sleep: Decreased Total Hours of Sleep: 2 Vegetative Symptoms: Staying in bed  ADLScreening [facility] Assessment Services) Patient's cognitive ability adequate to safely complete daily activities?: Yes Patient able to express need for assistance with ADLs?: Yes Independently performs ADLs?: Yes (appropriate for developmental age)  Prior Inpatient Therapy Prior Inpatient Therapy: Yes Prior Therapy Dates: 2016 Prior Therapy Facilty/Provider(s): [facility] Reason for Treatment: depression  Prior Outpatient Therapy Prior Outpatient Therapy: No Does patient have an ACCT team?: No Does patient have Intensive In-House Services?  : No Does patient have [facility] services? : No Does patient  have P4CC services?: No  ADL Screening (condition at time of admission) Patient's cognitive ability adequate to safely complete daily activities?: Yes Is the patient deaf or have difficulty hearing?: No Does the patient have difficulty seeing, even when wearing glasses/contacts?: No Does the patient have difficulty concentrating, remembering, or making decisions?: No Patient able to express need for assistance with ADLs?: Yes Does the patient have difficulty dressing or bathing?: No Independently performs ADLs?: Yes (appropriate for developmental age) Does the patient have difficulty walking or climbing stairs?: No Weakness of Legs: None Weakness of Arms/Hands: None  Home Assistive Devices/Equipment Home Assistive Devices/Equipment: None  Therapy Consults (therapy consults require a physician order) PT Evaluation Needed: No OT Evalulation Needed: No SLP Evaluation Needed: No Abuse/Neglect Assessment (Assessment to be complete while patient is alone) Abuse/Neglect Assessment Can Be Completed: Yes Physical Abuse: Denies Verbal Abuse: Yes, past (Comment)(growing up) Sexual Abuse: Denies Exploitation of patient/patient's resources: Denies Self-Neglect: Denies Values / Beliefs Cultural Requests During Hospitalization: None Spiritual Requests During Hospitalization: None Consults Spiritual Care Consult Needed: No Social Work Consult Needed: No Regulatory affairs officer (For Healthcare) Does Patient Have a Medical Advance Directive?: No Would patient like information on creating a medical advance directive?: No - Patient declined          Disposition: Per Priscille Loveless, PMHNP patient does not meet in patient criteria. Patient discharged with  OPT resources. Disposition Initial Assessment Completed for this Encounter: Yes Disposition of Patient: Discharge Patient refused recommended treatment: No Mode of transportation if patient is discharged/movement?: Car Patient referred to:  Outpatient clinic referral  On Site Evaluation by:   Reviewed with Physician:    Orvis Brill 08/07/2019 1:30 PM

## 2019-08-09 NOTE — H&P (Signed)
Behavioral Health Medical Screening Exam  Maria Sampson is an 28 y.o. female with anxiety and worsenign depression. She endorses ongoing problems of hopelessness, irritability, anhedonia, and social isolation. She endorses some passive suicidal ideation, however is able to contract for safety. She is future oriented in that she is working , and does not want to miss out on work. She understand that inpatient admission will decrease her income, which is one of her stressors. She reports having a partner and a 17 year old child she has to take care of, and needs to go to work. She reports not going to work will make her anxiety worse. She denies any si/hi/avh at this time.   Total Time spent with patient: 30 minutes  Psychiatric Specialty Exam: Physical Exam  ROS  There were no vitals taken for this visit.There is no height or weight on file to calculate BMI.  General Appearance: Fairly Groomed  Eye Contact:  Fair  Speech:  Clear and Coherent and Normal Rate  Volume:  Normal  Mood:  Anxious and Depressed  Affect:  Depressed  Thought Process:  Coherent, Goal Directed and Descriptions of Associations: Intact  Orientation:  Full (Time, Place, and Person)  Thought Content:  WDL  Suicidal Thoughts:  No  Homicidal Thoughts:  No  Memory:  Immediate;   Fair Recent;   Fair  Judgement:  Fair  Insight:  Fair  Psychomotor Activity:  Normal  Concentration: Concentration: Fair and Attention Span: Fair  Recall:  [place]: Fair  Akathisia:  No  Handed:  Right  AIMS (if indicated):     Assets:  Communication Skills Desire for Improvement Financial Resources/Insurance Housing Intimacy Leisure Time [place] Talents/Skills Transportation Vocational/Educational  Sleep:       Musculoskeletal: Strength & Muscle Tone: within normal limits Gait & Station: normal Patient leans: N/A  There were no vitals taken for this  visit.  Recommendations:  Based on my evaluation the patient does not appear to have an emergency medical condition. Will discharge home with outpatient resources. She is given information about IOP/PHP both of which she could benefit from.   Suella Broad, FNP 08/09/2019, 4:31 PM

## 2019-08-31 ENCOUNTER — Telehealth: Payer: Medicaid Other | Admitting: Physician Assistant

## 2019-08-31 DIAGNOSIS — M436 Torticollis: Secondary | ICD-10-CM

## 2019-08-31 DIAGNOSIS — R52 Pain, unspecified: Secondary | ICD-10-CM

## 2019-08-31 NOTE — Progress Notes (Signed)
Good morning Maria Sampson,  I am sorry you are not feeling well.  I am concerned about some of the symptoms you describe in the comment section.  I do not feel comfortable treating you through e-visit and would prefer to have a medical provider see you in person.  I would hate to miss a more severe infection.  Thank you for your understanding and I hope you feel better soon.    Based on what you shared with me, I feel your condition warrants further evaluation and I recommend that you be seen for a face to face office visit.  NOTE: If you entered your credit card information for this eVisit, you will not be charged. You may see a "hold" on your card for the $35 but that hold will drop off and you will not have a charge processed.  If you are having a true medical emergency please call 911.     For an urgent face to face visit, [facility] has four urgent care centers for your convenience:   . [facility]    [phone]                  Get Driving Directions  [id number] [place], [place] [address] . 10 am to 8 pm Monday-Friday . 12 pm to 8 pm Saturday-Sunday   . [facility] at [place]                  Get Driving Directions  [id number] [place], [place], [place] [address] . 8 am to 8 pm Monday-Friday . 9 am to 6 pm Saturday . 11 am to 6 pm Sunday   . [facility] at [place]                  Get Driving Directions   [address] [place], [place] [address] . 8 am to 8 pm Monday-Friday . 8 am to 4 pm Saturday-Sunday    . [facility] at [place]                    Get Driving Directions  [id number]  [address], [place],  [address]  . Monday-Friday, 12 PM to 6 PM    Your e-visit answers were reviewed by a board certified advanced clinical practitioner to complete your personal care plan.  Thank you for using e-Visits.

## 2019-10-12 ENCOUNTER — Emergency Department (HOSPITAL_COMMUNITY): Payer: Self-pay

## 2019-10-12 ENCOUNTER — Emergency Department (HOSPITAL_COMMUNITY)
Admission: EM | Admit: 2019-10-12 | Discharge: 2019-10-12 | Disposition: A | Discharge status: Home / Self Care | Payer: Self-pay | Attending: Emergency Medicine | Admitting: Emergency Medicine

## 2019-10-12 ENCOUNTER — Other Ambulatory Visit: Payer: Self-pay

## 2019-10-12 ENCOUNTER — Encounter (HOSPITAL_COMMUNITY): Payer: Self-pay

## 2019-10-12 DIAGNOSIS — R531 Weakness: Secondary | ICD-10-CM | POA: Insufficient documentation

## 2019-10-12 DIAGNOSIS — M549 Dorsalgia, unspecified: Secondary | ICD-10-CM | POA: Insufficient documentation

## 2019-10-12 DIAGNOSIS — R109 Unspecified abdominal pain: Secondary | ICD-10-CM | POA: Insufficient documentation

## 2019-10-12 DIAGNOSIS — F17290 Nicotine dependence, other tobacco product, uncomplicated: Secondary | ICD-10-CM | POA: Insufficient documentation

## 2019-10-12 DIAGNOSIS — K5900 Constipation, unspecified: Secondary | ICD-10-CM | POA: Insufficient documentation

## 2019-10-12 DIAGNOSIS — Z91010 Allergy to peanuts: Secondary | ICD-10-CM | POA: Insufficient documentation

## 2019-10-12 DIAGNOSIS — R34 Anuria and oliguria: Secondary | ICD-10-CM | POA: Insufficient documentation

## 2019-10-12 DIAGNOSIS — M79605 Pain in left leg: Secondary | ICD-10-CM | POA: Insufficient documentation

## 2019-10-12 DIAGNOSIS — R630 Anorexia: Secondary | ICD-10-CM | POA: Insufficient documentation

## 2019-10-12 DIAGNOSIS — K920 Hematemesis: Secondary | ICD-10-CM | POA: Insufficient documentation

## 2019-10-12 DIAGNOSIS — M79604 Pain in right leg: Secondary | ICD-10-CM | POA: Insufficient documentation

## 2019-10-12 DIAGNOSIS — R638 Other symptoms and signs concerning food and fluid intake: Secondary | ICD-10-CM | POA: Insufficient documentation

## 2019-10-12 LAB — COMPREHENSIVE METABOLIC PANEL
ALT: 15 U/L (ref 0–44)
AST: 16 U/L (ref 15–41)
Albumin: 4.6 g/dL (ref 3.5–5.0)
Alkaline Phosphatase: 67 U/L (ref 38–126)
Anion gap: 8 (ref 5–15)
BUN: 11 mg/dL (ref 6–20)
CO2: 22 mmol/L (ref 22–32)
Calcium: 9.4 mg/dL (ref 8.9–10.3)
Chloride: 108 mmol/L (ref 98–111)
Creatinine, Ser: 0.95 mg/dL (ref 0.44–1.00)
GFR calc Af Amer: >60 mL/min (ref >60)
GFR calc non Af Amer: >60 mL/min (ref >60)
Glucose, Bld: 104 mg/dL — ABNORMAL HIGH (ref 70–99)
Potassium: 3.5 mmol/L (ref 3.5–5.1)
Sodium: 138 mmol/L (ref 135–145)
Total Bilirubin: 0.8 mg/dL (ref 0.3–1.2)
Total Protein: 7.6 g/dL (ref 6.5–8.1)

## 2019-10-12 LAB — CBC
HCT: 41.8 % (ref 36.0–46.0)
Hemoglobin: 13.9 g/dL (ref 12.0–15.0)
MCH: 30.7 pg (ref 26.0–34.0)
MCHC: 33.3 g/dL (ref 30.0–36.0)
MCV: 92.3 fL (ref 80.0–100.0)
Platelets: 308 10*3/uL (ref 150–400)
RBC: 4.53 MIL/uL (ref 3.87–5.11)
RDW: 13 % (ref 11.5–15.5)
WBC: 8.2 10*3/uL (ref 4.0–10.5)
nRBC: 0 % (ref 0.0–0.2)

## 2019-10-12 LAB — LIPASE, BLOOD: Lipase: 24 U/L (ref 11–51)

## 2019-10-12 LAB — I-STAT BETA HCG BLOOD, ED (MC, WL, AP ONLY): I-stat hCG, quantitative: <5 m[IU]/mL (ref <5)

## 2019-10-12 LAB — TSH: TSH: 0.41 u[IU]/mL (ref 0.350–4.500)

## 2019-10-12 MED ORDER — POLYETHYLENE GLYCOL 3350 17 GM/SCOOP PO POWD: 1.0000 | Freq: Once | ORAL | 0 refills | Status: AC

## 2019-10-12 MED ORDER — ONDANSETRON HCL 4 MG/2ML IJ SOLN
4.0000 mg | Freq: Once | INTRAMUSCULAR | Status: AC
  Administered 2019-10-12: 4 mg via INTRAVENOUS
  Filled 2019-10-12: qty 2

## 2019-10-12 MED ORDER — SODIUM CHLORIDE 0.9% FLUSH: 3.0000 mL | Freq: Once | INTRAVENOUS | Status: DC

## 2019-10-12 MED ORDER — ONDANSETRON 8 MG PO TBDP: 8.0000 mg | ORAL_TABLET | Freq: Three times a day (TID) | ORAL | 0 refills | Status: DC | PRN

## 2019-10-12 NOTE — Discharge Instructions (Addendum)
We saw you in the ER for nausea, abdominal pain, loss of appetite. All the results in the ER fortunately are normal, labs and imaging. We are not sure what is causing your symptoms. The workup in the ER is not complete, and is limited to screening for life threatening and emergent conditions only, so please see a primary care doctor for further evaluation.  Please return to the ER if your symptoms worsen; you have increased pain, fevers, chills, inability to keep any medications down, confusion. Otherwise see the outpatient doctor as requested.

## 2019-10-12 NOTE — ED Notes (Signed)
Bladder scan revealed 40ml fluid in bladder.

## 2019-10-12 NOTE — ED Triage Notes (Signed)
Patient states that she has has not been drinking or eating due to nausea x 2-3 weeks.  Patient states that she vomited this AM and states she had blood in her emesis today. Patient states she has gone from eating only one meal a day to eating no meals x 3 days. patient states she has not been drinking water either.  Patient c/o right abdominal pain, mid back pain and bilateral leg pain. Patient also c/o no BM x 3 days.

## 2019-10-12 NOTE — ED Notes (Signed)
Pt notified UA sample needed and she informed me that she has not urinated since yesterday.

## 2019-10-12 NOTE — ED Notes (Signed)
Pt complaining of nausea after eating crackers. MD and RN aware.

## 2019-10-12 NOTE — ED Triage Notes (Signed)
The patient is from home. She complains of decreased appetite for 2 weeks and has not eaten anything since Friday. Today she had 6 episodes of emesis. She also complains of generalized pain for 1 week. Tylenol PM has not helped.   EMS vitals: 136/104 BP 86 HR 97.5 Temp 100 RA

## 2019-10-12 NOTE — ED Provider Notes (Signed)
[facility] DEPT Provider Note   CSN: [id number] Arrival date & time: 10/12/19  1513     History   Chief Complaint Chief Complaint  Patient presents with  . multiple complaints  . Hematemesis    HPI Maria Sampson is a 28 y.o. female.     HPI 28 year old female comes in a chief complaint of vomiting, nausea, generalized weakness, loss of appetite, constipation.  She has been feeling unwell for the last several weeks.  She has not had a bowel movement in over 4 days now.  She does not think she is passing flatus.  Additionally she is also reporting that she has no appetite and has had only one meal in the last 3 days and she is rarely drinking water.  She has no thirst.  She has nonspecific abdominal pain.  She denies any major pelvic issues or GI disease.  She does report family history of Crohn's.  Review of system is negative for body aches, rash, weight loss.  Past Medical History:  Diagnosis Date  . Depression   . Otitis externa due to Pseudomonas aeruginosa 04/2014    Patient Active Problem List   Diagnosis Date Noted  . Constipation 02/26/2017  . MDD (major depressive disorder), recurrent severe, without psychosis ([place]) 02/26/2017  . Suicide attempt ([place]) 02/20/2017  . Suicide attempt by acetaminophen overdose ([place]) 02/20/2017  . Mild tetrahydrocannabinol (THC) abuse 02/20/2017  . BPV (benign positional vertigo) 10/27/2015  . Malnutrition of moderate degree 10/26/2015  . Abdominal pain 10/23/2015  . Nausea & vomiting 10/23/2015  . Sepsis ([place]) 10/23/2015  . Fibroid, uterine 10/23/2015  . Ileus ([place]) 10/23/2015  . Normocytic anemia 10/23/2015  . Overdose 06/03/2014  . Depressive disorder 06/03/2014  . Suicidal ideation 06/03/2014  . Otitis externa due to Pseudomonas aeruginosa 05/03/2014    Past Surgical History:  Procedure Laterality Date  . DENTAL EXAMINATION UNDER ANESTHESIA       OB History   No obstetric history on  file.      Home Medications    Prior to Admission medications   Medication Sig Start Date End Date Taking? Authorizing Provider  diphenhydramine-acetaminophen (TYLENOL PM) 25-500 MG TABS tablet Take 1 tablet by mouth at bedtime as needed.   Yes [provider]  cyclobenzaprine (FLEXERIL) 10 MG tablet Take 1 tablet (10 mg total) by mouth 3 (three) times daily as needed for muscle spasms. Patient not taking: Reported on 10/12/2019 02/19/19   Kennyth Arnold, FNP  naproxen (NAPROSYN) 500 MG tablet Take 1 tablet (500 mg total) by mouth 2 (two) times daily with a meal. Patient not taking: Reported on 07/06/2019 02/19/19   Kennyth Arnold, FNP  ondansetron (ZOFRAN ODT) 8 MG disintegrating tablet Take 1 tablet (8 mg total) by mouth every 8 (eight) hours as needed for nausea. 10/12/19   Varney Biles, MD  ondansetron (ZOFRAN) 4 MG tablet Take 1 tablet (4 mg total) by mouth every 6 (six) hours. Patient not taking: Reported on 11/26/2018 10/30/18   Nat Christen, MD  oxyCODONE-acetaminophen (PERCOCET/ROXICET) 5-325 MG tablet Take 1 tablet by mouth every 4 (four) hours as needed for severe pain. Patient not taking: Reported on 11/26/2018 10/30/18   Nat Christen, MD  polyethylene glycol powder [facility]) 17 GM/SCOOP powder Take 255 g by mouth once for 1 dose. 10/12/19 10/12/19  Varney Biles, MD  predniSONE (DELTASONE) 20 MG tablet 3 tabs po daily x 3 days, then 2 tabs x 3 days, then 1.5 tabs  x 3 days, then 1 tab x 3 days, then 0.5 tabs x 3 days Patient not taking: Reported on 10/30/2018 06/25/18   Orpah Greek, MD  traMADol (ULTRAM) 50 MG tablet Take 1 tablet (50 mg total) by mouth every 6 (six) hours as needed. Patient not taking: Reported on 10/30/2018 06/25/18   Orpah Greek, MD    Family History Family History  Problem Relation Age of Onset  . Crohn's disease Other   . Crohn's disease Mother     Social History Social History   Tobacco Use  . Smoking status:  Current Every Day Smoker    Types: Cigars  . Smokeless tobacco: Never Used  . Tobacco comment: 2 per day  Substance Use Topics  . Alcohol use: Yes    Comment: Social  . Drug use: Yes    Types: Marijuana     Allergies   Peanut-containing drug products   Review of Systems Review of Systems  Constitutional: Positive for activity change.  Respiratory: Negative for shortness of breath.   Cardiovascular: Negative for chest pain.  Gastrointestinal: Positive for nausea and vomiting.  Allergic/Immunologic: Negative for immunocompromised state.  Hematological: Does not bruise/bleed easily.  All other systems reviewed and are negative.    Physical Exam Updated Vital Signs BP (!) 140/92   Pulse (!) 58   Temp 98.3 F (36.8 C) (Oral)   Resp 18   Ht 5\' 10"  (1.778 m)   Wt 88.5 kg   LMP 09/23/2019 (Approximate)   SpO2 100%   BMI 27.98 kg/m   Physical Exam Vitals signs and nursing note reviewed.  Constitutional:      Appearance: She is well-developed.  HENT:     Head: Normocephalic and atraumatic.  Neck:     Musculoskeletal: Normal range of motion and neck supple.  Cardiovascular:     Rate and Rhythm: Normal rate.  Pulmonary:     Effort: Pulmonary effort is normal.  Abdominal:     General: Bowel sounds are normal.  Skin:    General: Skin is warm and dry.  Neurological:     Mental Status: She is alert and oriented to person, place, and time.      ED Treatments / Results  Labs (all labs ordered are listed, but only abnormal results are displayed) Labs Reviewed  COMPREHENSIVE METABOLIC PANEL - Abnormal; Notable for the following components:      Result Value   Glucose, Bld 104 (*)    All other components within normal limits  LIPASE, BLOOD  CBC  TSH  URINALYSIS, ROUTINE W REFLEX MICROSCOPIC  I-STAT BETA HCG BLOOD, ED ([facility], [facility], [facility] ONLY)    EKG None  Radiology Dg Abd Acute W/chest  Result Date: 10/12/2019 CLINICAL DATA:  Decreased appetite EXAM: DG  ABDOMEN ACUTE W/ 1V CHEST COMPARISON:  02/22/2017, 11/08/2013 FINDINGS: Single-view chest demonstrates hazy right infrahilar opacity. No pleural effusion. Normal heart size. Supine and upright views of the abdomen demonstrate a nonobstructed gas pattern. Probable phleboliths in the pelvis IMPRESSION: 1. Nonobstructed gas pattern 2. Right hazy infrahilar opacity, atelectasis versus mild pneumonia Electronically Signed   By: Donavan Foil M.D.   On: 10/12/2019 20:37    Procedures Procedures (including critical care time)  Medications Ordered in ED Medications  sodium chloride flush (NS) 0.9 % injection 3 mL (has no administration in time range)  ondansetron (ZOFRAN) injection 4 mg (4 mg Intravenous Given 10/12/19 2033)     Initial Impression / Assessment and Plan / ED  Course  I have reviewed the triage vital signs and the nursing notes.  Pertinent labs & imaging results that were available during my care of the patient were reviewed by me and considered in my medical decision making (see chart for details).        Maria Sampson was evaluated in Emergency Department on 10/12/2019 for the symptoms described in the history of present illness. She was evaluated in the context of the global COVID-19 pandemic, which necessitated consideration that the patient might be at risk for infection with the SARS-CoV-2 virus that causes COVID-19. Institutional protocols and algorithms that pertain to the evaluation of patients at risk for COVID-19 are in a state of rapid change based on information released by regulatory bodies including the CDC and federal and state organizations. These policies and algorithms were followed during the patient's care in the ED.   28 year old comes in with multiple complaints.  It seems like they are unrelated.  She has some family history of Crohn's disease therefore this could be early findings of inflammatory bowel disease.  However review of system is not really  concerning for anything else besides nausea, anorexia and generalized weakness.  We ordered labs which are reassuring. She reports that she has not urinated in several days but her bladder scan is normal. Labs are reassuring not revealing any evidence of organ dysfunction, electrolyte abnormalities.  Symptoms have been present for several days now therefore I doubt this is COVID-19.  She has anorexia but she can still taste food.  Stable for discharge at this time.  Final Clinical Impressions(s) / ED Diagnoses   Final diagnoses:  Loss of appetite  Constipation, unspecified constipation type    ED Discharge Orders         Ordered    ondansetron (ZOFRAN ODT) 8 MG disintegrating tablet  Every 8 hours PRN     10/12/19 2107    polyethylene glycol powder (GLYCOLAX/MIRALAX) 17 GM/SCOOP powder   Once     10/12/19 2108           Varney Biles, MD 10/12/19 2332

## 2019-10-12 NOTE — ED Notes (Signed)
Pt sipped on some juice, pt states "my stomach is bubbling or gurgling" but no complaints of N/V.

## 2019-10-13 NOTE — Progress Notes (Signed)
Greater than 5 minutes, yet less than 10 minutes of time have been spent researching, coordinating, and implementing care for this patient today.  Thank you for the details you included in the comment boxes. Those details are very helpful in determining the best course of treatment for you and help us to provide the best care.  

## 2019-10-25 ENCOUNTER — Telehealth: Payer: Medicaid Other | Admitting: Family

## 2019-10-25 DIAGNOSIS — Z20828 Contact with and (suspected) exposure to other viral communicable diseases: Secondary | ICD-10-CM

## 2019-10-25 DIAGNOSIS — Z20822 Contact with and (suspected) exposure to covid-19: Secondary | ICD-10-CM

## 2019-10-25 NOTE — Progress Notes (Signed)
E-Visit for Corona Virus Screening   Your current symptoms could be consistent with the coronavirus.  Many health care providers can now test patients at their office but not all are.  [facility] has multiple testing sites. For information on our COVID testing locations and hours go to [email or web address]  Please quarantine yourself while awaiting your test results.   Please continue good preventive care measures, including:  frequent hand-washing, avoid touching your face, cover coughs/sneezes, stay out of crowds and keep a 6 foot distance from others.   You can go to one of the testing sites listed below, while they are opened (see hours). You do not need an order and will stay in your car during the test. You do need to self isolate until your results return and if positive 14 days from when your symptoms started and until you are 3 days fever free.   Testing Locations (Monday - Friday, 10 a.m. - 3 p.m.) . [place]: [facility], [address], [place], [place]: [place], [place], [place], [place] (entrance off [address])  . [facility]: (Closed each Monday): Testing site relocated to the short stay covered drive at [facility]. (Use the [facility] entrance to [facility] next to [place] is a respiratory illness with symptoms that are similar to the flu. Symptoms are typically mild to moderate, but there have been cases of severe illness and death due to the virus. The following symptoms may appear 2-14 days after exposure: . Fever . Cough . Shortness of breath or difficulty breathing . Chills . Repeated shaking with chills . Muscle pain . Headache . Sore throat . New loss of taste or smell . Fatigue . Congestion or runny nose . Nausea or vomiting . Diarrhea  If you develop fever/cough/breathlessness, please stay home for  10 days with improving symptoms and until you have had 24 hours of no fever (without taking a fever reducer).  Go to the nearest hospital ED for assessment if fever/cough/breathlessness are severe or illness seems like a threat to life.  It is vitally important that if you feel that you have an infection such as this virus or any other virus that you stay home and away from places where you may spread it to others.  You should avoid contact with people age 1 and older.   You should wear a mask or cloth face covering over your nose and mouth if you must be around other people or animals, including pets (even at home). Try to stay at least 6 feet away from other people. This will protect the people around you.   You may also take acetaminophen (Tylenol) as needed for fever.   Reduce your risk of any infection by using the same precautions used for avoiding the common cold or flu:  Marland Kitchen Wash your hands often with soap and warm water for at least 20 seconds.  If soap and water are not readily available, use an alcohol-based hand sanitizer with at least 60% alcohol.  . If coughing or sneezing, cover your mouth and nose by coughing or sneezing into the elbow areas of your shirt or coat, into a tissue or into your sleeve (not your hands). . Avoid shaking hands with others and consider head nods or verbal greetings only. . Avoid touching your eyes, nose, or mouth with unwashed hands.  . Avoid close contact  with people who are sick. . Avoid places or events with large numbers of people in one location, like concerts or sporting events. . Carefully consider travel plans you have or are making. . If you are planning any travel outside or inside the [place], visit the CDC's Travelers' Health webpage for the latest health notices. . If you have some symptoms but not all symptoms, continue to monitor at home and seek medical attention if your symptoms worsen. . If you are having a medical emergency, call 911.  HOME  CARE . Only take medications as instructed by your medical team. . Drink plenty of fluids and get plenty of rest. . A steam or ultrasonic humidifier can help if you have congestion.   GET HELP RIGHT AWAY IF YOU HAVE EMERGENCY WARNING SIGNS** FOR COVID-19. If you or someone is showing any of these signs seek emergency medical care immediately. Call 911 or proceed to your closest emergency facility if: . You develop worsening high fever. . Trouble breathing . Bluish lips or face . Persistent pain or pressure in the chest . New confusion . Inability to wake or stay awake . You cough up blood. . Your symptoms become more severe  **This list is not all possible symptoms. Contact your medical provider for any symptoms that are sever or concerning to you.   MAKE SURE YOU   Understand these instructions.  Will watch your condition.  Will get help right away if you are not doing well or get worse.  Your e-visit answers were reviewed by a board certified advanced clinical practitioner to complete your personal care plan.  Depending on the condition, your plan could have included both over the counter or prescription medications.  If there is a problem please reply once you have received a response from your provider.  Your safety is important to [place].  If you have drug allergies check your prescription carefully.    You can use MyChart to ask questions about today's visit, request a non-urgent call back, or ask for a work or school excuse for 24 hours related to this e-Visit. If it has been greater than 24 hours you will need to follow up with your provider, or enter a new e-Visit to address those concerns. You will get an e-mail in the next two days asking about your experience.  I hope that your e-visit has been valuable and will speed your recovery. Thank you for using e-visits.   Approximately 5 minutes was spent documenting and reviewing patient's chart.

## 2019-10-26 ENCOUNTER — Other Ambulatory Visit: Payer: Self-pay

## 2019-10-26 DIAGNOSIS — Z20822 Contact with and (suspected) exposure to covid-19: Secondary | ICD-10-CM

## 2019-10-27 LAB — NOVEL CORONAVIRUS, NAA: SARS-CoV-2, NAA: NOT DETECTED

## 2019-10-28 ENCOUNTER — Other Ambulatory Visit: Payer: Self-pay

## 2019-10-28 ENCOUNTER — Encounter (HOSPITAL_COMMUNITY): Payer: Self-pay

## 2019-10-28 ENCOUNTER — Emergency Department (HOSPITAL_COMMUNITY)
Admission: EM | Admit: 2019-10-28 | Discharge: 2019-10-28 | Disposition: A | Discharge status: Home / Self Care | Payer: Medicaid Other | Attending: Emergency Medicine | Admitting: Emergency Medicine

## 2019-10-28 DIAGNOSIS — F17290 Nicotine dependence, other tobacco product, uncomplicated: Secondary | ICD-10-CM | POA: Insufficient documentation

## 2019-10-28 DIAGNOSIS — J029 Acute pharyngitis, unspecified: Secondary | ICD-10-CM | POA: Insufficient documentation

## 2019-10-28 LAB — GROUP A STREP BY PCR: Group A Strep by PCR: NOT DETECTED

## 2019-10-28 MED ORDER — NAPROXEN 500 MG PO TABS: 500.0000 mg | ORAL_TABLET | Freq: Two times a day (BID) | ORAL | 0 refills | Status: DC

## 2019-10-28 NOTE — ED Notes (Signed)
Pt ambulated to RR independently

## 2019-10-28 NOTE — ED Provider Notes (Signed)
[facility] DEPT Provider Note   CSN: [id number] Arrival date & time: 10/28/19  1219     History   Chief Complaint Chief Complaint  Patient presents with  . Sore Throat    HPI Maria Sampson is a 28 y.o. female with a history of tobacco abuse, depression, and anemia who presents to the emergency department with complaints of sore throat for the past few days.  Patient states that her throat pain is intermittent, worse with swallowing but she is able to swallow, mildly alleviated with Tylenol.  She had associated low-grade temp of 100.3 yesterday.  Denies congestion, ear pain, dyspnea, cough, chest pain, nausea, vomiting, or diarrhea.  She found out she was exposed to somebody on Thanksgiving that began having symptoms a few days later and then subsequently tested positive for COVID-19, she herself had a COVID-19 test that was negative.    HPI  Past Medical History:  Diagnosis Date  . Depression   . Otitis externa due to Pseudomonas aeruginosa 04/2014    Patient Active Problem List   Diagnosis Date Noted  . Constipation 02/26/2017  . MDD (major depressive disorder), recurrent severe, without psychosis ([place]) 02/26/2017  . Suicide attempt ([place]) 02/20/2017  . Suicide attempt by acetaminophen overdose ([place]) 02/20/2017  . Mild tetrahydrocannabinol (THC) abuse 02/20/2017  . BPV (benign positional vertigo) 10/27/2015  . Malnutrition of moderate degree 10/26/2015  . Abdominal pain 10/23/2015  . Nausea & vomiting 10/23/2015  . Sepsis ([place]) 10/23/2015  . Fibroid, uterine 10/23/2015  . Ileus ([place]) 10/23/2015  . Normocytic anemia 10/23/2015  . Overdose 06/03/2014  . Depressive disorder 06/03/2014  . Suicidal ideation 06/03/2014  . Otitis externa due to Pseudomonas aeruginosa 05/03/2014    Past Surgical History:  Procedure Laterality Date  . DENTAL EXAMINATION UNDER ANESTHESIA       OB History   No obstetric history on file.      Home  Medications    Prior to Admission medications   Medication Sig Start Date End Date Taking? Authorizing Provider  cyclobenzaprine (FLEXERIL) 10 MG tablet Take 1 tablet (10 mg total) by mouth 3 (three) times daily as needed for muscle spasms. Patient not taking: Reported on 10/12/2019 02/19/19   Kennyth Arnold, FNP  diphenhydramine-acetaminophen (TYLENOL PM) 25-500 MG TABS tablet Take 1 tablet by mouth at bedtime as needed.    [provider]  naproxen (NAPROSYN) 500 MG tablet Take 1 tablet (500 mg total) by mouth 2 (two) times daily with a meal. Patient not taking: Reported on 07/06/2019 02/19/19   Kennyth Arnold, FNP  ondansetron (ZOFRAN ODT) 8 MG disintegrating tablet Take 1 tablet (8 mg total) by mouth every 8 (eight) hours as needed for nausea. 10/12/19   Varney Biles, MD  ondansetron (ZOFRAN) 4 MG tablet Take 1 tablet (4 mg total) by mouth every 6 (six) hours. Patient not taking: Reported on 11/26/2018 10/30/18   Nat Christen, MD  oxyCODONE-acetaminophen (PERCOCET/ROXICET) 5-325 MG tablet Take 1 tablet by mouth every 4 (four) hours as needed for severe pain. Patient not taking: Reported on 11/26/2018 10/30/18   Nat Christen, MD  predniSONE (DELTASONE) 20 MG tablet 3 tabs po daily x 3 days, then 2 tabs x 3 days, then 1.5 tabs x 3 days, then 1 tab x 3 days, then 0.5 tabs x 3 days Patient not taking: Reported on 10/30/2018 06/25/18   Orpah Greek, MD  traMADol (ULTRAM) 50 MG tablet Take 1 tablet (50 mg total) by  mouth every 6 (six) hours as needed. Patient not taking: Reported on 10/30/2018 06/25/18   Orpah Greek, MD    Family History Family History  Problem Relation Age of Onset  . Crohn's disease Other   . Crohn's disease Mother     Social History Social History   Tobacco Use  . Smoking status: Current Every Day Smoker    Types: Cigars  . Smokeless tobacco: Never Used  . Tobacco comment: 2 per day  Substance Use Topics  . Alcohol use: Yes    Comment: Social   . Drug use: Yes    Types: Marijuana     Allergies   Peanut-containing drug products   Review of Systems Review of Systems  Constitutional: Positive for fever. Negative for chills.  HENT: Positive for sore throat. Negative for congestion and ear pain.   Respiratory: Negative for cough and shortness of breath.   Cardiovascular: Negative for chest pain and leg swelling.  Gastrointestinal: Negative for abdominal pain, nausea and vomiting.  Genitourinary: Negative for dysuria.  Neurological: Negative for syncope.     Physical Exam Updated Vital Signs BP 122/83   Pulse 83   Temp 98.1 F (36.7 C) (Oral)   Resp 16   SpO2 100%   Physical Exam Vitals signs and nursing note reviewed.  Constitutional:      General: She is not in acute distress.    Appearance: She is well-developed.  HENT:     Head: Normocephalic and atraumatic.     Right Ear: Ear canal normal. Tympanic membrane is not perforated, erythematous, retracted or bulging.     Left Ear: Ear canal normal. Tympanic membrane is not perforated, erythematous, retracted or bulging.     Ears:     Comments: No mastoid erythema/swelling/tenderness.     Nose:     Right Sinus: No maxillary sinus tenderness or frontal sinus tenderness.     Left Sinus: No maxillary sinus tenderness or frontal sinus tenderness.     Mouth/Throat:     Pharynx: Uvula midline. Posterior oropharyngeal erythema present. No oropharyngeal exudate.     Comments: Posterior oropharynx is symmetric appearing. Patient tolerating own secretions without difficulty. No trismus. No drooling. No hot potato voice. No swelling beneath the tongue, submandibular compartment is soft.  Eyes:     General:        Right eye: No discharge.        Left eye: No discharge.     Conjunctiva/sclera: Conjunctivae normal.     Pupils: Pupils are equal, round, and reactive to light.  Neck:     Musculoskeletal: Normal range of motion and neck supple. No edema, neck rigidity or  crepitus.  Cardiovascular:     Rate and Rhythm: Normal rate and regular rhythm.     Heart sounds: No murmur.  Pulmonary:     Effort: Pulmonary effort is normal. No respiratory distress.     Breath sounds: Normal breath sounds. No wheezing, rhonchi or rales.  Abdominal:     General: There is no distension.     Palpations: Abdomen is soft.     Tenderness: There is no abdominal tenderness.  Lymphadenopathy:     Cervical: No cervical adenopathy.  Skin:    General: Skin is warm and dry.     Findings: No rash.  Neurological:     Mental Status: She is alert.  Psychiatric:        Behavior: Behavior normal.    ED Treatments / Results  Labs (all  labs ordered are listed, but only abnormal results are displayed) Labs Reviewed  GROUP A STREP BY PCR    EKG None  Radiology No results found.  Procedures Procedures (including critical care time)  Medications Ordered in ED Medications - No data to display   Initial Impression / Assessment and Plan / ED Course  I have reviewed the triage vital signs and the nursing notes.  Pertinent labs & imaging results that were available during my care of the patient were reviewed by me and considered in my medical decision making (see chart for details).   Patient presents to the emergency department with complaints of sore throat over the past few days.  Patient nontoxic-appearing, resting comfortably, vitals WNL.  Strep test is negative.  Exam not consistent with RPA/PTA.  Recent negative Covid testing.  Likely viral.  Will treat with naproxen.  PCP follow-up. I discussed results, treatment plan, need for follow-up, and return precautions with the patient. Provided opportunity for questions, patient confirmed understanding and is in agreement with plan.    Final Clinical Impressions(s) / ED Diagnoses   Final diagnoses:  Sore throat    ED Discharge Orders         Ordered    naproxen (NAPROSYN) 500 MG tablet  2 times daily     10/28/19  1351         TYSON COTTIER was evaluated in Emergency Department on 10/28/2019 for the symptoms described in the history of present illness. He/she was evaluated in the context of the global COVID-19 pandemic, which necessitated consideration that the patient might be at risk for infection with the SARS-CoV-2 virus that causes COVID-19. Institutional protocols and algorithms that pertain to the evaluation of patients at risk for COVID-19 are in a state of rapid change based on information released by regulatory bodies including the CDC and federal and state organizations. These policies and algorithms were followed during the patient's care in the ED.    Amaryllis Dyke, [place] 10/28/19 1419    Margette Fast, MD 10/28/19 1948

## 2019-10-28 NOTE — ED Notes (Signed)
Pt verbalizes understanding of DC instructions. Pt belongings returned and is ambulatory out of ED.  

## 2019-10-28 NOTE — ED Triage Notes (Signed)
Pt states that she has a sore throat x several days. Pt states that she was tested for COVID and was negative 10/26/19.

## 2019-10-28 NOTE — Discharge Instructions (Addendum)
You were seen in the emergency department today for a sore throat.  Your strep test was negative.  We anticipate this is a virus or possibly allergies.  We are sending her with naproxen to help with this. - Naproxen is a nonsteroidal anti-inflammatory medication that will help with pain and swelling. Be sure to take this medication as prescribed with food, 1 pill every 12 hours,  It should be taken with food, as it can cause stomach upset, and more seriously, stomach bleeding. Do not take other nonsteroidal anti-inflammatory medications with this such as Advil, Motrin, Aleve, Mobic, Goodie Powder, or Motrin.    You make take Tylenol per over the counter dosing with these medications.   We have prescribed you new medication(s) today. Discuss the medications prescribed today with your pharmacist as they can have adverse effects and interactions with your other medicines including over the counter and prescribed medications. Seek medical evaluation if you start to experience new or abnormal symptoms after taking one of these medicines, seek care immediately if you start to experience difficulty breathing, feeling of your throat closing, facial swelling, or rash as these could be indications of a more serious allergic reaction   Please follow-up with your primary care provider within 1 week.  Return to the ER for new or worsening symptoms including but not limited to worsening pain, difficulty swallowing, difficulty breathing, difficulty turning her head or opening your mouth, fever, inability swallowing saliva, or any other concerns.

## 2019-11-04 ENCOUNTER — Telehealth: Payer: Medicaid Other | Admitting: Nurse Practitioner

## 2019-11-04 DIAGNOSIS — R05 Cough: Secondary | ICD-10-CM

## 2019-11-04 DIAGNOSIS — J029 Acute pharyngitis, unspecified: Secondary | ICD-10-CM

## 2019-11-04 DIAGNOSIS — Z20822 Contact with and (suspected) exposure to covid-19: Secondary | ICD-10-CM

## 2019-11-04 DIAGNOSIS — R52 Pain, unspecified: Secondary | ICD-10-CM

## 2019-11-04 DIAGNOSIS — R059 Cough, unspecified: Secondary | ICD-10-CM

## 2019-11-04 DIAGNOSIS — R5081 Fever presenting with conditions classified elsewhere: Secondary | ICD-10-CM

## 2019-11-04 MED ORDER — BENZONATATE 100 MG PO CAPS: 100.0000 mg | ORAL_CAPSULE | Freq: Three times a day (TID) | ORAL | 0 refills | Status: DC | PRN

## 2019-11-04 NOTE — Progress Notes (Signed)
E-Visit for Corona Virus Screening   Your current symptoms could be consistent with the coronavirus.  Many health care providers can now test patients at their office but not all are.  [facility] has multiple testing sites. For information on our COVID testing locations and hours go to [email or web address]   Testing Information: The COVID-19 Community Testing sites will begin testing BY APPOINTMENT ONLY starting the week of December 15th and December 16th (see go-live dates by location below).  You can begin scheduling online at [email or web address]  If you do not have access to a smart phone or computer you may call [phone] for an appointment. . In addition, starting the week of December 14th we will move INDOORS for testing (see locations below). . Community testing site appointment hours will be 8 a.m. to 3:30 p.m.   Testing Locations: [place] will begin Tuesday December 15th. Closed on Monday December 14th to relocate indoors to [address], [place] [address] [facility] Appointments will begin Wednesday December 16th. Closed on Tuesday December 15th for move to indoors at [place]. [address], [place], [place] [address] [facility] Appointments will begin Wednesday December 16th. Closed on Tuesday December 15th for move to indoors at [address] [place] [address]   We are enrolling you in our [facility] for [place] . Daily you will receive a questionnaire within the [facility] website. Our COVID 19 response team will be monitoring your responses daily.  Please quarantine yourself while awaiting your test results. If you develop fever/cough/breathlessness, please stay home for 10 days with improving symptoms and until you have had 24 hours of no fever (without taking a fever reducer).  You should wear a mask or cloth face covering over your nose and mouth if you must be around other people or animals, including pets (even at  home). Try to stay at least 6 feet away from other people. This will protect the people around you.  Please continue good preventive care measures, including:  frequent hand-washing, avoid touching your face, cover coughs/sneezes, stay out of crowds and keep a 6 foot distance from others.  COVID-19 is a respiratory illness with symptoms that are similar to the flu. Symptoms are typically mild to moderate, but there have been cases of severe illness and death due to the virus.   The following symptoms may appear 2-14 days after exposure: . Fever . Cough . Shortness of breath or difficulty breathing . Chills . Repeated shaking with chills . Muscle pain . Headache . Sore throat . New loss of taste or smell . Fatigue . Congestion or runny nose . Nausea or vomiting . Diarrhea  Go to the nearest hospital ED for assessment if fever/cough/breathlessness are severe or illness seems like a threat to life.  It is vitally important that if you feel that you have an infection such as this virus or any other virus that you stay home and away from places where you may spread it to others.  You should avoid contact with people age 8 and older.   You can use medication such as A prescription cough medication called Tessalon Perles 100 mg. You may take 1-2 capsules every 8 hours as needed for cough  You may also take acetaminophen (Tylenol) as needed for fever.  Reduce your risk of any infection by using the same precautions used for avoiding the common cold or flu:  Marland Kitchen Wash your hands often with soap and warm water for  at least 20 seconds.  If soap and water are not readily available, use an alcohol-based hand sanitizer with at least 60% alcohol.  . If coughing or sneezing, cover your mouth and nose by coughing or sneezing into the elbow areas of your shirt or coat, into a tissue or into your sleeve (not your hands). . Avoid shaking hands with others and consider head nods or verbal greetings only. . Avoid  touching your eyes, nose, or mouth with unwashed hands.  . Avoid close contact with people who are sick. . Avoid places or events with large numbers of people in one location, like concerts or sporting events. . Carefully consider travel plans you have or are making. . If you are planning any travel outside or inside the [place], visit the CDC's Travelers' Health webpage for the latest health notices. . If you have some symptoms but not all symptoms, continue to monitor at home and seek medical attention if your symptoms worsen. . If you are having a medical emergency, call 911.  HOME CARE . Only take medications as instructed by your medical team. . Drink plenty of fluids and get plenty of rest. . A steam or ultrasonic humidifier can help if you have congestion.   GET HELP RIGHT AWAY IF YOU HAVE EMERGENCY WARNING SIGNS** FOR COVID-19. If you or someone is showing any of these signs seek emergency medical care immediately. Call 911 or proceed to your closest emergency facility if: . You develop worsening high fever. . Trouble breathing . Bluish lips or face . Persistent pain or pressure in the chest . New confusion . Inability to wake or stay awake . You cough up blood. . Your symptoms become more severe  **This list is not all possible symptoms. Contact your medical provider for any symptoms that are sever or concerning to you.  MAKE SURE YOU   Understand these instructions.  Will watch your condition.  Will get help right away if you are not doing well or get worse.  Your e-visit answers were reviewed by a board certified advanced clinical practitioner to complete your personal care plan.  Depending on the condition, your plan could have included both over the counter or prescription medications.  If there is a problem please reply once you have received a response from your provider.  Your safety is important to [place].  If you have drug allergies check your prescription carefully.     You can use MyChart to ask questions about today's visit, request a non-urgent call back, or ask for a work or school excuse for 24 hours related to this e-Visit. If it has been greater than 24 hours you will need to follow up with your provider, or enter a new e-Visit to address those concerns. You will get an e-mail in the next two days asking about your experience.  I hope that your e-visit has been valuable and will speed your recovery. Thank you for using e-visits.   5-10 minutes spent reviewing and documenting in chart.

## 2019-11-08 ENCOUNTER — Encounter (HOSPITAL_COMMUNITY): Payer: Self-pay | Admitting: Emergency Medicine

## 2019-11-08 ENCOUNTER — Other Ambulatory Visit: Payer: Self-pay

## 2019-11-08 ENCOUNTER — Emergency Department (HOSPITAL_COMMUNITY)
Admission: EM | Admit: 2019-11-08 | Discharge: 2019-11-08 | Disposition: A | Discharge status: Home / Self Care | Payer: Medicaid Other | Attending: Emergency Medicine | Admitting: Emergency Medicine

## 2019-11-08 DIAGNOSIS — Z91010 Allergy to peanuts: Secondary | ICD-10-CM | POA: Insufficient documentation

## 2019-11-08 DIAGNOSIS — F17290 Nicotine dependence, other tobacco product, uncomplicated: Secondary | ICD-10-CM | POA: Insufficient documentation

## 2019-11-08 DIAGNOSIS — F1210 Cannabis abuse, uncomplicated: Secondary | ICD-10-CM | POA: Insufficient documentation

## 2019-11-08 DIAGNOSIS — B349 Viral infection, unspecified: Secondary | ICD-10-CM | POA: Insufficient documentation

## 2019-11-08 DIAGNOSIS — Z20828 Contact with and (suspected) exposure to other viral communicable diseases: Secondary | ICD-10-CM | POA: Insufficient documentation

## 2019-11-08 NOTE — Discharge Instructions (Addendum)
Please sign up with my chart to get your results   Person Under Monitoring Name: Maria Sampson  Location: [facility] [place] [address]   Infection Prevention Recommendations for Individuals Confirmed to have, or Being Evaluated for, 2019 Novel Coronavirus (COVID-19) Infection Who Receive Care at Home  Individuals who are confirmed to have, or are being evaluated for, COVID-19 should follow the prevention steps below until a healthcare provider or local or state health department says they can return to normal activities.  Stay home except to get medical care You should restrict activities outside your home, except for getting medical care. Do not go to work, school, or public areas, and do not use public transportation or taxis.  Call ahead before visiting your doctor Before your medical appointment, call the healthcare provider and tell them that you have, or are being evaluated for, COVID-19 infection. This will help the healthcare provider's office take steps to keep other people from getting infected. Ask your healthcare provider to call the local or state health department.  Monitor your symptoms Seek prompt medical attention if your illness is worsening (e.g., difficulty breathing). Before going to your medical appointment, call the healthcare provider and tell them that you have, or are being evaluated for, COVID-19 infection. Ask your healthcare provider to call the local or state health department.  Wear a facemask You should wear a facemask that covers your nose and mouth when you are in the same room with other people and when you visit a healthcare provider. People who live with or visit you should also wear a facemask while they are in the same room with you.  Separate yourself from other people in your home As much as possible, you should stay in a different room from other people in your home. Also, you should use a separate bathroom, if  available.  Avoid sharing household items You should not share dishes, drinking glasses, cups, eating utensils, towels, bedding, or other items with other people in your home. After using these items, you should wash them thoroughly with soap and water.  Cover your coughs and sneezes Cover your mouth and nose with a tissue when you cough or sneeze, or you can cough or sneeze into your sleeve. Throw used tissues in a lined trash can, and immediately wash your hands with soap and water for at least 20 seconds or use an alcohol-based hand rub.  Wash your [facility] your hands often and thoroughly with soap and water for at least 20 seconds. You can use an alcohol-based hand sanitizer if soap and water are not available and if your hands are not visibly dirty. Avoid touching your eyes, nose, and mouth with unwashed hands.   Prevention Steps for Caregivers and Household Members of Individuals Confirmed to have, or Being Evaluated for, COVID-19 Infection Being Cared for in the Home  If you live with, or provide care at home for, a person confirmed to have, or being evaluated for, COVID-19 infection please follow these guidelines to prevent infection:  Follow healthcare provider's instructions Make sure that you understand and can help the patient follow any healthcare provider instructions for all care.  Provide for the patient's basic needs You should help the patient with basic needs in the home and provide support for getting groceries, prescriptions, and other personal needs.  Monitor the patient's symptoms If they are getting sicker, call his or her medical provider and tell them that the patient  has, or is being evaluated for, COVID-19 infection. This will help the healthcare provider's office take steps to keep other people from getting infected. Ask the healthcare provider to call the local or state health department.  Limit the number of people who have contact with the  patient If possible, have only one caregiver for the patient. Other household members should stay in another home or place of residence. If this is not possible, they should stay in another room, or be separated from the patient as much as possible. Use a separate bathroom, if available. Restrict visitors who do not have an essential need to be in the home.  Keep older adults, very young children, and other sick people away from the patient Keep older adults, very young children, and those who have compromised immune systems or chronic health conditions away from the patient. This includes people with chronic heart, lung, or kidney conditions, diabetes, and cancer.  Ensure good ventilation Make sure that shared spaces in the home have good air flow, such as from an air conditioner or an opened window, weather permitting.  Wash your hands often Wash your hands often and thoroughly with soap and water for at least 20 seconds. You can use an alcohol based hand sanitizer if soap and water are not available and if your hands are not visibly dirty. Avoid touching your eyes, nose, and mouth with unwashed hands. Use disposable paper towels to dry your hands. If not available, use dedicated cloth towels and replace them when they become wet.  Wear a facemask and gloves Wear a disposable facemask at all times in the room and gloves when you touch or have contact with the patient's blood, body fluids, and/or secretions or excretions, such as sweat, saliva, sputum, nasal mucus, vomit, urine, or feces.  Ensure the mask fits over your nose and mouth tightly, and do not touch it during use. Throw out disposable facemasks and gloves after using them. Do not reuse. Wash your hands immediately after removing your facemask and gloves. If your personal clothing becomes contaminated, carefully remove clothing and launder. Wash your hands after handling contaminated clothing. Place all used disposable facemasks,  gloves, and other waste in a lined container before disposing them with other household waste. Remove gloves and wash your hands immediately after handling these items.  Do not share dishes, glasses, or other household items with the patient Avoid sharing household items. You should not share dishes, drinking glasses, cups, eating utensils, towels, bedding, or other items with a patient who is confirmed to have, or being evaluated for, COVID-19 infection. After the person uses these items, you should wash them thoroughly with soap and water.  Wash laundry thoroughly Immediately remove and wash clothes or bedding that have blood, body fluids, and/or secretions or excretions, such as sweat, saliva, sputum, nasal mucus, vomit, urine, or feces, on them. Wear gloves when handling laundry from the patient. Read and follow directions on labels of laundry or clothing items and detergent. In general, wash and dry with the warmest temperatures recommended on the label.  Clean all areas the individual has used often Clean all touchable surfaces, such as counters, tabletops, doorknobs, bathroom fixtures, toilets, phones, keyboards, tablets, and bedside tables, every day. Also, clean any surfaces that may have blood, body fluids, and/or secretions or excretions on them. Wear gloves when cleaning surfaces the patient has come in contact with. Use a diluted bleach solution (e.g., dilute bleach with 1 part bleach and 10 parts water) or  a household disinfectant with a label that says EPA-registered for coronaviruses. To make a bleach solution at home, add 1 tablespoon of bleach to 1 quart (4 cups) of water. For a larger supply, add  cup of bleach to 1 gallon (16 cups) of water. Read labels of cleaning products and follow recommendations provided on product labels. Labels contain instructions for safe and effective use of the cleaning product including precautions you should take when applying the product, such as  wearing gloves or eye protection and making sure you have good ventilation during use of the product. Remove gloves and wash hands immediately after cleaning.  Monitor yourself for signs and symptoms of illness Caregivers and household members are considered close contacts, should monitor their health, and will be asked to limit movement outside of the home to the extent possible. Follow the monitoring steps for close contacts listed on the symptom monitoring form.   ? If you have additional questions, contact your local health department or call the epidemiologist on call at [phone] (available 24/7). ? This guidance is subject to change. For the most up-to-date guidance from [facility], please refer to their website: [email or web address]

## 2019-11-08 NOTE — ED Provider Notes (Signed)
[facility] DEPT Provider Note   CSN: [id number] Arrival date & time: 11/08/19  1452     History Chief Complaint  Patient presents with  . covid exposure  . Generalized Body Aches  . Sore Throat  . Fatigue    CAMBRA Maria Sampson is a 28 y.o. female.  28 year old female presents with concern for possible Covid exposure.  Patient has been exposed to her partner who just tested positive for Covid.  She notes sore throat but denies any emesis.  No cough or dyspnea.  No diarrhea.  No abdominal pain.  Mild headache which has been intermittent and relieved with over-the-counter medications.  Denies any chronic medical conditions.        Past Medical History:  Diagnosis Date  . Depression   . Otitis externa due to Pseudomonas aeruginosa 04/2014    Patient Active Problem List   Diagnosis Date Noted  . Constipation 02/26/2017  . MDD (major depressive disorder), recurrent severe, without psychosis ([place]) 02/26/2017  . Suicide attempt ([place]) 02/20/2017  . Suicide attempt by acetaminophen overdose ([place]) 02/20/2017  . Mild tetrahydrocannabinol (THC) abuse 02/20/2017  . BPV (benign positional vertigo) 10/27/2015  . Malnutrition of moderate degree 10/26/2015  . Abdominal pain 10/23/2015  . Nausea & vomiting 10/23/2015  . Sepsis ([place]) 10/23/2015  . Fibroid, uterine 10/23/2015  . Ileus ([place]) 10/23/2015  . Normocytic anemia 10/23/2015  . Overdose 06/03/2014  . Depressive disorder 06/03/2014  . Suicidal ideation 06/03/2014  . Otitis externa due to Pseudomonas aeruginosa 05/03/2014    Past Surgical History:  Procedure Laterality Date  . DENTAL EXAMINATION UNDER ANESTHESIA       OB History   No obstetric history on file.     Family History  Problem Relation Age of Onset  . Crohn's disease Other   . Crohn's disease Mother     Social History   Tobacco Use  . Smoking status: Current Every Day Smoker    Types: Cigars  . Smokeless tobacco: Never  Used  . Tobacco comment: 2 per day  Substance Use Topics  . Alcohol use: Yes    Comment: Social  . Drug use: Yes    Types: Marijuana    Home Medications Prior to Admission medications   Medication Sig Start Date End Date Taking? Authorizing Provider  benzonatate (TESSALON PERLES) 100 MG capsule Take 1 capsule (100 mg total) by mouth 3 (three) times daily as needed. 11/04/19   Hassell Done, Mary-Margaret, FNP  diphenhydramine-acetaminophen (TYLENOL PM) 25-500 MG TABS tablet Take 1 tablet by mouth at bedtime as needed.    [provider]  naproxen (NAPROSYN) 500 MG tablet Take 1 tablet (500 mg total) by mouth 2 (two) times daily. 10/28/19   Petrucelli, Samantha R, PA-C  ondansetron (ZOFRAN ODT) 8 MG disintegrating tablet Take 1 tablet (8 mg total) by mouth every 8 (eight) hours as needed for nausea. 10/12/19   Varney Biles, MD    Allergies    Peanut-containing drug products  Review of Systems   Review of Systems  All other systems reviewed and are negative.   Physical Exam Updated Vital Signs BP (!) 149/101 (BP Location: Right Arm)   Pulse 89   Temp 98.5 F (36.9 C) (Oral)   Resp 18   LMP 11/01/2019   SpO2 100%   Physical Exam Vitals and nursing note reviewed.  Constitutional:      General: She is not in acute distress.    Appearance: Normal appearance. She is well-developed.  She is not toxic-appearing.  HENT:     Head: Normocephalic and atraumatic.  Eyes:     General: Lids are normal.     Conjunctiva/sclera: Conjunctivae normal.     Pupils: Pupils are equal, round, and reactive to light.  Neck:     Thyroid: No thyroid mass.     Trachea: No tracheal deviation.  Cardiovascular:     Rate and Rhythm: Normal rate and regular rhythm.     Heart sounds: Normal heart sounds. No murmur. No gallop.   Pulmonary:     Effort: Pulmonary effort is normal. No respiratory distress.     Breath sounds: Normal breath sounds. No stridor. No decreased breath sounds, wheezing,  rhonchi or rales.  Abdominal:     General: Bowel sounds are normal. There is no distension.     Palpations: Abdomen is soft.     Tenderness: There is no abdominal tenderness. There is no rebound.  Musculoskeletal:        General: No tenderness. Normal range of motion.     Cervical back: Normal range of motion and neck supple.  Skin:    General: Skin is warm and dry.     Findings: No abrasion or rash.  Neurological:     Mental Status: She is alert and oriented to person, place, and time.     GCS: GCS eye subscore is 4. GCS verbal subscore is 5. GCS motor subscore is 6.     Cranial Nerves: No cranial nerve deficit.     Sensory: No sensory deficit.  Psychiatric:        Speech: Speech normal.        Behavior: Behavior normal.     ED Results / Procedures / Treatments   Labs (all labs ordered are listed, but only abnormal results are displayed) Labs Reviewed  NOVEL CORONAVIRUS, NAA (HOSP ORDER, SEND-OUT TO REF LAB; TAT 18-24 HRS)    EKG None  Radiology No results found.  Procedures Procedures (including critical care time)  Medications Ordered in ED Medications - No data to display  ED Course  I have reviewed the triage vital signs and the nursing notes.  Pertinent labs & imaging results that were available during my care of the patient were reviewed by me and considered in my medical decision making (see chart for details).    MDM Rules/Calculators/A&P                      Covid test sent and patient given quarantine precautions Final Clinical Impression(s) / ED Diagnoses Final diagnoses:  None    Rx / DC Orders ED Discharge Orders    None       Lacretia Leigh, MD 11/08/19 1530

## 2019-11-08 NOTE — ED Triage Notes (Signed)
Pt was exposed to Covid by someone staying at house. Reports body aches and tired since Thursday.

## 2019-11-10 ENCOUNTER — Telehealth: Payer: Medicaid Other | Admitting: Emergency Medicine

## 2019-11-10 DIAGNOSIS — R6889 Other general symptoms and signs: Secondary | ICD-10-CM

## 2019-11-10 LAB — NOVEL CORONAVIRUS, NAA (HOSP ORDER, SEND-OUT TO REF LAB; TAT 18-24 HRS): SARS-CoV-2, NAA: NOT DETECTED

## 2019-11-10 MED ORDER — OSELTAMIVIR PHOSPHATE 75 MG PO CAPS: 75.0000 mg | ORAL_CAPSULE | Freq: Two times a day (BID) | ORAL | 0 refills | Status: DC

## 2019-11-10 NOTE — Progress Notes (Signed)
E visit for Flu like symptoms   We are sorry that you are not feeling well.  Here is how we plan to help! Based on what you have shared with me it looks like you may have a respiratory virus that may be influenza.  Influenza or "the flu" is   an infection caused by a respiratory virus. The flu virus is highly contagious and persons who did not receive their yearly flu vaccination may "catch" the flu from close contact.  We have anti-viral medications to treat the viruses that cause this infection. They are not a "cure" and only shorten the course of the infection. These prescriptions are most effective when they are given within the first 2 days of "flu" symptoms. Antiviral medication are indicated if you have a high risk of complications from the flu. You should  also consider an antiviral medication if you are in close contact with someone who is at risk. These medications can help patients avoid complications from the flu  but have side effects that you should know. Possible side effects from Tamiflu or oseltamivir include nausea, vomiting, diarrhea, dizziness, headaches, eye redness, sleep problems or other respiratory symptoms. You should not take Tamiflu if you have an allergy to oseltamivir or any to the ingredients in Tamiflu.  Based upon your symptoms and potential risk factors I recommend that you follow the flu symptoms recommendation that I have listed below.   You could very easily have the flu.  I haven't seen any positive flu tests in our area yet this year, but that doesn't mean it isn't around.  I did review your COVID test.  Unfortunately, the treatment for flu is generally only effective if started within 2 days.  If you want to try it, you can, but it may not work.  I've sent it to your pharmacy.  Otherwise, stay hydrated, take over-the-counter cough and cold medicine and make an in-person visit if you get worse.  ANYONE WHO HAS FLU SYMPTOMS SHOULD: . Stay home. The flu is  highly contagious and going out or to work exposes others! . Be sure to drink plenty of fluids. Water is fine as well as fruit juices, sodas and electrolyte beverages. You may want to stay away from caffeine or alcohol. If you are nauseated, try taking small sips of liquids. How do you know if you are getting enough fluid? Your urine should be a pale yellow or almost colorless. . Get rest. . Taking a steamy shower or using a humidifier may help nasal congestion and ease sore throat pain. Using a saline nasal spray works much the same way. . Cough drops, hard candies and sore throat lozenges may ease your cough. . Line up a caregiver. Have someone check on you regularly.   GET HELP RIGHT AWAY IF: . You cannot keep down liquids or your medications. . You become short of breath . Your fell like you are going to pass out or loose consciousness. . Your symptoms persist after you have completed your treatment plan MAKE SURE YOU   Understand these instructions.  Will watch your condition.  Will get help right away if you are not doing well or get worse.  Your e-visit answers were reviewed by a board certified advanced clinical practitioner to complete your personal care plan.  Depending on the condition, your plan could have included both over the counter or prescription medications.  If there is a problem please reply  once you have received a response  from your provider.  Your safety is important to [place].  If you have drug allergies check your prescription carefully.    You can use MyChart to ask questions about today's visit, request a non-urgent call back, or ask for a work or school excuse for 24 hours related to this e-Visit. If it has been greater than 24 hours you will need to follow up with your provider, or enter a new e-Visit to address those concerns.  You will get an e-mail in the next two days asking about your experience.  I hope that your e-visit has been valuable and will speed  your recovery. Thank you for using e-visits.  Greater than 5 minutes, yet less than 10 minutes was used in reviewing the patient's chart, questionnaire, prescribing medications, and documentation for this visit.

## 2019-12-21 ENCOUNTER — Emergency Department (HOSPITAL_COMMUNITY)
Admission: EM | Admit: 2019-12-21 | Discharge: 2019-12-21 | Disposition: A | Discharge status: Home / Self Care | Payer: Self-pay | Attending: Emergency Medicine | Admitting: Emergency Medicine

## 2019-12-21 ENCOUNTER — Encounter (HOSPITAL_COMMUNITY): Payer: Self-pay

## 2019-12-21 ENCOUNTER — Emergency Department (HOSPITAL_COMMUNITY): Payer: Self-pay

## 2019-12-21 ENCOUNTER — Other Ambulatory Visit: Payer: Self-pay

## 2019-12-21 DIAGNOSIS — Z87891 Personal history of nicotine dependence: Secondary | ICD-10-CM | POA: Insufficient documentation

## 2019-12-21 DIAGNOSIS — Z91010 Allergy to peanuts: Secondary | ICD-10-CM | POA: Insufficient documentation

## 2019-12-21 DIAGNOSIS — J40 Bronchitis, not specified as acute or chronic: Secondary | ICD-10-CM | POA: Insufficient documentation

## 2019-12-21 DIAGNOSIS — R0602 Shortness of breath: Secondary | ICD-10-CM

## 2019-12-21 DIAGNOSIS — Z20822 Contact with and (suspected) exposure to covid-19: Secondary | ICD-10-CM | POA: Insufficient documentation

## 2019-12-21 LAB — POC SARS CORONAVIRUS 2 AG -  ED: SARS Coronavirus 2 Ag: NEGATIVE

## 2019-12-21 MED ORDER — ALBUTEROL SULFATE HFA 108 (90 BASE) MCG/ACT IN AERS
8.0000 | INHALATION_SPRAY | Freq: Once | RESPIRATORY_TRACT | Status: AC
  Administered 2019-12-21: 8 via RESPIRATORY_TRACT
  Filled 2019-12-21: qty 6.7

## 2019-12-21 MED ORDER — IPRATROPIUM BROMIDE HFA 17 MCG/ACT IN AERS
4.0000 | INHALATION_SPRAY | Freq: Once | RESPIRATORY_TRACT | Status: AC
  Administered 2019-12-21: 4 via RESPIRATORY_TRACT
  Filled 2019-12-21: qty 12.9

## 2019-12-21 MED ORDER — AEROCHAMBER Z-STAT PLUS/MEDIUM MISC
Status: AC
  Filled 2019-12-21: qty 1

## 2019-12-21 MED ORDER — IPRATROPIUM BROMIDE HFA 17 MCG/ACT IN AERS
4.0000 | INHALATION_SPRAY | Freq: Once | RESPIRATORY_TRACT | Status: AC
  Administered 2019-12-21: 12:00:00 4 via RESPIRATORY_TRACT

## 2019-12-21 MED ORDER — AZITHROMYCIN 250 MG PO TABS: 250.0000 mg | ORAL_TABLET | Freq: Every day | ORAL | 0 refills | Status: DC

## 2019-12-21 MED ORDER — KETOROLAC TROMETHAMINE 60 MG/2ML IM SOLN
60.0000 mg | Freq: Once | INTRAMUSCULAR | Status: AC
  Administered 2019-12-21: 60 mg via INTRAMUSCULAR
  Filled 2019-12-21: qty 2

## 2019-12-21 MED ORDER — ALBUTEROL SULFATE HFA 108 (90 BASE) MCG/ACT IN AERS
8.0000 | INHALATION_SPRAY | Freq: Once | RESPIRATORY_TRACT | Status: AC
  Administered 2019-12-21: 8 via RESPIRATORY_TRACT

## 2019-12-21 MED ORDER — PREDNISONE 20 MG PO TABS: 40.0000 mg | ORAL_TABLET | Freq: Every day | ORAL | 0 refills | Status: DC

## 2019-12-21 MED ORDER — AEROCHAMBER PLUS FLO-VU MEDIUM MISC
1.0000 | Freq: Once | Status: AC
  Administered 2019-12-21: 12:00:00 1

## 2019-12-21 MED ORDER — PREDNISONE 20 MG PO TABS
60.0000 mg | ORAL_TABLET | Freq: Once | ORAL | Status: AC
  Administered 2019-12-21: 60 mg via ORAL
  Filled 2019-12-21: qty 3

## 2019-12-21 NOTE — ED Provider Notes (Signed)
[facility] DEPT Provider Note   CSN: [id number] Arrival date & time: 12/21/19  0930     History Chief Complaint  Patient presents with  . Shortness of Breath  . Chest Pain    Maria Sampson is a 29 y.o. female.  The history is provided by the patient.  Shortness of Breath Severity:  Moderate Duration:  3 days Timing:  Intermittent Progression:  Worsening Chronicity:  New Context: URI   Relieved by:  Nothing Worsened by:  Coughing and activity Associated symptoms: chest pain, cough, fever, headaches and wheezing   Associated symptoms comment:  Right ear pain and nasal congestion with intermittent sore throat Risk factors: no recent surgery and no tobacco use   Chest Pain Associated symptoms: cough, fever, headache and shortness of breath   URI Presenting symptoms: cough and fever   Associated symptoms: headaches and wheezing        Past Medical History:  Diagnosis Date  . Depression   . Otitis externa due to Pseudomonas aeruginosa 04/2014    Patient Active Problem List   Diagnosis Date Noted  . Constipation 02/26/2017  . MDD (major depressive disorder), recurrent severe, without psychosis ([place]) 02/26/2017  . Suicide attempt ([place]) 02/20/2017  . Suicide attempt by acetaminophen overdose ([place]) 02/20/2017  . Mild tetrahydrocannabinol (THC) abuse 02/20/2017  . BPV (benign positional vertigo) 10/27/2015  . Malnutrition of moderate degree 10/26/2015  . Abdominal pain 10/23/2015  . Nausea & vomiting 10/23/2015  . Sepsis ([place]) 10/23/2015  . Fibroid, uterine 10/23/2015  . Ileus ([place]) 10/23/2015  . Normocytic anemia 10/23/2015  . Overdose 06/03/2014  . Depressive disorder 06/03/2014  . Suicidal ideation 06/03/2014  . Otitis externa due to Pseudomonas aeruginosa 05/03/2014    Past Surgical History:  Procedure Laterality Date  . DENTAL EXAMINATION UNDER ANESTHESIA       OB History   No obstetric history on file.     Family  History  Problem Relation Age of Onset  . Crohn's disease Other   . Crohn's disease Mother     Social History   Tobacco Use  . Smoking status: Former Smoker    Types: Cigars  . Smokeless tobacco: Never Used  . Tobacco comment: 2 per day  Substance Use Topics  . Alcohol use: Yes    Comment: Social  . Drug use: Not Currently    Types: Marijuana    Home Medications Prior to Admission medications   Medication Sig Start Date End Date Taking? Authorizing Provider  acetaminophen (TYLENOL) 500 MG tablet Take 1,000 mg by mouth every 6 (six) hours as needed.   Yes [provider]  Pheniramine-PE-APAP (THERAFLU COLD & SORE THROAT) 20-10-325 MG PACK Take 1 Package by mouth daily as needed (Flu symptons).   Yes [provider]  benzonatate (TESSALON PERLES) 100 MG capsule Take 1 capsule (100 mg total) by mouth 3 (three) times daily as needed. Patient not taking: Reported on 12/21/2019 11/04/19   Chevis Pretty, FNP  naproxen (NAPROSYN) 500 MG tablet Take 1 tablet (500 mg total) by mouth 2 (two) times daily. Patient not taking: Reported on 12/21/2019 10/28/19   Petrucelli, Samantha R, PA-C  ondansetron (ZOFRAN ODT) 8 MG disintegrating tablet Take 1 tablet (8 mg total) by mouth every 8 (eight) hours as needed for nausea. Patient not taking: Reported on 12/21/2019 10/12/19   Varney Biles, MD  oseltamivir (TAMIFLU) 75 MG capsule Take 1 capsule (75 mg total) by mouth every 12 (twelve) hours. Patient not  taking: Reported on 12/21/2019 11/10/19   Montine Circle, PA-C    Allergies    Peanut-containing drug products  Review of Systems   Review of Systems  Constitutional: Positive for fever.  Respiratory: Positive for cough, shortness of breath and wheezing.   Cardiovascular: Positive for chest pain.  Neurological: Positive for headaches.  All other systems reviewed and are negative.   Physical Exam Updated Vital Signs BP (!) 181/107   Pulse 83   Temp 99.3 F (37.4  C) (Oral)   Resp (!) 25   Ht 5\' 10"  (1.778 m)   Wt 88.5 kg   LMP 12/07/2019 (Approximate)   SpO2 97%   BMI 27.98 kg/m   Physical Exam Vitals and nursing note reviewed.  Constitutional:      General: She is not in acute distress.    Appearance: She is well-developed and normal weight.  HENT:     Head: Normocephalic and atraumatic.     Right Ear: A middle ear effusion is present.     Left Ear: A middle ear effusion is present.     Nose: Mucosal edema present.     Mouth/Throat:     Pharynx: Posterior oropharyngeal erythema present. No pharyngeal swelling, oropharyngeal exudate or uvula swelling.  Eyes:     Pupils: Pupils are equal, round, and reactive to light.  Cardiovascular:     Rate and Rhythm: Normal rate and regular rhythm.     Heart sounds: Normal heart sounds. No murmur. No friction rub.  Pulmonary:     Effort: Pulmonary effort is normal. Tachypnea present.     Breath sounds: Wheezing present. No rales.  Abdominal:     General: Bowel sounds are normal. There is no distension.     Palpations: Abdomen is soft.     Tenderness: There is no abdominal tenderness. There is no guarding or rebound.  Musculoskeletal:        General: No tenderness. Normal range of motion.     Comments: No edema  Skin:    General: Skin is warm and dry.     Findings: No rash.  Neurological:     Mental Status: She is alert and oriented to person, place, and time.     Cranial Nerves: No cranial nerve deficit.  Psychiatric:        Behavior: Behavior normal.     ED Results / Procedures / Treatments   Labs (all labs ordered are listed, but only abnormal results are displayed) Labs Reviewed  POC SARS CORONAVIRUS 2 AG -  ED    EKG EKG Interpretation  Date/Time:  Monday December 21 2019 09:43:27 EST Ventricular Rate:  90 PR Interval:    QRS Duration: 82 QT Interval:  352 QTC Calculation: 431 R Axis:   86 Text Interpretation: Sinus rhythm Baseline wander in lead(s) I II aVR No  significant change since last tracing Confirmed by Blanchie Dessert [phone]) on 12/21/2019 10:05:25 AM   Radiology DG Chest Port 1 View  Result Date: 12/21/2019 CLINICAL DATA:  Shortness of breath, cough, and fever EXAM: PORTABLE CHEST 1 VIEW COMPARISON:  10/30/2018 FINDINGS: Haziness of the bilateral lower chest likely from soft tissue attenuation. There is no edema, air bronchograms, effusion, or pneumothorax. No pneumothorax IMPRESSION: Haziness of the lower chest is likely from soft tissue overlap. No acute disease is suspected. Electronically Signed   By: Monte Fantasia M.D.   On: 12/21/2019 10:42    Procedures Procedures (including critical care time)  Medications Ordered in ED Medications  predniSONE (DELTASONE) tablet 60 mg (60 mg Oral Given 12/21/19 1036)  albuterol (VENTOLIN HFA) 108 (90 Base) MCG/ACT inhaler 8 puff (8 puffs Inhalation Given 12/21/19 1036)  ipratropium (ATROVENT HFA) inhaler 4 puff (4 puffs Inhalation Given 12/21/19 1036)    ED Course  I have reviewed the triage vital signs and the nursing notes.  Pertinent labs & imaging results that were available during my care of the patient were reviewed by me and considered in my medical decision making (see chart for details).    MDM Rules/Calculators/A&P                      29 year old healthy female presenting today with URI symptoms for the last 3 days and symptoms suggestive of bronchitis.  Patient is diffusely wheezing and mildly tachypneic.  She reports low-grade temperatures.  No prior history of asthma and she had been a smoker but had to stop smoking 7 months ago.  Patient has been tested for Covid in the past and was negative.  Partner tested positive in December but she was negative at that time.  Patient is having feelings of shortness of breath but oxygen saturation is 97 to 100% on room air.  Will treat with prednisone, albuterol and Atrovent.  Chest x-ray pending.  EKG without acute findings.  Low suspicion at  this time for cardiac cause or PE.  Feel this is viral in nature.  11:54 AM Xray wnl.  COVID neg.  On repeat exam more pronounced wheezing and will redose inhalers.  1:10 PM Wheezing has improved and still has expiratory wheezing on the right side.  She was given a third round of albuterol and Atrovent.  1:53 PM Wheezing resolved and pt feeling better and d/ced home.  Final Clinical Impression(s) / ED Diagnoses Final diagnoses:  Bronchitis    Rx / DC Orders ED Discharge Orders         Ordered    predniSONE (DELTASONE) 20 MG tablet  Daily     12/21/19 1353    azithromycin (ZITHROMAX Z-PAK) 250 MG tablet  Daily     12/21/19 1353           Blanchie Dessert, MD 12/21/19 1354

## 2019-12-21 NOTE — Discharge Instructions (Signed)
Use the albuterol inhaler 2 puffs every 4-6 hours as needed for wheezing and tightness.  You can use the Atrovent inhaler 2 puffs twice a day at the most.

## 2019-12-21 NOTE — ED Triage Notes (Addendum)
Patient c/o SOB, a non productive cough, fever, and chest tightness x 3 days.

## 2019-12-24 ENCOUNTER — Telehealth: Payer: Medicaid Other | Admitting: Physician Assistant

## 2019-12-24 DIAGNOSIS — J209 Acute bronchitis, unspecified: Secondary | ICD-10-CM

## 2019-12-24 NOTE — Progress Notes (Signed)
Based on what you shared with me, I feel your condition warrants further evaluation and I recommend that you be seen for a face to face office visit.   NOTE: If you entered your credit card information for this eVisit, you will not be charged. You may see a "hold" on your card for the $35 but that hold will drop off and you will not have a charge processed.   If you are having a true medical emergency please call 911.      For an urgent face to face visit, [facility] has five urgent care centers for your convenience:      NEW:  [facility] [place] [facility] [place] Get Driving Directions [id number] [address] [place], [place] [address] . 10 am - 6pm Monday - Friday    [facility]) Get Driving Directions [id number] [address] [place], [place] [address] . 10 am to 8 pm Monday-Friday . 12 pm to 8 pm [facility] at [facility] Get Driving Directions [id number] [place], [place], [place] [address] . 8 am to 8 pm Monday-Friday . 9 am to 6 pm Saturday . 11 am to 6 pm Sunday     [facility] at [facility] Get Driving Directions  [id number] [address] [place], [place] [address] . 8 am to 8 pm Monday-Friday . 8 am to 4 pm [facility] at [place] Get Driving Directions [id number] [place], [place] [address] . 12 pm to 6 pm Monday-Friday      Your e-visit answers were reviewed by a board certified advanced clinical practitioner to complete your personal care plan.  Thank you for using e-Visits.    Particia Nearing PA-C  Approximately 5 minutes was spent documenting and reviewing patient's chart.

## 2020-03-12 ENCOUNTER — Ambulatory Visit (INDEPENDENT_AMBULATORY_CARE_PROVIDER_SITE_OTHER)
Admission: RE | Admit: 2020-03-12 | Discharge: 2020-03-12 | Disposition: A | Discharge status: Home / Self Care | Payer: Medicaid Other | Source: Ambulatory Visit

## 2020-03-12 DIAGNOSIS — M5410 Radiculopathy, site unspecified: Secondary | ICD-10-CM

## 2020-03-12 DIAGNOSIS — S39012A Strain of muscle, fascia and tendon of lower back, initial encounter: Secondary | ICD-10-CM

## 2020-03-12 DIAGNOSIS — X500XXA Overexertion from strenuous movement or load, initial encounter: Secondary | ICD-10-CM

## 2020-03-12 DIAGNOSIS — M549 Dorsalgia, unspecified: Secondary | ICD-10-CM

## 2020-03-12 MED ORDER — TIZANIDINE HCL 4 MG PO TABS: 4.0000 mg | ORAL_TABLET | Freq: Three times a day (TID) | ORAL | 0 refills | Status: DC | PRN

## 2020-03-12 MED ORDER — MELOXICAM 15 MG PO TABS: 15.0000 mg | ORAL_TABLET | Freq: Every day | ORAL | 1 refills | Status: DC

## 2020-03-12 NOTE — Discharge Instructions (Signed)
Hydrate well with at least 2 liters (64 ounces) of water daily.  

## 2020-03-12 NOTE — ED Provider Notes (Signed)
Virtual Visit via Video Note:  [name]  initiated request for Telemedicine visit with [facility] team. I connected with [name]  on 03/12/2020 at 1:31 PM  for a synchronized telemedicine visit using a video enabled HIPPA compliant telemedicine application. I verified that I am speaking with [name]  using two identifiers. Jaynee Eagles, PA-C  was physically located in a [facility] Urgent care site and AKEYLA MUCHNIK was located at a different location.   The limitations of evaluation and management by telemedicine as well as the availability of in-person appointments were discussed. Patient was informed that she  may incur a bill ( including co-pay) for this virtual visit encounter. [name]  expressed understanding and gave verbal consent to proceed with virtual visit.     History of Present Illness:Maria Sampson  is a 29 y.o. female presents with 1 day hx of acute onset neck stiffness, left upper back swelling. Has pain on right side of neck that can radiate to either arm. Feels pulling sensation of her back when she flexes her neck. Cannot identify any inciting factors, denies trauma, falls, recent mva. Works in [employer], does a lot of heavy lifting, has fast paced work. Has tried ibuprofen with minimal relief. Does not hydrate well. Denies loss of ROM, weakness.   ROS  No current facility-administered medications for this encounter.   Current Outpatient Medications  Medication Sig Dispense Refill  . acetaminophen (TYLENOL) 500 MG tablet Take 1,000 mg by mouth every 6 (six) hours as needed.    Marland Kitchen azithromycin (ZITHROMAX Z-PAK) 250 MG tablet Take 1 tablet (250 mg total) by mouth daily. Take 2 tablets on day 1 a total of 500 mg by mouth and then take 1 tablet daily 250 mg by mouth for the next 4 days. 6 tablet 0  . benzonatate (TESSALON PERLES) 100 MG capsule Take 1 capsule (100 mg total) by mouth 3 (three) times daily as needed. (Patient not  taking: Reported on 12/21/2019) 20 capsule 0  . naproxen (NAPROSYN) 500 MG tablet Take 1 tablet (500 mg total) by mouth 2 (two) times daily. (Patient not taking: Reported on 12/21/2019) 10 tablet 0  . ondansetron (ZOFRAN ODT) 8 MG disintegrating tablet Take 1 tablet (8 mg total) by mouth every 8 (eight) hours as needed for nausea. (Patient not taking: Reported on 12/21/2019) 20 tablet 0  . oseltamivir (TAMIFLU) 75 MG capsule Take 1 capsule (75 mg total) by mouth every 12 (twelve) hours. (Patient not taking: Reported on 12/21/2019) 10 capsule 0  . Pheniramine-PE-APAP (THERAFLU COLD & SORE THROAT) 20-10-325 MG PACK Take 1 Package by mouth daily as needed (Flu symptons).    . predniSONE (DELTASONE) 20 MG tablet Take 2 tablets (40 mg total) by mouth daily. 10 tablet 0     Allergies  Allergen Reactions  . Peanut-Containing Drug Products Anaphylaxis and Hives     Past Medical History:  Diagnosis Date  . Depression   . Otitis externa due to Pseudomonas aeruginosa 04/2014    Past Surgical History:  Procedure Laterality Date  . DENTAL EXAMINATION UNDER ANESTHESIA        Observations/Objective: Physical Exam Constitutional:      General: She is not in acute distress.    Appearance: Normal appearance. She is well-developed. She is not ill-appearing, toxic-appearing or diaphoretic.  Eyes:     Extraocular Movements: Extraocular movements intact.  Pulmonary:     Effort: Pulmonary effort is normal.  Neurological:     General: No focal deficit present.     Mental Status: She is alert and oriented to person, place, and time.  Psychiatric:        Mood and Affect: Mood normal.        Behavior: Behavior normal.        Thought Content: Thought content normal.        Judgment: Judgment normal.      Assessment and Plan:  PDMP not reviewed this encounter.  1. Upper back pain   2. Radicular pain   3. Back strain, initial encounter     Recommended conservative management for back strain given  nature of her work and lack of hydration.  Start meloxicam, tizanidine.  Strict ER precautions. Counseled patient on potential for adverse effects with medications prescribed/recommended today, ER and return-to-clinic precautions discussed, patient verbalized understanding.   Follow Up Instructions:    I discussed the assessment and treatment plan with the patient. The patient was provided an opportunity to ask questions and all were answered. The patient agreed with the plan and demonstrated an understanding of the instructions.   The patient was advised to call back or seek an in-person evaluation if the symptoms worsen or if the condition fails to improve as anticipated.  I provided 10 minutes of non-face-to-face time during this encounter.    Jaynee Eagles, PA-C  03/12/2020 1:31 PM         Jaynee Eagles, PA-C 03/12/20 1418

## 2020-03-15 ENCOUNTER — Ambulatory Visit (HOSPITAL_COMMUNITY)
Admission: EM | Admit: 2020-03-15 | Discharge: 2020-03-15 | Disposition: A | Discharge status: Home / Self Care | Payer: Self-pay | Attending: Family Medicine | Admitting: Family Medicine

## 2020-03-15 ENCOUNTER — Other Ambulatory Visit: Payer: Self-pay

## 2020-03-15 ENCOUNTER — Encounter (HOSPITAL_COMMUNITY): Payer: Self-pay

## 2020-03-15 ENCOUNTER — Ambulatory Visit (INDEPENDENT_AMBULATORY_CARE_PROVIDER_SITE_OTHER): Payer: Self-pay

## 2020-03-15 DIAGNOSIS — M5412 Radiculopathy, cervical region: Secondary | ICD-10-CM

## 2020-03-15 MED ORDER — CYCLOBENZAPRINE HCL 10 MG PO TABS: 10.0000 mg | ORAL_TABLET | Freq: Two times a day (BID) | ORAL | 0 refills | Status: DC | PRN

## 2020-03-15 MED ORDER — KETOROLAC TROMETHAMINE 30 MG/ML IJ SOLN
INTRAMUSCULAR | Status: AC
  Filled 2020-03-15: qty 1

## 2020-03-15 MED ORDER — TRAMADOL HCL 50 MG PO TABS: 50.0000 mg | ORAL_TABLET | Freq: Two times a day (BID) | ORAL | 0 refills | Status: DC | PRN

## 2020-03-15 MED ORDER — KETOROLAC TROMETHAMINE 30 MG/ML IJ SOLN
30.0000 mg | Freq: Once | INTRAMUSCULAR | Status: AC
  Administered 2020-03-15: 13:00:00 30 mg via INTRAMUSCULAR

## 2020-03-15 MED ORDER — PREDNISONE 10 MG (21) PO TBPK: ORAL_TABLET | ORAL | 0 refills | Status: DC

## 2020-03-15 NOTE — Discharge Instructions (Addendum)
Nothing concerning on x ray  You do have some bone spurring in the cervical spine which may be causing your symptoms We will try treating this with prednisone taper over the next 6 days.  Take this preferably early in the day with food. Toradol given here for pain Switching to Flexeril for muscle relaxant to use as needed and tramadol for severe pain as needed.  Both of these medications may make you drowsy so be aware this. Follow up as needed for continued or worsening symptoms

## 2020-03-15 NOTE — ED Triage Notes (Signed)
Patient had video visit on 4/24. Prescribed meloxicam and tizanidine. Reports that she takes one each night before bed, and feels she is having a reaction to them. Reports it starts with abdominal pain, then tingling in the arms and hands. Abdominal pain worsens and pain goes down her back. Reports that the symptoms go away until she takes the medicine again.

## 2020-03-16 NOTE — ED Provider Notes (Signed)
[facility]    CSN: [id number] Arrival date & time: 03/15/20  1049      History   Chief Complaint Chief Complaint  Patient presents with  . f/u neck pain    HPI Maria Sampson is a 29 y.o. female.   Patient is a 29 year old female that presents today for continued neck pain.  She was seen by video on 4/24 and has been taking meloxicam and tizanidine for muscle strain/spasm without much relief.  Reporting that she takes the medication at bedtime and then wakes up with worsening neck pain, numbness and tingling radiating down bilateral arms.  Reporting this is starting to scare her.  She is not sure if it is a reaction to the medication or worsening neck problems.  Denies any falls or neck injuries.  Denies any fevers.  ROS per HPI      Past Medical History:  Diagnosis Date  . Depression   . Otitis externa due to Pseudomonas aeruginosa 04/2014    Patient Active Problem List   Diagnosis Date Noted  . Constipation 02/26/2017  . MDD (major depressive disorder), recurrent severe, without psychosis ([place]) 02/26/2017  . Suicide attempt ([place]) 02/20/2017  . Suicide attempt by acetaminophen overdose ([place]) 02/20/2017  . Mild tetrahydrocannabinol (THC) abuse 02/20/2017  . BPV (benign positional vertigo) 10/27/2015  . Malnutrition of moderate degree 10/26/2015  . Abdominal pain 10/23/2015  . Nausea & vomiting 10/23/2015  . Sepsis ([place]) 10/23/2015  . Fibroid, uterine 10/23/2015  . Ileus ([place]) 10/23/2015  . Normocytic anemia 10/23/2015  . Overdose 06/03/2014  . Depressive disorder 06/03/2014  . Suicidal ideation 06/03/2014  . Otitis externa due to Pseudomonas aeruginosa 05/03/2014    Past Surgical History:  Procedure Laterality Date  . DENTAL EXAMINATION UNDER ANESTHESIA      OB History   No obstetric history on file.      Home Medications    Prior to Admission medications   Medication Sig Start Date End Date Taking? Authorizing Provider  acetaminophen  (TYLENOL) 500 MG tablet Take 1,000 mg by mouth every 6 (six) hours as needed.    [provider]  cyclobenzaprine (FLEXERIL) 10 MG tablet Take 1 tablet (10 mg total) by mouth 2 (two) times daily as needed for muscle spasms. 03/15/20   Isaac Dubie, Tressia Miners A, NP  Pheniramine-PE-APAP (THERAFLU COLD & SORE THROAT) 20-10-325 MG PACK Take 1 Package by mouth daily as needed (Flu symptons).    [provider]  predniSONE (STERAPRED UNI-PAK 21 TAB) 10 MG (21) TBPK tablet 6 tabs for 1 day, then 5 tabs for 1 das, then 4 tabs for 1 day, then 3 tabs for 1 day, 2 tabs for 1 day, then 1 tab for 1 day 03/15/20   Loura Halt A, NP  traMADol (ULTRAM) 50 MG tablet Take 1 tablet (50 mg total) by mouth every 12 (twelve) hours as needed. 03/15/20   Orvan July, NP    Family History Family History  Problem Relation Age of Onset  . Crohn's disease Other   . Crohn's disease Mother     Social History Social History   Tobacco Use  . Smoking status: Former Smoker    Types: Cigars  . Smokeless tobacco: Never Used  . Tobacco comment: 2 per day  Substance Use Topics  . Alcohol use: Yes    Comment: Social  . Drug use: Not Currently    Types: Marijuana     Allergies   Peanut-containing drug products  Review of Systems Review of Systems   Physical Exam Triage Vital Signs ED Triage Vitals  Enc Vitals Group     BP 03/15/20 1127 138/90     Pulse Rate 03/15/20 1127 77     Resp 03/15/20 1127 16     Temp 03/15/20 1127 98.3 F (36.8 C)     Temp Source 03/15/20 1127 Oral     SpO2 03/15/20 1127 99 %     Weight --      Height --      Head Circumference --      Peak Flow --      Pain Score 03/15/20 1123 10     Pain Loc --      Pain Edu? --      Excl. in [place]? --    No data found.  Updated Vital Signs BP 138/90 (BP Location: Right Arm)   Pulse 77   Temp 98.3 F (36.8 C) (Oral)   Resp 16   LMP 03/01/2020 (Within Days)   SpO2 99%   Visual Acuity Right Eye Distance:   Left Eye  Distance:   Bilateral Distance:    Right Eye Near:   Left Eye Near:    Bilateral Near:     Physical Exam Vitals and nursing note reviewed.  Constitutional:      General: She is not in acute distress.    Appearance: Normal appearance. She is not ill-appearing, toxic-appearing or diaphoretic.     Comments: Tearful   HENT:     Head: Normocephalic.     Nose: Nose normal.  Eyes:     Conjunctiva/sclera: Conjunctivae normal.  Pulmonary:     Effort: Pulmonary effort is normal.  Abdominal:     Palpations: Abdomen is soft.     Tenderness: There is no abdominal tenderness.  Musculoskeletal:        General: No swelling or tenderness. Normal range of motion.     Cervical back: Normal range of motion.     Comments: No specific neck midline or musculature tenderness No swelling  Good range of motion of neck and bilateral upper arms  Skin:    General: Skin is warm and dry.     Findings: No rash.  Neurological:     Mental Status: She is alert.  Psychiatric:        Mood and Affect: Mood normal.      UC Treatments / Results  Labs (all labs ordered are listed, but only abnormal results are displayed) Labs Reviewed - No data to display  EKG   Radiology DG Cervical Spine Complete  Result Date: 03/15/2020 CLINICAL DATA:  Severe neck pain. No known injury. EXAM: CERVICAL SPINE - COMPLETE 4+ VIEW COMPARISON:  None. FINDINGS: Osseous alignment is within normal limits. No acute or suspicious osseous finding. No fracture line or displaced fracture fragment. Mild degenerative spurring within the upper cervical spine. No evidence of advanced degenerative disc disease at any level. Prevertebral soft tissues are normal in thickness. IMPRESSION: 1. No acute findings. 2. Mild degenerative spurring within the upper cervical spine. Electronically Signed   By: Franki Cabot M.D.   On: 03/15/2020 12:29    Procedures Procedures (including critical care time)  Medications Ordered in UC Medications   ketorolac (TORADOL) 30 MG/ML injection 30 mg (30 mg Intramuscular Given 03/15/20 1240)    Initial Impression / Assessment and Plan / UC Course  I have reviewed the triage vital signs and the nursing notes.  Pertinent labs &  imaging results that were available during my care of the patient were reviewed by me and considered in my medical decision making (see chart for details).     Cervical radiculopathy Nothing concerning or acute on cervical x-ray.  She does have some bone spurring in the cervical spine.  We will try treating her radiculopathy with prednisone over the next 6 days.  Toradol given here for acute pain Switching to Flexeril for muscle relaxant to see if she gets more relief from this. Tramadol as needed for severe pain Return precautions given Final Clinical Impressions(s) / UC Diagnoses   Final diagnoses:  Cervical radiculopathy, acute     Discharge Instructions     Nothing concerning on x ray  You do have some bone spurring in the cervical spine which may be causing your symptoms We will try treating this with prednisone taper over the next 6 days.  Take this preferably early in the day with food. Toradol given here for pain Switching to Flexeril for muscle relaxant to use as needed and tramadol for severe pain as needed.  Both of these medications may make you drowsy so be aware this. Follow up as needed for continued or worsening symptoms     ED Prescriptions    Medication Sig Dispense Auth. Provider   predniSONE (STERAPRED UNI-PAK 21 TAB) 10 MG (21) TBPK tablet 6 tabs for 1 day, then 5 tabs for 1 das, then 4 tabs for 1 day, then 3 tabs for 1 day, 2 tabs for 1 day, then 1 tab for 1 day 21 tablet Serapio Edelson A, NP   cyclobenzaprine (FLEXERIL) 10 MG tablet Take 1 tablet (10 mg total) by mouth 2 (two) times daily as needed for muscle spasms. 20 tablet Kyriaki Moder A, NP   traMADol (ULTRAM) 50 MG tablet Take 1 tablet (50 mg total) by mouth every 12 (twelve) hours  as needed. 12 tablet Schylar Allard A, NP     I have reviewed the PDMP during this encounter.   Orvan July, NP 03/16/20 1413

## 2020-04-14 ENCOUNTER — Encounter (HOSPITAL_COMMUNITY): Payer: Self-pay

## 2020-04-14 ENCOUNTER — Other Ambulatory Visit: Payer: Self-pay

## 2020-04-14 ENCOUNTER — Emergency Department (HOSPITAL_COMMUNITY)
Admission: EM | Admit: 2020-04-14 | Discharge: 2020-04-14 | Disposition: A | Discharge status: Home / Self Care | Payer: Medicaid Other | Attending: Emergency Medicine | Admitting: Emergency Medicine

## 2020-04-14 DIAGNOSIS — R1031 Right lower quadrant pain: Secondary | ICD-10-CM | POA: Insufficient documentation

## 2020-04-14 DIAGNOSIS — Z5321 Procedure and treatment not carried out due to patient leaving prior to being seen by health care provider: Secondary | ICD-10-CM | POA: Insufficient documentation

## 2020-04-14 NOTE — ED Notes (Signed)
Pt called 3X for room placement. No answer.

## 2020-04-14 NOTE — ED Triage Notes (Signed)
Right flank pain for 2 days.

## 2020-05-01 ENCOUNTER — Telehealth: Payer: Medicaid Other | Admitting: Physician Assistant

## 2020-05-01 DIAGNOSIS — H6090 Unspecified otitis externa, unspecified ear: Secondary | ICD-10-CM

## 2020-05-01 MED ORDER — CIPRO HC 0.2-1 % OT SUSP: 3.0000 [drp] | Freq: Two times a day (BID) | OTIC | 0 refills | Status: AC

## 2020-05-01 NOTE — Progress Notes (Signed)
E Visit for Swimmer's Ear ° °We are sorry that you are not feeling well. Here is how we plan to help! ° °Based on what you have shared with me it looks like you have swimmers ear. Swimmer's ear is a redness or swelling, irritation, or infection of your outer ear canal.  These symptoms usually occur within a few days of swimming.  Your ear canal is a tube that goes from the opening of the ear to the eardrum.  When water stays in your ear canal, germs can grow.  This is a painful condition that often happens to children and swimmers of all ages.  It is not contagious and oral antibiotics are not required to treat uncomplicated swimmer's ear.  The usual symptoms include: Itching inside the ear, Redness or a sense of swelling in the ear, Pain when the ear is tugged on when pressure is placed on the ear, Pus draining from the infected ear. and I have prescribed: Ciprofloxin 0.2% and hydrocortisone 1% otic suspension 3 drops in affected ears twice daily for 7 days ° ° °In certain cases swimmer's ear may progress to a more serious bacterial infection of the middle or inner ear.  If you have a fever 102 and up and significantly worsening symptoms, this could indicate a more serious infection moving to the middle/inner and needs face to face evaluation in an office by a provider. ° °Your symptoms should improve over the next 3 days and should resolve in about 7 days. ° °HOME CARE: ° °· Wash your hands frequently. °· Do not place the tip of the bottle on your ear or touch it with your fingers. °· You can take Acetominophen 650 mg every 4-6 hours as needed for pain.  If pain is severe or moderate, you can apply a heating pad (set on low) or hot water bottle (wrapped in a towel) to outer ear for 20 minutes.  This will also increase drainage. °· Avoid ear plugs °· Do not use Q-tips °· After showers, help the water run out by tilting your head to one side. ° °GET HELP RIGHT AWAY IF: ° °· Fever is over 102.2 degrees. °· You  develop progressive ear pain or hearing loss. °· Ear symptoms persist longer than 3 days after treatment. ° °MAKE SURE YOU: ° °· Understand these instructions. °· Will watch your condition. °· Will get help right away if you are not doing well or get worse. ° °TO PREVENT SWIMMER'S EAR: °· Use a bathing cap or custom fitted swim molds to keep your ears dry. °· Towel off after swimming to dry your ears. °· Tilt your head or pull your earlobes to allow the water to escape your ear canal. °· If there is still water in your ears, consider using a hairdryer on the lowest setting. ° °Thank you for choosing an e-visit. °Your e-visit answers were reviewed by a board certified advanced clinical practitioner to complete your personal care plan. Depending upon the condition, your plan could have included both over the counter or prescription medications. °Please review your pharmacy choice. Be sure that the pharmacy you have chosen is open so that you can pick up your prescription now.  If there is a problem you may message your provider in MyChart to have the prescription routed to another pharmacy. °Your safety is important to us. If you have drug allergies check your prescription carefully.  °For the next 24 hours, you can use MyChart to ask questions about today’s   visit, request a non-urgent call back, or ask for a work or school excuse from your e-visit provider. °You will get an email in the next two days asking about your experience. I hope that your e-visit has been valuable and will speed your recovery. ° ° ° °Greater than 5 minutes, yet less than 10 minutes of time have been spent researching, coordinating and implementing care for this patient today.  ° ° °

## 2020-05-05 ENCOUNTER — Telehealth: Payer: Medicaid Other | Admitting: Family

## 2020-05-05 ENCOUNTER — Telehealth: Payer: Medicaid Other

## 2020-05-05 ENCOUNTER — Inpatient Hospital Stay
Admission: RE | Admit: 2020-05-05 | Discharge: 2020-05-05 | Disposition: A | Discharge status: Home / Self Care | Payer: Medicaid Other | Source: Ambulatory Visit

## 2020-05-05 ENCOUNTER — Telehealth: Payer: Medicaid Other | Admitting: Emergency Medicine

## 2020-05-05 DIAGNOSIS — H9209 Otalgia, unspecified ear: Secondary | ICD-10-CM

## 2020-05-05 NOTE — Progress Notes (Signed)
Based on what you shared with me, I feel your condition warrants further evaluation and I recommend that you be seen for a face to face office visit.  We need to look at your ear to see if it is infected or something else.    NOTE: If you entered your credit card information for this eVisit, you will not be charged. You may see a "hold" on your card for the $35 but that hold will drop off and you will not have a charge processed.   If you are having a true medical emergency please call 911.      For an urgent face to face visit, [facility] has five urgent care centers for your convenience:      NEW:  [facility] [place] [facility] [place] Get Driving Directions [phone] [address] [place], [place] [address] . 10 am - 6pm Monday - Friday    [facility]) Get Driving Directions [phone] [address] [place], [place] [address] . 10 am to 8 pm Monday-Friday . 12 pm to 8 pm [facility] at [facility] Get Driving Directions [phone] [address], [place], [place] [address] . 8 am to 8 pm Monday-Friday . 9 am to 6 pm Saturday . 11 am to 6 pm Sunday     [facility] at [facility] Get Driving Directions  [phone] [address], [place] [address] . 8 am to 8 pm Monday-Friday . 8 am to 4 pm [facility] [place] Get Driving Directions [phone] [address] [place], [place] [address] . 12 pm to 6 pm Monday-Friday      Your e-visit answers were reviewed by a board certified advanced clinical practitioner to complete your personal care plan.  Thank you for using e-Visits.

## 2020-05-05 NOTE — Progress Notes (Signed)
Based on what you shared with me, I feel your condition warrants further evaluation and I recommend that you be seen for a face to face visit.  Please contact your primary care physician practice to be seen. Many offices offer virtual options to be seen via video if you are not comfortable going in person to a medical facility at this time.  If you do not have a PCP, [facility] offers a free physician referral service available at [phone]. Our trained staff has the experience, knowledge and resources to put you in touch with a physician who is right for you.   You also have the option of a video visit through [email or web address]  If you are having a true medical emergency please call 911.  NOTE: If you entered your credit card information for this eVisit, you will not be charged. You may see a "hold" on your card for the $35 but that hold will drop off and you will not have a charge processed.  Your e-visit answers were reviewed by a board certified advanced clinical practitioner to complete your personal care plan.  Thank you for using e-Visits.  I spent 10 minute reviewing patient's chart.

## 2020-05-31 ENCOUNTER — Other Ambulatory Visit: Payer: Self-pay

## 2020-05-31 ENCOUNTER — Encounter (HOSPITAL_COMMUNITY): Payer: Self-pay

## 2020-05-31 ENCOUNTER — Emergency Department (HOSPITAL_COMMUNITY)
Admission: EM | Admit: 2020-05-31 | Discharge: 2020-05-31 | Disposition: A | Discharge status: Home / Self Care | Payer: Medicaid Other | Attending: Emergency Medicine | Admitting: Emergency Medicine

## 2020-05-31 DIAGNOSIS — R5383 Other fatigue: Secondary | ICD-10-CM | POA: Insufficient documentation

## 2020-05-31 DIAGNOSIS — R05 Cough: Secondary | ICD-10-CM | POA: Insufficient documentation

## 2020-05-31 DIAGNOSIS — Z5321 Procedure and treatment not carried out due to patient leaving prior to being seen by health care provider: Secondary | ICD-10-CM | POA: Insufficient documentation

## 2020-05-31 DIAGNOSIS — J3489 Other specified disorders of nose and nasal sinuses: Secondary | ICD-10-CM | POA: Insufficient documentation

## 2020-05-31 NOTE — ED Notes (Signed)
Patient states she doesn't feel good and does not want to have to wait to be seen so will not stay. Patient encourage to be seen by a provider but declines.

## 2020-05-31 NOTE — ED Triage Notes (Signed)
Patient arrived stating on Tuesday she received the first dose of the Covid-19 vaccine and now having a runny nose, cough, and fatigue. Last dose of Ibuprofen Sunday, states she stopped taking it because it doesn't do any good.

## 2020-06-01 ENCOUNTER — Emergency Department (HOSPITAL_COMMUNITY)
Admission: EM | Admit: 2020-06-01 | Discharge: 2020-06-01 | Disposition: A | Discharge status: Home / Self Care | Payer: BC Managed Care – PPO | Attending: Emergency Medicine | Admitting: Emergency Medicine

## 2020-06-01 ENCOUNTER — Emergency Department (HOSPITAL_COMMUNITY): Payer: BC Managed Care – PPO

## 2020-06-01 ENCOUNTER — Encounter (HOSPITAL_COMMUNITY): Payer: Self-pay

## 2020-06-01 DIAGNOSIS — R0602 Shortness of breath: Secondary | ICD-10-CM | POA: Diagnosis not present

## 2020-06-01 DIAGNOSIS — R5383 Other fatigue: Secondary | ICD-10-CM | POA: Diagnosis present

## 2020-06-01 DIAGNOSIS — Z5321 Procedure and treatment not carried out due to patient leaving prior to being seen by health care provider: Secondary | ICD-10-CM | POA: Diagnosis not present

## 2020-06-01 LAB — BASIC METABOLIC PANEL
Anion gap: 11 (ref 5–15)
BUN: 8 mg/dL (ref 6–20)
CO2: 22 mmol/L (ref 22–32)
Calcium: 9.6 mg/dL (ref 8.9–10.3)
Chloride: 104 mmol/L (ref 98–111)
Creatinine, Ser: 0.78 mg/dL (ref 0.44–1.00)
GFR calc Af Amer: >60 mL/min (ref >60)
GFR calc non Af Amer: >60 mL/min (ref >60)
Glucose, Bld: 119 mg/dL — ABNORMAL HIGH (ref 70–99)
Potassium: 3.7 mmol/L (ref 3.5–5.1)
Sodium: 137 mmol/L (ref 135–145)

## 2020-06-01 LAB — CBC
HCT: 38 % (ref 36.0–46.0)
Hemoglobin: 12.6 g/dL (ref 12.0–15.0)
MCH: 30.9 pg (ref 26.0–34.0)
MCHC: 33.2 g/dL (ref 30.0–36.0)
MCV: 93.1 fL (ref 80.0–100.0)
Platelets: 307 10*3/uL (ref 150–400)
RBC: 4.08 MIL/uL (ref 3.87–5.11)
RDW: 14.1 % (ref 11.5–15.5)
WBC: 8.6 10*3/uL (ref 4.0–10.5)
nRBC: 0 % (ref 0.0–0.2)

## 2020-06-01 LAB — I-STAT BETA HCG BLOOD, ED (MC, WL, AP ONLY): I-stat hCG, quantitative: <5 m[IU]/mL (ref <5)

## 2020-06-01 NOTE — ED Notes (Signed)
Pt requested to see charge nurse, Octavia Bruckner was made aware. Pt refusing vitals signs.

## 2020-06-01 NOTE — ED Triage Notes (Signed)
Patient returned to the ED stating she was here earlier for flu like symptoms after receiving the Covid-19 vaccine Thursday. States when she got home her fatigue and generalized body aches worsened and began feeling short of breath.

## 2020-06-13 ENCOUNTER — Other Ambulatory Visit: Payer: Self-pay

## 2020-06-13 ENCOUNTER — Emergency Department (HOSPITAL_COMMUNITY): Payer: BC Managed Care – PPO

## 2020-06-13 ENCOUNTER — Emergency Department (HOSPITAL_COMMUNITY)
Admission: EM | Admit: 2020-06-13 | Discharge: 2020-06-13 | Disposition: A | Discharge status: Home / Self Care | Payer: BC Managed Care – PPO | Attending: Emergency Medicine | Admitting: Emergency Medicine

## 2020-06-13 ENCOUNTER — Encounter (HOSPITAL_COMMUNITY): Payer: Self-pay | Admitting: Emergency Medicine

## 2020-06-13 DIAGNOSIS — Y9389 Activity, other specified: Secondary | ICD-10-CM | POA: Insufficient documentation

## 2020-06-13 DIAGNOSIS — Y9289 Other specified places as the place of occurrence of the external cause: Secondary | ICD-10-CM | POA: Diagnosis not present

## 2020-06-13 DIAGNOSIS — S83412A Sprain of medial collateral ligament of left knee, initial encounter: Secondary | ICD-10-CM | POA: Diagnosis not present

## 2020-06-13 DIAGNOSIS — Y999 Unspecified external cause status: Secondary | ICD-10-CM | POA: Diagnosis not present

## 2020-06-13 DIAGNOSIS — X501XXA Overexertion from prolonged static or awkward postures, initial encounter: Secondary | ICD-10-CM | POA: Insufficient documentation

## 2020-06-13 DIAGNOSIS — Z91010 Allergy to peanuts: Secondary | ICD-10-CM | POA: Diagnosis not present

## 2020-06-13 DIAGNOSIS — Z87891 Personal history of nicotine dependence: Secondary | ICD-10-CM | POA: Diagnosis not present

## 2020-06-13 DIAGNOSIS — S80912A Unspecified superficial injury of left knee, initial encounter: Secondary | ICD-10-CM | POA: Diagnosis present

## 2020-06-13 MED ORDER — NAPROXEN 375 MG PO TABS: 375.0000 mg | ORAL_TABLET | Freq: Two times a day (BID) | ORAL | 0 refills | Status: DC

## 2020-06-13 NOTE — ED Triage Notes (Signed)
Pt reports last week was at work when pivot with a box causing left knee pains. Reports felt a pop and had continued pain in left knee x 3 days. Denies falls.

## 2020-06-13 NOTE — Discharge Instructions (Signed)
Contact a health care provider if: You have pain that gets worse. The cast, brace, or splint does not fit right. The cast, brace, or splint gets damaged.

## 2020-06-13 NOTE — ED Provider Notes (Signed)
[facility] DEPT Provider Note   CSN: [id number] Arrival date & time: 06/13/20  1203     History Chief Complaint  Patient presents with  . Knee Pain    left    Maria Sampson is a 29 y.o. female who presents with cc of Left knee pain. The patient twisted on her left knee with a planted foot. She was holding a heavy box in her hands. She felt a pop in the knee cap. She had immediate pain. She was able to ambulate. She describes the pain as aching, rates it as 8/10, pain is worse ambulation and extension with better with rest and flexion. Pain does notradiates. She has tried tylenol, aleve, ice and rest without sig improvement . She has  No previous injury to the left knee. Denies Numbness, or weakness.  HPI     Past Medical History:  Diagnosis Date  . Depression   . Otitis externa due to Pseudomonas aeruginosa 04/2014    Patient Active Problem List   Diagnosis Date Noted  . Constipation 02/26/2017  . MDD (major depressive disorder), recurrent severe, without psychosis ([place]) 02/26/2017  . Suicide attempt ([place]) 02/20/2017  . Suicide attempt by acetaminophen overdose ([place]) 02/20/2017  . Mild tetrahydrocannabinol (THC) abuse 02/20/2017  . BPV (benign positional vertigo) 10/27/2015  . Malnutrition of moderate degree 10/26/2015  . Abdominal pain 10/23/2015  . Nausea & vomiting 10/23/2015  . Sepsis ([place]) 10/23/2015  . Fibroid, uterine 10/23/2015  . Ileus ([place]) 10/23/2015  . Normocytic anemia 10/23/2015  . Overdose 06/03/2014  . Depressive disorder 06/03/2014  . Suicidal ideation 06/03/2014  . Otitis externa due to Pseudomonas aeruginosa 05/03/2014    Past Surgical History:  Procedure Laterality Date  . DENTAL EXAMINATION UNDER ANESTHESIA       OB History   No obstetric history on file.     Family History  Problem Relation Age of Onset  . Crohn's disease Other   . Crohn's disease Mother     Social History   Tobacco Use  .  Smoking status: Former Smoker    Types: Cigars  . Smokeless tobacco: Never Used  . Tobacco comment: 2 per day  Vaping Use  . Vaping Use: Never used  Substance Use Topics  . Alcohol use: Yes    Comment: Social  . Drug use: Not Currently    Types: Marijuana    Home Medications Prior to Admission medications   Medication Sig Start Date End Date Taking? Authorizing Provider  acetaminophen (TYLENOL) 500 MG tablet Take 1,000 mg by mouth every 6 (six) hours as needed.    [provider]  cyclobenzaprine (FLEXERIL) 10 MG tablet Take 1 tablet (10 mg total) by mouth 2 (two) times daily as needed for muscle spasms. 03/15/20   Bast, Tressia Miners A, NP  Pheniramine-PE-APAP (THERAFLU COLD & SORE THROAT) 20-10-325 MG PACK Take 1 Package by mouth daily as needed (Flu symptons).    [provider]  predniSONE (STERAPRED UNI-PAK 21 TAB) 10 MG (21) TBPK tablet 6 tabs for 1 day, then 5 tabs for 1 das, then 4 tabs for 1 day, then 3 tabs for 1 day, 2 tabs for 1 day, then 1 tab for 1 day 03/15/20   Loura Halt A, NP  traMADol (ULTRAM) 50 MG tablet Take 1 tablet (50 mg total) by mouth every 12 (twelve) hours as needed. 03/15/20   Orvan July, NP    Allergies    Peanut-containing drug products  Review  of Systems   Review of Systems  Musculoskeletal: Positive for gait problem.       Left knee pain  Skin: Negative for wound.  Neurological: Negative for weakness and numbness.    Physical Exam Updated Vital Signs BP (!) 163/108 (BP Location: Left Arm)   Pulse 76   Temp 98.2 F (36.8 C) (Oral)   Resp 16   LMP 06/06/2020   SpO2 100%   Physical Exam Vitals and nursing note reviewed.  Constitutional:      General: She is not in acute distress.    Appearance: She is well-developed. She is not diaphoretic.  HENT:     Head: Normocephalic and atraumatic.  Eyes:     General: No scleral icterus.    Conjunctiva/sclera: Conjunctivae normal.  Cardiovascular:     Rate and Rhythm: Normal rate  and regular rhythm.     Heart sounds: Normal heart sounds. No murmur heard.  No friction rub. No gallop.   Pulmonary:     Effort: Pulmonary effort is normal. No respiratory distress.     Breath sounds: Normal breath sounds.  Abdominal:     General: Bowel sounds are normal. There is no distension.     Palpations: Abdomen is soft. There is no mass.     Tenderness: There is no abdominal tenderness. There is no guarding.  Musculoskeletal:     Cervical back: Normal range of motion.     Right knee: Normal.     Left knee: Swelling present. No deformity, effusion, erythema, ecchymosis, lacerations or bony tenderness. Normal range of motion. Tenderness present over the MCL and patellar tendon. No LCL laxity, MCL laxity, ACL laxity or PCL laxity.Normal pulse.     Instability Tests: Anterior drawer test negative. Posterior drawer test negative. Anterior Lachman test negative. Medial McMurray test negative and lateral McMurray test negative.  Skin:    General: Skin is warm and dry.  Neurological:     Mental Status: She is alert and oriented to person, place, and time.  Psychiatric:        Behavior: Behavior normal.     ED Results / Procedures / Treatments   Labs (all labs ordered are listed, but only abnormal results are displayed) Labs Reviewed - No data to display  EKG None  Radiology No results found.  Procedures Procedures (including critical care time)  Medications Ordered in ED Medications - No data to display  ED Course  I have reviewed the triage vital signs and the nursing notes.  Pertinent labs & imaging results that were available during my care of the patient were reviewed by me and considered in my medical decision making (see chart for details).    MDM Rules/Calculators/A&P                            Patient X-Ray negative for obvious fracture or dislocation.  Personally reviewed images of the 4 view knee x-ray which showed no acute abnormality.  Pain managed in  ED. Pt advised to follow up with orthopedics if symptoms persist for possibility of missed fracture diagnosis. Patient given brace while in ED, conservative therapy recommended and discussed. Patient will be dc home & is agreeable with above plan.  Final Clinical Impression(s) / ED Diagnoses Final diagnoses:  Sprain of medial collateral ligament of left knee, initial encounter    Rx / DC Orders ED Discharge Orders    None       Ellijah Leffel,  Maryjane Hurter 06/13/20 1539    Blanchie Dessert, MD 06/14/20 1554

## 2020-06-13 NOTE — Progress Notes (Signed)
Orthopedic Tech Progress Note Patient Details:  Maria Sampson 07-May-1991 [id number] Knee brace and crutches Patient ID: [name], female   DOB: 03/02/1991, 29 y.o.   MRN: [id number]   Braulio Bosch 06/13/2020, 2:56 PM

## 2020-06-28 ENCOUNTER — Telehealth: Payer: BC Managed Care – PPO | Admitting: Nurse Practitioner

## 2020-06-28 DIAGNOSIS — M546 Pain in thoracic spine: Secondary | ICD-10-CM | POA: Diagnosis not present

## 2020-06-28 MED ORDER — NAPROXEN 500 MG PO TABS: 500.0000 mg | ORAL_TABLET | Freq: Two times a day (BID) | ORAL | 1 refills | Status: DC

## 2020-06-28 MED ORDER — CYCLOBENZAPRINE HCL 10 MG PO TABS: 10.0000 mg | ORAL_TABLET | Freq: Three times a day (TID) | ORAL | 1 refills | Status: DC | PRN

## 2020-06-28 NOTE — Progress Notes (Signed)

## 2020-09-12 ENCOUNTER — Telehealth: Payer: BC Managed Care – PPO | Admitting: Emergency Medicine

## 2020-09-12 DIAGNOSIS — J069 Acute upper respiratory infection, unspecified: Secondary | ICD-10-CM

## 2020-09-12 MED ORDER — BENZONATATE 100 MG PO CAPS: 100.0000 mg | ORAL_CAPSULE | Freq: Two times a day (BID) | ORAL | 0 refills | Status: DC | PRN

## 2020-09-12 MED ORDER — IPRATROPIUM BROMIDE 0.03 % NA SOLN: 2.0000 | Freq: Two times a day (BID) | NASAL | 0 refills | Status: DC

## 2020-09-12 MED ORDER — ALBUTEROL SULFATE HFA 108 (90 BASE) MCG/ACT IN AERS: 2.0000 | INHALATION_SPRAY | RESPIRATORY_TRACT | 0 refills | Status: DC | PRN

## 2020-09-12 NOTE — Progress Notes (Signed)
We are sorry you are not feeling well.  Here is how we plan to help!  Based on what you have shared with me, it looks like you may have a viral upper respiratory infection.  Upper respiratory infections are caused by a large number of viruses; however, rhinovirus is the most common cause.   Symptoms vary from person to person, with common symptoms including sore throat, cough, fatigue or lack of energy and feeling of general discomfort.  A low-grade fever of up to 100.4 may present, but is often uncommon.  Symptoms vary however, and are closely related to a person's age or underlying illnesses.  The most common symptoms associated with an upper respiratory infection are nasal discharge or congestion, cough, sneezing, headache and pressure in the ears and face.  These symptoms usually persist for about 3 to 10 days, but can last up to 2 weeks.  It is important to know that upper respiratory infections do not cause serious illness or complications in most cases.    Upper respiratory infections can be transmitted from person to person, with the most common method of transmission being a person's hands.  The virus is able to live on the skin and can infect other persons for up to 2 hours after direct contact.  Also, these can be transmitted when someone coughs or sneezes; thus, it is important to cover the mouth to reduce this risk.  To keep the spread of the illness at [place], good hand hygiene is very important.  This is an infection that is most likely caused by a virus. There are no specific treatments other than to help you with the symptoms until the infection runs its course.  We are sorry you are not feeling well.  Here is how we plan to help!   For nasal congestion, you may use an oral decongestants such as Mucinex D or if you have glaucoma or high blood pressure use plain Mucinex.  Saline nasal spray or nasal drops can help and can safely be used as often as needed for congestion.  For your congestion,  I have prescribed Ipratropium Bromide nasal spray 0.03% two sprays in each nostril 2-3 times a day  If you do not have a history of heart disease, hypertension, diabetes or thyroid disease, prostate/bladder issues or glaucoma, you may also use Sudafed to treat nasal congestion.  It is highly recommended that you consult with a pharmacist or your primary care physician to ensure this medication is safe for you to take.     If you have a cough, you may use cough suppressants such as Delsym and Robitussin.  If you have glaucoma or high blood pressure, you can also use Coricidin HBP.   For cough I have prescribed for you A prescription cough medication called Tessalon Perles 100 mg. You may take 1-2 capsules every 8 hours as needed for cough   I've also sent an inhaler that may help with the breathing.  If you don't improve in the next couple of days, you should be seen in person.  If you have a sore or scratchy throat, use a saltwater gargle-  to  teaspoon of salt dissolved in a 4-ounce to 8-ounce glass of warm water.  Gargle the solution for approximately 15-30 seconds and then spit.  It is important not to swallow the solution.  You can also use throat lozenges/cough drops and Chloraseptic spray to help with throat pain or discomfort.  Warm or cold liquids can also  be helpful in relieving throat pain.  For headache, pain or general discomfort, you can use Ibuprofen or Tylenol as directed.   Some authorities believe that zinc sprays or the use of Echinacea may shorten the course of your symptoms.   HOME CARE . Only take medications as instructed by your medical team. . Be sure to drink plenty of fluids. Water is fine as well as fruit juices, sodas and electrolyte beverages. You may want to stay away from caffeine or alcohol. If you are nauseated, try taking small sips of liquids. How do you know if you are getting enough fluid? Your urine should be a pale yellow or almost colorless. . Get  rest. . Taking a steamy shower or using a humidifier may help nasal congestion and ease sore throat pain. You can place a towel over your head and breathe in the steam from hot water coming from a faucet. . Using a saline nasal spray works much the same way. . Cough drops, hard candies and sore throat lozenges may ease your cough. . Avoid close contacts especially the very young and the elderly . Cover your mouth if you cough or sneeze . Always remember to wash your hands.   GET HELP RIGHT AWAY IF: . You develop worsening fever. . If your symptoms do not improve within 10 days . You develop yellow or green discharge from your nose over 3 days. . You have coughing fits . You develop a severe head ache or visual changes. . You develop shortness of breath, difficulty breathing or start having chest pain . Your symptoms persist after you have completed your treatment plan  MAKE SURE YOU   Understand these instructions.  Will watch your condition.  Will get help right away if you are not doing well or get worse.  Your e-visit answers were reviewed by a board certified advanced clinical practitioner to complete your personal care plan. Depending upon the condition, your plan could have included both over the counter or prescription medications. Please review your pharmacy choice. If there is a problem, you may call our nursing hot line at and have the prescription routed to another pharmacy. Your safety is important to [place]. If you have drug allergies check your prescription carefully.   You can use MyChart to ask questions about today's visit, request a non-urgent call back, or ask for a work or school excuse for 24 hours related to this e-Visit. If it has been greater than 24 hours you will need to follow up with your provider, or enter a new e-Visit to address those concerns. You will get an e-mail in the next two days asking about your experience.  I hope that your e-visit has been valuable  and will speed your recovery. Thank you for using e-visits.     Approximately 5 minutes was used in reviewing the patient's chart, questionnaire, prescribing medications, and documentation.

## 2020-11-27 ENCOUNTER — Telehealth: Payer: BC Managed Care – PPO | Admitting: Family

## 2020-11-27 DIAGNOSIS — Z20822 Contact with and (suspected) exposure to covid-19: Secondary | ICD-10-CM

## 2020-11-27 MED ORDER — BENZONATATE 100 MG PO CAPS: 100.0000 mg | ORAL_CAPSULE | Freq: Three times a day (TID) | ORAL | 0 refills | Status: DC | PRN

## 2020-11-27 NOTE — Progress Notes (Signed)
E-Visit for Corona Virus Screening  Your current symptoms could be consistent with the coronavirus.  Many health care providers can now test patients at their office but not all are.  [facility] has multiple testing sites. For information on our COVID testing locations and hours go to [email or web address]  We are enrolling you in our MyChart Home Monitoring for COVID19 . Daily you will receive a questionnaire within the MyChart website. Our COVID 19 response team will be monitoring your responses daily.  Testing Information: The COVID-19 Community Testing sites are testing BY APPOINTMENT ONLY.  You can schedule online at [email or web address]  If you do not have access to a smart phone or computer you may call [phone] for an appointment.   Additional testing sites in the Community:  . For [facility] Testing sites in [place]  https://www.[facility]vs.com/minuteclinic/covid-19-testing  . For Pop-up testing sites in [place]  https://covid19.ncdhhs.gov/about-covid-19/testing/find-my-testing-place/pop-testing-sites  . For [facility] https://[place].gov[place]/human-services/health-department/coronavirus-covid-19-info/covid-19-testing  . For [place] testing in [place] and [place] https://www.[place].gov/[place]/human-services/health-department/coronavirus-covid-19-info/covid-19-testing  . For [facility] testing in [place]   https://[facility].care/covidtesting  For  more information about community testing call [phone]   Please quarantine yourself while awaiting your test results. Please stay home for a minimum of 10 days from the first day of illness with improving symptoms and you have had 24 hours of no fever (without the use of Tylenol (Acetaminophen) Motrin (Ibuprofen) or any fever reducing medication).  Also - Do not get tested prior to returning to work because once you have had a positive test the test can stay  positive for more than a month in some cases.   You should wear a mask or cloth face covering over your nose and mouth if you must be around other people or animals, including pets (even at home). Try to stay at least 6 feet away from other people. This will protect the people around you.  Please continue good preventive care measures, including:  frequent hand-washing, avoid touching your face, cover coughs/sneezes, stay out of crowds and keep a 6 foot distance from others.  COVID-19 is a respiratory illness with symptoms that are similar to the flu. Symptoms are typically mild to moderate, but there have been cases of severe illness and death due to the virus.   The following symptoms may appear 2-14 days after exposure: . Fever . Cough . Shortness of breath or difficulty breathing . Chills . Repeated shaking with chills . Muscle pain . Headache . Sore throat . New loss of taste or smell . Fatigue . Congestion or runny nose . Nausea or vomiting . Diarrhea  Go to the nearest hospital ED for assessment if fever/cough/breathlessness are severe or illness seems like a threat to life.  It is vitally important that if you feel that you have an infection such as this virus or any other virus that you stay home and away from places where you may spread it to others.  You should avoid contact with people age 65 and older.   You can use medication such as A prescription cough medication called Tessalon Perles 100 mg. You may take 1-2 capsules every 8 hours as needed for cough.  You may also take acetaminophen (Tylenol) as needed for fever.  Reduce your risk of any infection by using the same precautions used for avoiding the common cold or flu:  . Wash your hands often with soap and warm water for at least 20 seconds.  If soap and water are not   readily available, use an alcohol-based hand sanitizer with at least 60% alcohol.  . If coughing or sneezing, cover your mouth and nose by coughing or  sneezing into the elbow areas of your shirt or coat, into a tissue or into your sleeve (not your hands). . Avoid shaking hands with others and consider head nods or verbal greetings only. . Avoid touching your eyes, nose, or mouth with unwashed hands.  . Avoid close contact with people who are sick. . Avoid places or events with large numbers of people in one location, like concerts or sporting events. . Carefully consider travel plans you have or are making. . If you are planning any travel outside or inside the [place], visit the CDC's Travelers' Health webpage for the latest health notices. . If you have some symptoms but not all symptoms, continue to monitor at home and seek medical attention if your symptoms worsen. . If you are having a medical emergency, call 911.  HOME CARE . Only take medications as instructed by your medical team. . Drink plenty of fluids and get plenty of rest. . A steam or ultrasonic humidifier can help if you have congestion.   GET HELP RIGHT AWAY IF YOU HAVE EMERGENCY WARNING SIGNS** FOR COVID-19. If you or someone is showing any of these signs seek emergency medical care immediately. Call 911 or proceed to your closest emergency facility if: . You develop worsening high fever. . Trouble breathing . Bluish lips or face . Persistent pain or pressure in the chest . New confusion . Inability to wake or stay awake . You cough up blood. . Your symptoms become more severe  **This list is not all possible symptoms. Contact your medical provider for any symptoms that are sever or concerning to you.  MAKE SURE YOU   Understand these instructions.  Will watch your condition.  Will get help right away if you are not doing well or get worse.  Your e-visit answers were reviewed by a board certified advanced clinical practitioner to complete your personal care plan.  Depending on the condition, your plan could have included both over the counter or prescription  medications.  If there is a problem please reply once you have received a response from your provider.  Your safety is important to us.  If you have drug allergies check your prescription carefully.    You can use MyChart to ask questions about today's visit, request a non-urgent call back, or ask for a work or school excuse for 24 hours related to this e-Visit. If it has been greater than 24 hours you will need to follow up with your provider, or enter a new e-Visit to address those concerns. You will get an e-mail in the next two days asking about your experience.  I hope that your e-visit has been valuable and will speed your recovery. Thank you for using e-visits.   Approximately 5 minutes was spent documenting and reviewing patient's chart.   

## 2020-11-28 ENCOUNTER — Telehealth: Payer: Self-pay | Admitting: *Deleted

## 2020-11-28 NOTE — Telephone Encounter (Signed)
Pt response to COVID questions in MyChart- companion: Question 11/28/2020 8:01 AM EST - Filed by Patient  Are you feeling short of breath today? No  Are you having a cough today? Yes  Is the cough better, the same, or worse than yesterday?  NewAbnormal  Are you experiencing weakness today? Yes  Is the weakness better, the same, or worse than yesterday? WorseAbnormal  Are you vomiting? No  How is your appetite compared to yesterday? Same  Are you experiencing diarrhea?  Yes  Is the diarrhea new today, better, the same, or worse than yesterday? NewAbnormal   Call to patient advised: Cough- patient is only using Dayquil at this time. Patient states she has coughing spells and that the mucus looks dark- she has not really looked at it. Advised treat cough with cough medications(Robutusin, Musinex- drink fliuds to break up mucus- contact UC/Virtual visit on line for increasing symptoms. Diarrhea started today- dark brown/black in color- advised Oral fliuds, bland diet, monitor for signs of dehydration,try OTC medication- if becomes worse or color continues to be the same - contact PCP using UC/virtual visit.  Discussed cold chills and monitor temperature try not to layer to prevent overheating and push fliuds. Patient reports she does have someone who can get OTC medications for her.  Reviewed reasons to go to ED-  And again urged patient to reach out if she gets worse and continue to fill out her questionnaire so her progress can be monitored.

## 2020-11-29 ENCOUNTER — Telehealth: Payer: Self-pay

## 2020-11-29 ENCOUNTER — Encounter (INDEPENDENT_AMBULATORY_CARE_PROVIDER_SITE_OTHER): Payer: Self-pay

## 2020-11-29 NOTE — Telephone Encounter (Signed)
Patient states doesn't have a appetite but will try to eat something this morning. Patient has been drinking fluids and have been urinating. Patient was advised on symptoms per protocol as follows:   If appetite becomes worse: encourage patient to drink fluids as tolerated, work their way up to bland solid food such as crackers, pretzels, soup, bread or applesauce and boiled starches.   If patient is unable to tolerate any foods or liquids, notify PCP.   IF PATIENT DEVELOPS SEVERE VOMITING (MORE THAN 6 TIMES A DAY AND OR >8 HOURS) AND/OR SEVERE ABDOMINAL PAIN ADVISE PATIENT TO CALL 911 AND SEEK TREATMENT IN ED

## 2021-01-17 ENCOUNTER — Encounter: Payer: Self-pay | Admitting: Family Medicine

## 2021-01-17 ENCOUNTER — Telehealth: Payer: BC Managed Care – PPO | Admitting: Family Medicine

## 2021-01-17 DIAGNOSIS — Z7189 Other specified counseling: Secondary | ICD-10-CM

## 2021-01-17 NOTE — Patient Instructions (Signed)
Arm soreness is expected after most vaccines. Rest, Ice or heat as needed the arm. Avoid heavy lifting at work as needed Tylenol or Ibuprofen as needed It should subside in another 1-2 days.

## 2021-01-17 NOTE — Progress Notes (Signed)
Ms. allegra, cerniglia are scheduled for a virtual visit with your provider today.    Just as we do with appointments in the office, we must obtain your consent to participate.  Your consent will be active for this visit and any virtual visit you may have with one of our providers in the next 365 days.    If you have a MyChart account, I can also send a copy of this consent to you electronically.  All virtual visits are billed to your insurance company just like a traditional visit in the office.  As this is a virtual visit, video technology does not allow for your provider to perform a traditional examination.  This may limit your provider's ability to fully assess your condition.  If your provider identifies any concerns that need to be evaluated in person or the need to arrange testing such as labs, EKG, etc, we will make arrangements to do so.    Although advances in technology are sophisticated, we cannot ensure that it will always work on either your end or our end.  If the connection with a video visit is poor, we may have to switch to a telephone visit.  With either a video or telephone visit, we are not always able to ensure that we have a secure connection.   I need to obtain your verbal consent now.   Are you willing to proceed with your visit today?   Maria Sampson has provided verbal consent on 01/17/2021 for a virtual visit (video or telephone).   Perlie Mayo, NP 01/17/2021  3:20 PM   Date:  01/17/2021   ID:  Maria Sampson, DOB 11-08-1991, MRN [id number]  Patient Location: Home Provider Location: Home Office   Participants: Patient and Provider for Visit and Wrap up  Method of visit: Video  Location of Patient: Home Location of Provider: Home Office Consent was obtain for visit over the video. Services rendered by provider: Visit was performed via video  A video enabled telemedicine application was used and I verified that I am speaking with the correct person using two  identifiers.  PCP:  Patient, No Pcp Per   Chief Complaint:  covid booster questions  History of Present Illness:    Maria Sampson is a 30 y.o. female with history as stated below. Presents video telehealth for an acute care visit secondary to questions about COVID 19 vaccines/booster.   Onset of symptoms was last 24 hours post booster vaccine and symptoms have been persistent and include: arm soreness and muscle pain.   Of note she does not feel sick, but reports she did have COVID in Jan and was worried she got the booster too soon.  Denies having fevers, chills, shortness of breath, cough, chest pain, ear pain, sore throat or exposure to covid or other sick contacts.  Past Medical, Surgical, Social History, Allergies, and Medications have been Reviewed.  Past Medical History:  Diagnosis Date  . Depression   . Otitis externa due to Pseudomonas aeruginosa 04/2014    No outpatient medications have been marked as taking for the 01/17/21 encounter (Video Visit) with Perlie Mayo, NP.     Allergies:   Peanut-containing drug products   ROS See HPI for history of present illness.  Physical Exam Constitutional:      Appearance: Normal appearance.  HENT:     Head: Normocephalic and atraumatic.     Right Ear: External ear normal.     Left Ear: External ear  normal.  Eyes:     Extraocular Movements: Extraocular movements intact.     Conjunctiva/sclera: Conjunctivae normal.  Musculoskeletal:        General: Normal range of motion.     Cervical back: Normal range of motion.  Neurological:     Mental Status: She is alert and oriented to person, place, and time.  Psychiatric:        Mood and Affect: Mood normal.        Behavior: Behavior normal.        Thought Content: Thought content normal.        Judgment: Judgment normal.               A&P  1. Educated about COVID-19 virus infection -no s&S of COVID infection -muscle soreness -explained this can be variation for  each person, but advise of RICe measures as needed -pt verbalized understanding   Time:   Today, I have spent 10 minutes with the patient with telehealth technology discussing the above problems, reviewing the chart, previous notes, medications and orders.    Tests Ordered: No orders of the defined types were placed in this encounter.   Medication Changes: No orders of the defined types were placed in this encounter.    Disposition:  Follow up as needed Signed, Perlie Mayo, NP  01/17/2021 3:20 PM

## 2021-01-31 ENCOUNTER — Encounter: Payer: Self-pay | Admitting: Family Medicine

## 2021-01-31 ENCOUNTER — Telehealth: Payer: BC Managed Care – PPO | Admitting: Physician Assistant

## 2021-01-31 ENCOUNTER — Telehealth: Payer: BC Managed Care – PPO | Admitting: Family Medicine

## 2021-01-31 DIAGNOSIS — U071 COVID-19: Secondary | ICD-10-CM | POA: Diagnosis not present

## 2021-01-31 MED ORDER — PROMETHAZINE-DM 6.25-15 MG/5ML PO SYRP: 2.5000 mL | ORAL_SOLUTION | Freq: Three times a day (TID) | ORAL | 0 refills | Status: DC | PRN

## 2021-01-31 MED ORDER — BENZONATATE 100 MG PO CAPS: 100.0000 mg | ORAL_CAPSULE | Freq: Three times a day (TID) | ORAL | 0 refills | Status: DC | PRN

## 2021-01-31 NOTE — Progress Notes (Signed)
I have spent 5 minutes in review of e-visit questionnaire, review and updating patient chart, medical decision making and response to patient.   Claire Dolores Cody Vetta Couzens, PA-C    

## 2021-01-31 NOTE — Progress Notes (Signed)
E-Visit for Positive Covid Test Result We are sorry you are not feeling well. We are here to help!  You have tested positive for COVID-19, meaning that you were infected with the novel coronavirus and could give the virus to others.  It is vitally important that you stay home so you do not spread it to others.      Please continue isolation at home, for at least 10 days since the start of your symptoms and until you have had 24 hours with no fever (without taking a fever reducer) and with improving of symptoms.  If you have no symptoms but tested positive (or all symptoms resolve after 5 days and you have no fever) you can leave your house but continue to wear a mask around others for an additional 5 days. If you have a fever,continue to stay home until you have had 24 hours of no fever. Most cases improve 5-10 days from onset but we have seen a small number of patients who have gotten worse after the 10 days.  Please be sure to watch for worsening symptoms and remain taking the proper precautions.   Go to the nearest hospital ED for assessment if fever/cough/breathlessness are severe or illness seems like a threat to life.    The following symptoms may appear 2-14 days after exposure: . Fever . Cough . Shortness of breath or difficulty breathing . Chills . Repeated shaking with chills . Muscle pain . Headache . Sore throat . New loss of taste or smell . Fatigue . Congestion or runny nose . Nausea or vomiting . Diarrhea  You have been enrolled in [facility] for COVID-19. Daily you will receive a questionnaire within the [facility] website. Our COVID-19 response team will be monitoring your responses daily.  You can use medication such as prescription cough medication called Tessalon Perles 100 mg. You may take 1-2 capsules every 8 hours as needed for cough  You have tested positive for Covid but because you are not considered high risk you do not qualify for monoclonal  antibody infusion.  Supportive care is all that is needed.   You may also take acetaminophen (Tylenol) as needed for fever.  HOME CARE: . Only take medications as instructed by your medical team. . Drink plenty of fluids and get plenty of rest. . A steam or ultrasonic humidifier can help if you have congestion.   GET HELP RIGHT AWAY IF YOU HAVE EMERGENCY WARNING SIGNS.  Call 911 or proceed to your closest emergency facility if: . You develop worsening high fever. . Trouble breathing . Bluish lips or face . Persistent pain or pressure in the chest . New confusion . Inability to wake or stay awake . You cough up blood. . Your symptoms become more severe . Inability to hold down food or fluids  This list is not all possible symptoms. Contact your medical provider for any symptoms that are severe or concerning to you.    Your e-visit answers were reviewed by a board certified advanced clinical practitioner to complete your personal care plan.  Depending on the condition, your plan could have included both over the counter or prescription medications.  If there is a problem please reply once you have received a response from your provider.  Your safety is important to [place].  If you have drug allergies check your prescription carefully.    You can use MyChart to ask questions about today's visit, request a non-urgent call back, or ask  for a work or school excuse for 24 hours related to this e-Visit. If it has been greater than 24 hours you will need to follow up with your provider, or enter a new e-Visit to address those concerns. You will get an e-mail in the next two days asking about your experience.  I hope that your e-visit has been valuable and will speed your recovery. Thank you for using e-visits.

## 2021-01-31 NOTE — Patient Instructions (Signed)

## 2021-01-31 NOTE — Progress Notes (Signed)
[name], rote are scheduled for a virtual visit with your provider today.    Just as we do with appointments in the office, we must obtain your consent to participate.  Your consent will be active for this visit and any virtual visit you may have with one of our providers in the next 365 days.    If you have a MyChart account, I can also send a copy of this consent to you electronically.  All virtual visits are billed to your insurance company just like a traditional visit in the office.  As this is a virtual visit, video technology does not allow for your provider to perform a traditional examination.  This may limit your provider's ability to fully assess your condition.  If your provider identifies any concerns that need to be evaluated in person or the need to arrange testing such as labs, EKG, etc, we will make arrangements to do so.    Although advances in technology are sophisticated, we cannot ensure that it will always work on either your end or our end.  If the connection with a video visit is poor, we may have to switch to a telephone visit.  With either a video or telephone visit, we are not always able to ensure that we have a secure connection.   I need to obtain your verbal consent now.   Are you willing to proceed with your visit today?   [name] has provided verbal consent on 01/31/2021 for a virtual visit (video or telephone).   Perlie Mayo, NP 01/31/2021  2:33 PM   Date:  01/31/2021   ID:  Maria Sampson, DOB September 15, 1991, MRN [id number]  Patient Location: Home Provider Location: Home Office   Participants: Patient and Provider for Visit and Wrap up  Method of visit: Video  Location of Patient: Home Location of Provider: Home Office Consent was obtain for visit over the video. Services rendered by provider: Visit was performed via video  A video enabled telemedicine application was used and I verified that I am speaking with the correct person using two  identifiers.  PCP:  Patient, No Pcp Per   Chief Complaint:  covid + HT  History of Present Illness:    Maria Sampson is a 30 y.o. female with history as stated below. Presents video telehealth for an acute care visit secondary to COVID + HT. She took two home test after becoming symptomatic on Sunday with diarrhea, chills- shaking, runny nose, sore throat, and fatiuge. She also reports a mild cough at night, limited shortness of breath, but no chest pain. Works for [employer] and is around a lot of people.  Of note I saw her for a VV due to reaction of booster back at the beginning of March.  Past Medical, Surgical, Social History, Allergies, and Medications have been Reviewed.  Past Medical History:  Diagnosis Date  . Depression   . Otitis externa due to Pseudomonas aeruginosa 04/2014    Current Meds  Medication Sig  . promethazine-dextromethorphan (PROMETHAZINE-DM) 6.25-15 MG/5ML syrup Take 2.5 mLs by mouth 3 (three) times daily as needed for cough.     Allergies:   Peanut-containing drug products   ROS See HPI for history of present illness.  Physical Exam Constitutional:      Appearance: Normal appearance.  HENT:     Head: Normocephalic and atraumatic.     Right Ear: External ear normal.     Left Ear: External ear normal.  Nose: Nose normal.  Eyes:     Conjunctiva/sclera: Conjunctivae normal.  Pulmonary:     Comments: No shortness of breath or cough in conversation Musculoskeletal:        General: Normal range of motion.     Cervical back: Normal range of motion.  Neurological:     Mental Status: She is alert and oriented to person, place, and time.  Psychiatric:        Mood and Affect: Mood normal.        Behavior: Behavior normal.        Thought Content: Thought content normal.        Judgment: Judgment normal.               A&P  1. COVID-19 -S&S consistent with COVID and she took 2 HT that were + -OTC meaures reviewed - cough syrup provided-  advised of dizziness and side effects of medication -hydration reviewed -And isolation reviewed  Work note provided -Patient acknowledged agreement and understanding of the plan.    - promethazine-dextromethorphan (PROMETHAZINE-DM) 6.25-15 MG/5ML syrup; Take 2.5 mLs by mouth 3 (three) times daily as needed for cough.  Dispense: 118 mL; Refill: 0    Time:   Today, I have spent 10 minutes with the patient with telehealth technology discussing the above problems, reviewing the chart, previous notes, medications and orders.    Tests Ordered: No orders of the defined types were placed in this encounter.   Medication Changes: Meds ordered this encounter  Medications  . promethazine-dextromethorphan (PROMETHAZINE-DM) 6.25-15 MG/5ML syrup    Sig: Take 2.5 mLs by mouth 3 (three) times daily as needed for cough.    Dispense:  118 mL    Refill:  0    Order Specific Question:   Supervising Provider    Answer:   Fayrene Helper [[id number]]     Disposition:  Follow up as needed  Signed, Perlie Mayo, NP  01/31/2021 2:33 PM

## 2021-02-03 ENCOUNTER — Telehealth: Payer: Self-pay

## 2021-02-03 NOTE — Telephone Encounter (Signed)
Pt. Reports her appetite has been bad "for days." Encouraged to try soups, broths, yogurt. States she likes fruit - will eat more fruit. Does not feel weak. Drinking "plenty of fluids." Urinating well. Instructed to continue to fill out questionnaire. Verbalizes understanding.

## 2021-04-01 ENCOUNTER — Encounter (HOSPITAL_COMMUNITY): Payer: Self-pay | Admitting: Emergency Medicine

## 2021-04-01 ENCOUNTER — Emergency Department (HOSPITAL_COMMUNITY)
Admission: EM | Admit: 2021-04-01 | Discharge: 2021-04-01 | Disposition: A | Discharge status: Home / Self Care | Payer: BC Managed Care – PPO | Attending: Emergency Medicine | Admitting: Emergency Medicine

## 2021-04-01 ENCOUNTER — Emergency Department (HOSPITAL_COMMUNITY): Payer: BC Managed Care – PPO

## 2021-04-01 ENCOUNTER — Other Ambulatory Visit: Payer: Self-pay

## 2021-04-01 ENCOUNTER — Telehealth: Payer: BC Managed Care – PPO | Admitting: Orthopedic Surgery

## 2021-04-01 DIAGNOSIS — H9202 Otalgia, left ear: Secondary | ICD-10-CM | POA: Diagnosis not present

## 2021-04-01 DIAGNOSIS — Z87891 Personal history of nicotine dependence: Secondary | ICD-10-CM | POA: Insufficient documentation

## 2021-04-01 DIAGNOSIS — R6884 Jaw pain: Secondary | ICD-10-CM | POA: Diagnosis not present

## 2021-04-01 DIAGNOSIS — S060X1A Concussion with loss of consciousness of 30 minutes or less, initial encounter: Secondary | ICD-10-CM | POA: Diagnosis not present

## 2021-04-01 DIAGNOSIS — S0990XA Unspecified injury of head, initial encounter: Secondary | ICD-10-CM | POA: Diagnosis present

## 2021-04-01 DIAGNOSIS — S069X0A Unspecified intracranial injury without loss of consciousness, initial encounter: Secondary | ICD-10-CM

## 2021-04-01 DIAGNOSIS — M542 Cervicalgia: Secondary | ICD-10-CM | POA: Diagnosis not present

## 2021-04-01 LAB — I-STAT BETA HCG BLOOD, ED (MC, WL, AP ONLY): I-stat hCG, quantitative: <5 m[IU]/mL (ref <5)

## 2021-04-01 MED ORDER — ACETAMINOPHEN 325 MG PO TABS
650.0000 mg | ORAL_TABLET | Freq: Once | ORAL | Status: AC
  Administered 2021-04-01: 650 mg via ORAL
  Filled 2021-04-01: qty 2

## 2021-04-01 MED ORDER — KETOROLAC TROMETHAMINE 60 MG/2ML IM SOLN
60.0000 mg | Freq: Once | INTRAMUSCULAR | Status: AC
  Administered 2021-04-01: 60 mg via INTRAMUSCULAR
  Filled 2021-04-01: qty 2

## 2021-04-01 MED ORDER — ONDANSETRON 4 MG PO TBDP
4.0000 mg | ORAL_TABLET | Freq: Once | ORAL | Status: AC
  Administered 2021-04-01: 4 mg via ORAL
  Filled 2021-04-01: qty 1
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
## 2021-06-04 ENCOUNTER — Emergency Department (HOSPITAL_COMMUNITY): Payer: BC Managed Care – PPO

## 2021-06-04 ENCOUNTER — Emergency Department (HOSPITAL_COMMUNITY)
Admission: EM | Admit: 2021-06-04 | Discharge: 2021-06-04 | Disposition: A | Discharge status: Home / Self Care | Payer: BC Managed Care – PPO | Attending: Emergency Medicine | Admitting: Emergency Medicine

## 2021-06-04 ENCOUNTER — Encounter (HOSPITAL_COMMUNITY): Payer: Self-pay | Admitting: Emergency Medicine

## 2021-06-04 ENCOUNTER — Other Ambulatory Visit: Payer: Self-pay

## 2021-06-04 DIAGNOSIS — Z91010 Allergy to peanuts: Secondary | ICD-10-CM | POA: Diagnosis not present

## 2021-06-04 DIAGNOSIS — M79672 Pain in left foot: Secondary | ICD-10-CM | POA: Diagnosis not present

## 2021-06-04 MED ORDER — IBUPROFEN 800 MG PO TABS
800.0000 mg | ORAL_TABLET | Freq: Once | ORAL | Status: AC
  Administered 2021-06-04: 800 mg via ORAL
  Filled 2021-06-04: qty 1

## 2021-06-04 NOTE — ED Provider Notes (Signed)
[facility] DEPT Provider Note   CSN: [id number] Arrival date & time: 06/04/21  0800     History Chief Complaint  Patient presents with   Foot Pain    Maria Sampson is a 30 y.o. female presenting for evaluation of foot pain.  Patient states at 8 PM last night she got her left foot caught between a jack and the wall.  Since then, she has had severe pain of her left foot.  She has had increased pain and swelling over the past 12 hours.  She took Tylenol last night without improvement of symptoms, has not taken anything today.  No numbness, she does report difficulty moving her toes due to pain.  No pain elsewhere including of the lower leg or knee.  She has no other medical problems, takes medications daily.  She does not have an orthopedist or podiatrist that she sees.   HPI     Past Medical History:  Diagnosis Date   Depression    Otitis externa due to Pseudomonas aeruginosa 04/2014    Patient Active Problem List   Diagnosis Date Noted   Educated about COVID-19 virus infection 01/17/2021   Constipation 02/26/2017   MDD (major depressive disorder), recurrent severe, without psychosis ([place]) 02/26/2017   Suicide attempt ([place]) 02/20/2017   Suicide attempt by acetaminophen overdose ([place]) 02/20/2017   Mild tetrahydrocannabinol (THC) abuse 02/20/2017   BPV (benign positional vertigo) 10/27/2015   Malnutrition of moderate degree 10/26/2015   Abdominal pain 10/23/2015   Nausea & vomiting 10/23/2015   Sepsis ([place]) 10/23/2015   Fibroid, uterine 10/23/2015   Ileus ([place]) 10/23/2015   Normocytic anemia 10/23/2015   Overdose 06/03/2014   Depressive disorder 06/03/2014   Suicidal ideation 06/03/2014   Otitis externa due to Pseudomonas aeruginosa 05/03/2014    Past Surgical History:  Procedure Laterality Date   DENTAL EXAMINATION UNDER ANESTHESIA       OB History   No obstetric history on file.     Family History  Problem Relation Age of  Onset   Crohn's disease Other    Crohn's disease Mother     Social History   Tobacco Use   Smoking status: Former    Types: Cigars   Smokeless tobacco: Never   Tobacco comments:    2 per day  Vaping Use   Vaping Use: Never used  Substance Use Topics   Alcohol use: Yes    Comment: Social   Drug use: Not Currently    Types: Marijuana    Home Medications Prior to Admission medications   Medication Sig Start Date End Date Taking? Authorizing Provider  acetaminophen (TYLENOL) 500 MG tablet Take 500 mg by mouth every 6 (six) hours as needed for moderate pain.   Yes [provider]    Allergies    Peanut-containing drug products  Review of Systems   Review of Systems  Musculoskeletal:  Positive for arthralgias and joint swelling.  Neurological:  Negative for numbness.   Physical Exam Updated Vital Signs BP 135/72   Pulse 99   Ht 5\' 10"  (1.778 m)   Wt 83.9 kg   LMP 05/21/2021   SpO2 99%   BMI 26.54 kg/m   Physical Exam Vitals and nursing note reviewed.  Constitutional:      General: She is not in acute distress.    Appearance: She is well-developed.  HENT:     Head: Normocephalic and atraumatic.  Eyes:     Extraocular Movements: Extraocular movements  intact.  Cardiovascular:     Rate and Rhythm: Normal rate.  Pulmonary:     Effort: Pulmonary effort is normal.  Abdominal:     General: There is no distension.  Musculoskeletal:        General: Swelling and tenderness present.     Cervical back: Normal range of motion.     Comments: Swelling and tenderness of the left foot, mostly on the mid dorsal aspect.  Pedal pulse 2+ bilaterally.  Good distal sensation and cap refill of the toes.  No tenderness palpation over the lateral malleolus, minimal tenderness over the medial malleolus.  Achilles tendon is palpable and intact.  No pain over the calcaneus.  Skin:    General: Skin is warm.     Capillary Refill: Capillary refill takes less than 2 seconds.      Findings: No rash.  Neurological:     Mental Status: She is alert and oriented to person, place, and time.    ED Results / Procedures / Treatments   Labs (all labs ordered are listed, but only abnormal results are displayed) Labs Reviewed - No data to display  EKG None  Radiology DG Foot Complete Left  Result Date: 06/04/2021 CLINICAL DATA:  Pain post work injury EXAM: LEFT FOOT - COMPLETE 3+ VIEW COMPARISON:  None. FINDINGS: There is no evidence of fracture or dislocation. There is no evidence of arthropathy or other focal bone abnormality. Soft tissues are unremarkable. IMPRESSION: Negative. Electronically Signed   By: Lucrezia Europe M.D.   On: 06/04/2021 09:10    Procedures Procedures   Medications Ordered in ED Medications  ibuprofen (ADVIL) tablet 800 mg (800 mg Oral Given 06/04/21 1517)    ED Course  I have reviewed the triage vital signs and the nursing notes.  Pertinent labs & imaging results that were available during my care of the patient were reviewed by me and considered in my medical decision making (see chart for details).    MDM Rules/Calculators/A&P                          Patient presenting for evaluation of left foot pain and swelling.  On exam, patient appears neurovascularly intact.  She does have pain and swelling in the mid metatarsal area. Will order xrays to r/o fx.   X-rays viewed and independently interpreted by me, no fracture dislocation.  Discussed findings with patient.  Discussed likely sprain.  Discussed symptomatic management as well as use of postop shoe and crutches as needed for pain control.  Follow-up with orthopedics if symptoms are not improving.  At this time, patient appears safe for discharge.  Return precautions given.  Patient states he understands and agrees to plan.  Final Clinical Impression(s) / ED Diagnoses Final diagnoses:  Foot pain, left    Rx / DC Orders ED Discharge Orders     None        Franchot Heidelberg,  PA-C 06/04/21 0941    Lacretia Leigh, MD 06/04/21 1459

## 2021-06-04 NOTE — ED Triage Notes (Addendum)
Pt arrived via POV with c/o left foot pain. Pt reports getting her foot caught between a pallet jack. Pt reports pain 8/10 and using ice and tylenol with no relief.

## 2021-06-04 NOTE — ED Notes (Signed)
Pt refused crutches.

## 2021-06-04 NOTE — Discharge Instructions (Addendum)
Take ibuprofen 3 times a day with meals as needed for pain.  Do not take other anti-inflammatories at the same time (Advil, Motrin, naproxen, Aleve). You may supplement with Tylenol if you need further pain control. Use ice packs, 3 times a day, for 20 minutes at a time to help with pain and swelling. Keep your foot elevated. Wear the postop shoe and use crutches as needed for pain control. Follow-up with orthopedic doctor if your symptoms not proving. Return to the emergency room with any new, worsening, concerning symptoms.

## 2021-08-27 ENCOUNTER — Telehealth: Payer: BC Managed Care – PPO | Admitting: Physician Assistant

## 2021-08-27 DIAGNOSIS — H60391 Other infective otitis externa, right ear: Secondary | ICD-10-CM

## 2021-08-28 MED ORDER — NEOMYCIN-POLYMYXIN-HC 3.5-10000-1 OT SOLN: 3.0000 [drp] | Freq: Four times a day (QID) | OTIC | 0 refills | Status: DC

## 2021-08-28 NOTE — Progress Notes (Signed)
E Visit for Swimmer's Ear  We are sorry that you are not feeling well. Here is how we plan to help!  Based on what you have shared with me it looks like you have swimmers ear. Swimmer's ear is a redness or swelling, irritation, or infection of your outer ear canal.  These symptoms usually occur within a few days of swimming.  Your ear canal is a tube that goes from the opening of the ear to the eardrum.  When water stays in your ear canal, germs can grow.  This is a painful condition that often happens to children and swimmers of all ages.  It is not contagious and oral antibiotics are not required to treat uncomplicated swimmer's ear.  The usual symptoms include: Itching inside the ear, Redness or a sense of swelling in the ear, Pain when the ear is tugged on when pressure is placed on the ear, Pus draining from the infected ear. and I have prescribed: Neomycin 0.35%, polymyxin B 10,000 units/mL, and hydrocortisone 0,5% otic solution 4 drops in affected ears four times a day for 7 days  In certain cases swimmer's ear may progress to a more serious bacterial infection of the middle or inner ear.  If you have a fever 102 and up and significantly worsening symptoms, this could indicate a more serious infection moving to the middle/inner and needs face to face evaluation in an office by a provider.  Your symptoms should improve over the next 3 days and should resolve in about 7 days.  HOME CARE:  Wash your hands frequently. Do not place the tip of the bottle on your ear or touch it with your fingers. You can take Acetominophen 650 mg every 4-6 hours as needed for pain.  If pain is severe or moderate, you can apply a heating pad (set on low) or hot water bottle (wrapped in a towel) to outer ear for 20 minutes.  This will also increase drainage. Avoid ear plugs Do not use Q-tips After showers, help the water run out by tilting your head to one side.  GET HELP RIGHT AWAY IF:  Fever is over 102.2  degrees. You develop progressive ear pain or hearing loss. Ear symptoms persist longer than 3 days after treatment.  MAKE SURE YOU:  Understand these instructions. Will watch your condition. Will get help right away if you are not doing well or get worse.  TO PREVENT SWIMMER'S EAR: Use a bathing cap or custom fitted swim molds to keep your ears dry. Towel off after swimming to dry your ears. Tilt your head or pull your earlobes to allow the water to escape your ear canal. If there is still water in your ears, consider using a hairdryer on the lowest setting.   Thank you for choosing an e-visit.  Your e-visit answers were reviewed by a board certified advanced clinical practitioner to complete your personal care plan. Depending upon the condition, your plan could have included both over the counter or prescription medications.  Please review your pharmacy choice. Make sure the pharmacy is open so you can pick up prescription now. If there is a problem, you may contact your provider through MyChart messaging and have the prescription routed to another pharmacy.  Your safety is important to us. If you have drug allergies check your prescription carefully.   For the next 24 hours you can use MyChart to ask questions about today's visit, request a non-urgent call back, or ask for a work or   or school excuse. You will get an email in the next two days asking about your experience. I hope that your e-visit has been valuable and will speed your recovery.   I provided 6 minutes of non face-to-face time during this encounter for chart review and documentation.

## 2021-11-27 ENCOUNTER — Telehealth: Payer: Medicaid Other | Admitting: Physician Assistant

## 2021-11-27 DIAGNOSIS — K047 Periapical abscess without sinus: Secondary | ICD-10-CM

## 2021-11-27 DIAGNOSIS — K0889 Other specified disorders of teeth and supporting structures: Secondary | ICD-10-CM

## 2021-11-27 MED ORDER — AMOXICILLIN 500 MG PO CAPS: 500.0000 mg | ORAL_CAPSULE | Freq: Three times a day (TID) | ORAL | 0 refills | Status: AC

## 2021-11-27 MED ORDER — IBUPROFEN 600 MG PO TABS: 600.0000 mg | ORAL_TABLET | Freq: Three times a day (TID) | ORAL | 0 refills | Status: DC | PRN

## 2021-11-27 NOTE — Progress Notes (Signed)

## 2021-12-08 ENCOUNTER — Telehealth: Payer: Medicaid Other | Admitting: Physician Assistant

## 2021-12-08 DIAGNOSIS — K0889 Other specified disorders of teeth and supporting structures: Secondary | ICD-10-CM

## 2021-12-08 DIAGNOSIS — K047 Periapical abscess without sinus: Secondary | ICD-10-CM

## 2021-12-08 MED ORDER — AMOXICILLIN 500 MG PO CAPS: 500.0000 mg | ORAL_CAPSULE | Freq: Three times a day (TID) | ORAL | 0 refills | Status: AC

## 2021-12-08 MED ORDER — IBUPROFEN 600 MG PO TABS: 600.0000 mg | ORAL_TABLET | Freq: Three times a day (TID) | ORAL | 0 refills | Status: AC | PRN

## 2021-12-08 NOTE — Progress Notes (Signed)

## 2022-01-14 ENCOUNTER — Encounter (HOSPITAL_COMMUNITY): Payer: Self-pay | Admitting: Emergency Medicine

## 2022-01-14 ENCOUNTER — Emergency Department (HOSPITAL_COMMUNITY)
Admission: EM | Admit: 2022-01-14 | Discharge: 2022-01-15 | Disposition: A | Discharge status: Home / Self Care | Payer: Medicaid Other | Attending: Emergency Medicine | Admitting: Emergency Medicine

## 2022-01-14 DIAGNOSIS — H6591 Unspecified nonsuppurative otitis media, right ear: Secondary | ICD-10-CM

## 2022-01-14 DIAGNOSIS — H73891 Other specified disorders of tympanic membrane, right ear: Secondary | ICD-10-CM | POA: Insufficient documentation

## 2022-01-14 MED ORDER — AMOXICILLIN 500 MG PO CAPS
500.0000 mg | ORAL_CAPSULE | Freq: Once | ORAL | Status: AC
  Administered 2022-01-15: 500 mg via ORAL
  Filled 2022-01-14: qty 1

## 2022-01-14 NOTE — ED Triage Notes (Signed)
Patient c/o R ear pain x1 week. States blood draining from ear, R facial swelling, and decreased appetite.

## 2022-01-14 NOTE — ED Provider Notes (Signed)
[facility] DEPT Provider Note   CSN: [id number] Arrival date & time: 01/14/22  2225     History  Chief Complaint  Patient presents with   Otalgia    Maria Sampson is a 31 y.o. female.  The history is provided by the patient and medical records.  Otalgia  31 year old female here with right ear pain for the past week.  States intermittently when she wakes up in the morning she has some swelling along the outside of the ear and on the right side of her face.  She reports decreased appetite and some mild nausea recently.  She denies any sore throat, cough, congestion, or other URI symptoms.  No fever but has had subjective chills.  Girlfriend also with similar symptoms, seen at urgent care today with negative COVID and flu testing but was diagnosed with an ear infection.  States symptoms seem better when putting cotton in her ears, has noticed small traces of blood when removing.  She denies any facial trauma or direct trauma to the ear.  Home Medications Prior to Admission medications   Medication Sig Start Date End Date Taking? Authorizing Provider  amoxicillin (AMOXIL) 500 MG capsule Take 1 capsule (500 mg total) by mouth 3 (three) times daily. 01/15/22  Yes Larene Pickett, PA-C  acetaminophen (TYLENOL) 500 MG tablet Take 500 mg by mouth every 6 (six) hours as needed for moderate pain.    [provider]  ibuprofen (ADVIL) 600 MG tablet Take 1 tablet (600 mg total) by mouth every 8 (eight) hours as needed. 12/08/21   Mar Daring, PA-C  neomycin-polymyxin-hydrocortisone (CORTISPORIN) OTIC solution Place 3 drops into the right ear 4 (four) times daily. 08/28/21   Mar Daring, PA-C      Allergies    Peanut-containing drug products    Review of Systems   Review of Systems  HENT:  Positive for ear pain.   All other systems reviewed and are negative.  Physical Exam Updated Vital Signs BP (!) 166/130 (BP Location: Left Arm)     Pulse 69    Temp 98.7 F (37.1 C) (Oral)    Resp 17    Ht 5\' 10"  (1.778 m)    Wt 94.8 kg    SpO2 99%    BMI 29.99 kg/m   Physical Exam Vitals and nursing note reviewed.  Constitutional:      Appearance: She is well-developed.  HENT:     Head: Normocephalic and atraumatic.     Comments: No facial swelling, no lymphadenopathy    Ears:     Comments: Left ear is normal Right TM is fully intact, no bulging, does have middle ear effusion, canal without any signs of trauma, no blood or purulent drainage in canal Eyes:     Conjunctiva/sclera: Conjunctivae normal.     Pupils: Pupils are equal, round, and reactive to light.  Cardiovascular:     Rate and Rhythm: Normal rate and regular rhythm.     Heart sounds: Normal heart sounds.  Pulmonary:     Effort: Pulmonary effort is normal.     Breath sounds: Normal breath sounds.  Abdominal:     General: Bowel sounds are normal.     Palpations: Abdomen is soft.  Musculoskeletal:        General: Normal range of motion.     Cervical back: Normal range of motion.  Skin:    General: Skin is warm and dry.  Neurological:  Mental Status: She is alert and oriented to person, place, and time.    ED Results / Procedures / Treatments   Labs (all labs ordered are listed, but only abnormal results are displayed) Labs Reviewed - No data to display  EKG None  Radiology No results found.  Procedures Procedures    Medications Ordered in ED Medications  amoxicillin (AMOXIL) capsule 500 mg (has no administration in time range)    ED Course/ Medical Decision Making/ A&P                           Medical Decision Making Risk Prescription drug management.   31 year old female here with right ear pain for the past week.  Intermittently seeing some blood from tissue placed in canal.  She is afebrile and nontoxic in appearance here.  Left ear is normal.  Right ear with effusion but TM is fully intact without erythema or bulging.  No acute  findings in the canal such as bleeding or drainage.  She has no facial swelling.  As girlfriend recently sick with similar, offered COVID and flu testing but she declined.  Will start on course of amoxicillin to see if this helps.  Can follow-up with PCP, also given ENT follow-up.  Can return here for new concerns.  Final Clinical Impression(s) / ED Diagnoses Final diagnoses:  Fluid level behind tympanic membrane of right ear    Rx / DC Orders ED Discharge Orders          Ordered    amoxicillin (AMOXIL) 500 MG capsule  3 times daily        01/15/22 0006              Larene Pickett, PA-C 01/15/22 0039    Quintella Reichert, MD 01/15/22 [phone]

## 2022-01-15 MED ORDER — ACETAMINOPHEN 500 MG PO TABS
1000.0000 mg | ORAL_TABLET | Freq: Once | ORAL | Status: AC
  Administered 2022-01-15: 1000 mg via ORAL
  Filled 2022-01-15: qty 2

## 2022-01-15 MED ORDER — AMOXICILLIN 500 MG PO CAPS: 500.0000 mg | ORAL_CAPSULE | Freq: Three times a day (TID) | ORAL | 0 refills | Status: DC

## 2022-01-15 NOTE — Discharge Instructions (Addendum)
Take the prescribed medication as directed. Follow-up with your primary care doctor.  Have also attached information for ENT office if needed. Return to the ED for new or worsening symptoms.

## 2022-03-26 ENCOUNTER — Telehealth: Payer: Self-pay | Admitting: Family

## 2022-03-26 DIAGNOSIS — H9209 Otalgia, unspecified ear: Secondary | ICD-10-CM

## 2022-03-26 DIAGNOSIS — R6884 Jaw pain: Secondary | ICD-10-CM

## 2022-03-26 DIAGNOSIS — R519 Headache, unspecified: Secondary | ICD-10-CM

## 2022-03-26 NOTE — Progress Notes (Signed)
Based on what you shared with me, I feel your condition warrants further evaluation and I recommend that you be seen in a face to face visit. ? ?Given your symptoms, it would be best to be seen in person to be evaluated in person to have a physical exam.  ?  ?NOTE: There will be NO CHARGE for this eVisit ?  ?If you are having a true medical emergency please call 911.   ?  ? For an urgent face to face visit, [facility] has six urgent care centers for your convenience:  ?  ? [facility] ?Get Driving Directions ?[phone] ?[address] ?[place], [place] [address] ?  ? [facility]) ?Get Driving Directions ?[phone] ?[address] ?[place], [place] [address] ? ?[facility] ?Get Driving Directions ?[place]  [address] ? ?[facility] ?Get Driving Directions ?[phone] ?[address] ?[place], [place] [address] ?  ?[facility] ?Get Driving Directions  ?[phone] ?[address] ?[address] ?[place], [place] [address] ?  ?[facility] ?Get Driving Directions ?[phone] ?[address] ?[place],  [address] ? ?Your MyChart E-visit questionnaire answers were reviewed by a board certified advanced clinical practitioner to complete your personal care plan based on your specific symptoms.  Thank you for using e-Visits. ?  ? ?

## 2022-04-14 ENCOUNTER — Encounter (HOSPITAL_COMMUNITY): Payer: Self-pay

## 2022-04-14 ENCOUNTER — Emergency Department (HOSPITAL_COMMUNITY)
Admission: EM | Admit: 2022-04-14 | Discharge: 2022-04-14 | Disposition: A | Discharge status: Home / Self Care | Payer: Self-pay | Attending: Emergency Medicine | Admitting: Emergency Medicine

## 2022-04-14 ENCOUNTER — Other Ambulatory Visit: Payer: Self-pay

## 2022-04-14 DIAGNOSIS — R0981 Nasal congestion: Secondary | ICD-10-CM | POA: Insufficient documentation

## 2022-04-14 DIAGNOSIS — R200 Anesthesia of skin: Secondary | ICD-10-CM | POA: Insufficient documentation

## 2022-04-14 DIAGNOSIS — J3489 Other specified disorders of nose and nasal sinuses: Secondary | ICD-10-CM | POA: Insufficient documentation

## 2022-04-14 DIAGNOSIS — R220 Localized swelling, mass and lump, head: Secondary | ICD-10-CM

## 2022-04-14 DIAGNOSIS — Z91010 Allergy to peanuts: Secondary | ICD-10-CM | POA: Insufficient documentation

## 2022-04-14 DIAGNOSIS — K0889 Other specified disorders of teeth and supporting structures: Secondary | ICD-10-CM | POA: Insufficient documentation

## 2022-04-14 DIAGNOSIS — R600 Localized edema: Secondary | ICD-10-CM | POA: Insufficient documentation

## 2022-04-14 MED ORDER — AMOXICILLIN-POT CLAVULANATE 875-125 MG PO TABS: 1.0000 | ORAL_TABLET | Freq: Two times a day (BID) | ORAL | 0 refills | Status: AC

## 2022-04-14 MED ORDER — AMOXICILLIN-POT CLAVULANATE 875-125 MG PO TABS
1.0000 | ORAL_TABLET | Freq: Once | ORAL | Status: AC
  Administered 2022-04-14: 1 via ORAL
  Filled 2022-04-14: qty 1

## 2022-04-14 MED ORDER — LIDOCAINE VISCOUS HCL 2 % MT SOLN: 15.0000 mL | OROMUCOSAL | 0 refills | Status: AC | PRN

## 2022-04-14 NOTE — ED Notes (Signed)
Pt ambulatory without assistance.  

## 2022-04-14 NOTE — ED Provider Notes (Signed)
[facility] DEPT Provider Note   CSN: [id number] Arrival date & time: 04/14/22  0024     History  Chief Complaint  Patient presents with   Facial Swelling    Maria Sampson is a 31 y.o. female with a medical history of depression.  Presents to the emergency department with a chief complaint of dental pain, facial swelling, and facial numbness.  Patient reports that she has been dealing with dental pain and facial swelling since the beginning of May.  Facial swelling has gradually gotten worse over this time.  Patient states that recently she developed numbness to her right cheek.  Patient reports that numbness is present all the time.  Patient does experience pain when pushing on her right cheek or opening her jaw completely open.  Patient also endorses nasal congestion and rhinorrhea.  Patient denies any recent dental injury.  Denies any fever, chills, trismus, drooling, high potato voice, trouble swallowing, trouble breathing, neck pain, neck stiffness.  HPI     Home Medications Prior to Admission medications   Medication Sig Start Date End Date Taking? Authorizing Provider  acetaminophen (TYLENOL) 500 MG tablet Take 500 mg by mouth every 6 (six) hours as needed for moderate pain.    [provider]  amoxicillin (AMOXIL) 500 MG capsule Take 1 capsule (500 mg total) by mouth 3 (three) times daily. 01/15/22   Larene Pickett, PA-C  ibuprofen (ADVIL) 600 MG tablet Take 1 tablet (600 mg total) by mouth every 8 (eight) hours as needed. 12/08/21   Mar Daring, PA-C  neomycin-polymyxin-hydrocortisone (CORTISPORIN) OTIC solution Place 3 drops into the right ear 4 (four) times daily. 08/28/21   Mar Daring, PA-C      Allergies    Peanut-containing drug products    Review of Systems   Review of Systems  Constitutional:  Negative for chills and fever.  HENT:  Positive for congestion, dental problem, facial swelling and rhinorrhea.  Negative for sore throat, trouble swallowing and voice change.   Eyes:  Negative for visual disturbance.  Respiratory:  Negative for shortness of breath.   Cardiovascular:  Negative for chest pain.  Gastrointestinal:  Negative for abdominal pain, nausea and vomiting.  Genitourinary:  Negative for difficulty urinating and dysuria.  Musculoskeletal:  Negative for back pain, neck pain and neck stiffness.  Skin:  Negative for color change and rash.  Neurological:  Positive for numbness. Negative for dizziness, syncope, light-headedness and headaches.  Psychiatric/Behavioral:  Negative for confusion.    Physical Exam Updated Vital Signs BP (!) 175/116 (BP Location: Right Arm)   Pulse 73   Temp 98.2 F (36.8 C) (Oral)   Resp 18   Ht '5\' 10"'$  (1.778 m)   Wt 94.8 kg   SpO2 100%   BMI 29.99 kg/m  Physical Exam Vitals and nursing note reviewed.  Constitutional:      General: She is not in acute distress.    Appearance: She is not ill-appearing, toxic-appearing or diaphoretic.  HENT:     Head: Normocephalic.     Jaw: Tenderness and swelling present. No trismus, pain on movement or malocclusion.     Mouth/Throat:     Lips: Pink. No lesions.     Mouth: Mucous membranes are moist.     Dentition: Gingival swelling present. No dental abscesses.     Tongue: No lesions. Tongue does not deviate from midline.     Palate: No mass and lesions.     Pharynx: Oropharynx  is clear. Uvula midline. No pharyngeal swelling, oropharyngeal exudate, posterior oropharyngeal erythema or uvula swelling.     Tonsils: No tonsillar exudate or tonsillar abscesses. 1+ on the right. 1+ on the left.     Comments: Right buccal swelling noted.  Patient has tenderness to right upper jaw.  No obvious dental abscess.  Patient has multiple fillings and a few missing teeth.  Handles oral secretions without difficulty.  No swelling to floor of mouth. Eyes:     General: No scleral icterus.       Right eye: No discharge.         Left eye: No discharge.  Neck:     Comments: No swelling to submandibular space. Cardiovascular:     Rate and Rhythm: Normal rate.  Pulmonary:     Effort: Pulmonary effort is normal.     Comments: Speaks in full complete sentences without difficulty Musculoskeletal:     Cervical back: Full passive range of motion without pain, normal range of motion and neck supple. No edema, erythema, signs of trauma, rigidity, torticollis or crepitus. No pain with movement, spinous process tenderness or muscular tenderness. Normal range of motion.  Skin:    General: Skin is warm and dry.  Neurological:     General: No focal deficit present.     Mental Status: She is alert.     Comments: Patient reports decree sensation over right buccal area.  No other cranial nerve deficits noted.  Psychiatric:        Behavior: Behavior is cooperative.    ED Results / Procedures / Treatments   Labs (all labs ordered are listed, but only abnormal results are displayed) Labs Reviewed - No data to display  EKG None  Radiology No results found.  Procedures Procedures    Medications Ordered in ED Medications - No data to display  ED Course/ Medical Decision Making/ A&P                           Medical Decision Making  Alert 31 year old female no acute distress, nontoxic-appearing.  Presents to the emergency department the chief complaint of dental pain, facial swelling, and facial numbness.  Information obtained from patient and patient's family mother at bedside.  Past medical records were reviewed including previous prior notes.    Per chart review patient had an ED visit on 5/8 with reports of dental pain and facial swelling.  Patient was advised to come to the office for face-to-face visit however did not due to not having insurance.  With dental pain and buccal swelling suspect odontogenic source of infection.  We will start patient on course of Augmentin and have her follow-up with dentist in  outpatient setting.  Additionally will give patient prescription for viscous lidocaine to form swish and spit to help with her dental pain.  Ludewig's angina and deep space neck infection were considered however patient has no swelling to submandibular space, trismus, drooling, high potato voice, pain with passive range of motion of neck.  Patient care discussed with attending physician Dr.Bero.  Based on patient's chief complaint, I considered admission might be necessary, however after reassuring ED workup feel patient is reasonable for discharge.  Discussed results, findings, treatment and follow up. Patient advised of return precautions. Patient verbalized understanding and agreed with plan.  Portions of this note were generated with [employer]. Dictation errors may occur despite best attempts at proofreading.  Final Clinical Impression(s) / ED Diagnoses Final diagnoses:  None    Rx / DC Orders ED Discharge Orders     None         Dyann Ruddle 04/14/22 0109    Maudie Flakes, MD 04/14/22 0730

## 2022-04-14 NOTE — ED Notes (Addendum)
I provided reinforced discharge education based off of discharge instructions. Pt acknowledged and understood my education. Pt had no further questions/concerns for provider/myself. During my care of this pt, I informed RN of care, treatments and updates to pt condition. I had RN assist when required with treatment, and provide appropriate initial/necessary assessments.

## 2022-04-14 NOTE — Discharge Instructions (Addendum)
You came to the emergency department today to be evaluated for your facial swelling and dental pain.  Your symptoms are likely due to a dental infection.  Due to this I have given you prescription for Augmentin, please take this medication as prescribed.  It is very important that you follow-up with a dentist.  Please call one of the dentists on the attached paperwork to schedule a follow-up appointment.  You may have diarrhea from the antibiotics.  It is very important that you continue to take the antibiotics even if you get diarrhea unless a medical professional tells you that you may stop taking them.  If you stop too early the bacteria you are being treated for will become stronger and you may need different, more powerful antibiotics that have more side effects and worsening diarrhea.  Please stay well hydrated and consider probiotics as they may decrease the severity of your diarrhea.  Please be aware that if you take any hormonal contraception (birth control pills, nexplanon, the ring, etc) that your birth control will not work while you are taking antibiotics and you need to use back up protection as directed on the birth control medication information insert.    Get help right away if: Your symptoms suddenly get worse. You have a very bad headache. You have problems breathing or swallowing. You have trouble opening your mouth. You have swelling in your neck or around your eye.

## 2022-04-14 NOTE — ED Triage Notes (Signed)
Pt has swelling to the right side of her face that started 2 weeks ago. Pt states that it is numb unless she touches it.

## 2022-05-17 IMAGING — DX DG FOOT COMPLETE 3+V*L*
3 series · 3 of 3 positions shown · non-contrast
Comparison: None.

CLINICAL DATA: Pain post work injury

EXAM:
LEFT FOOT - COMPLETE 3+ VIEW

[foot ap]
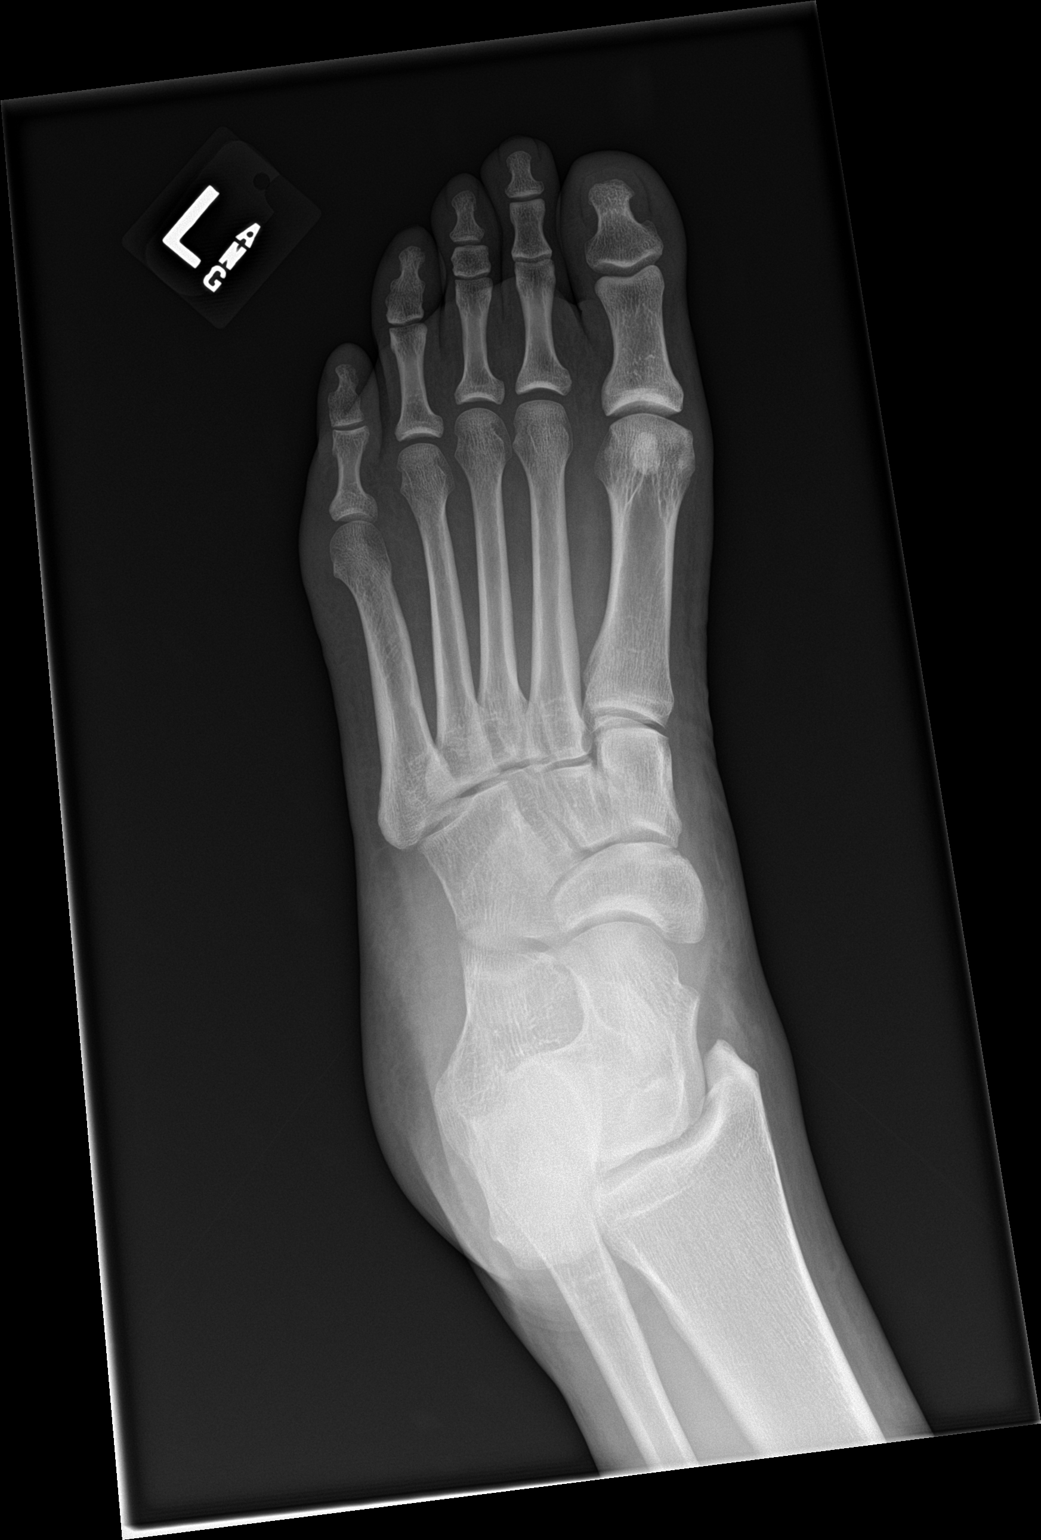

[foot obl]
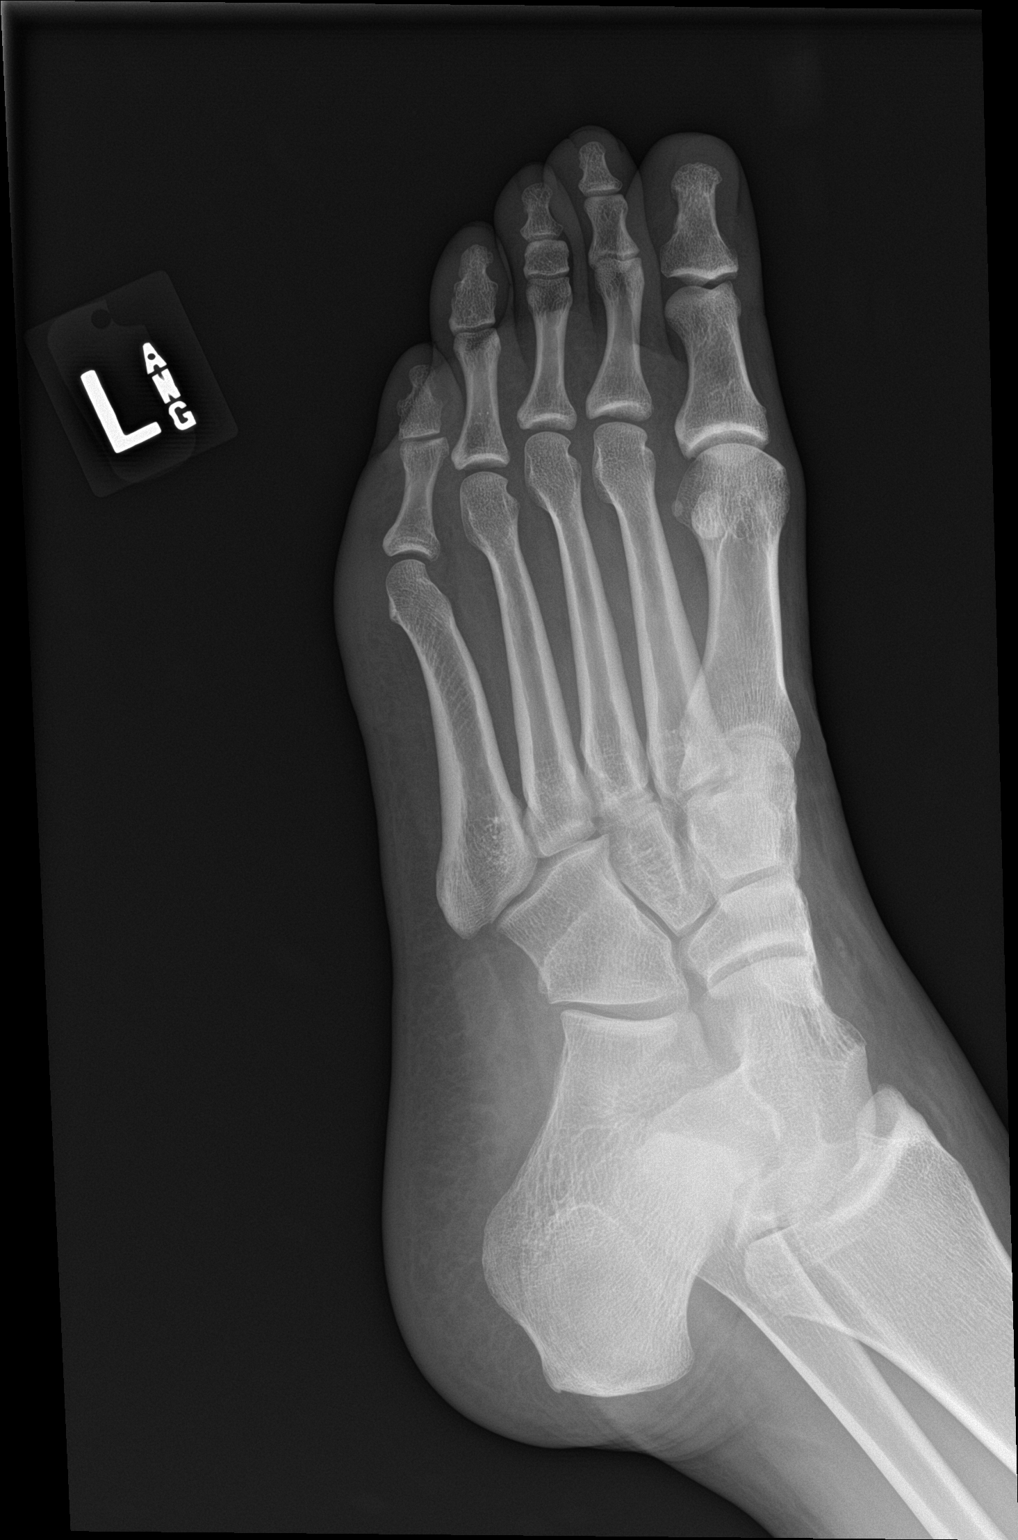

[foot lat]
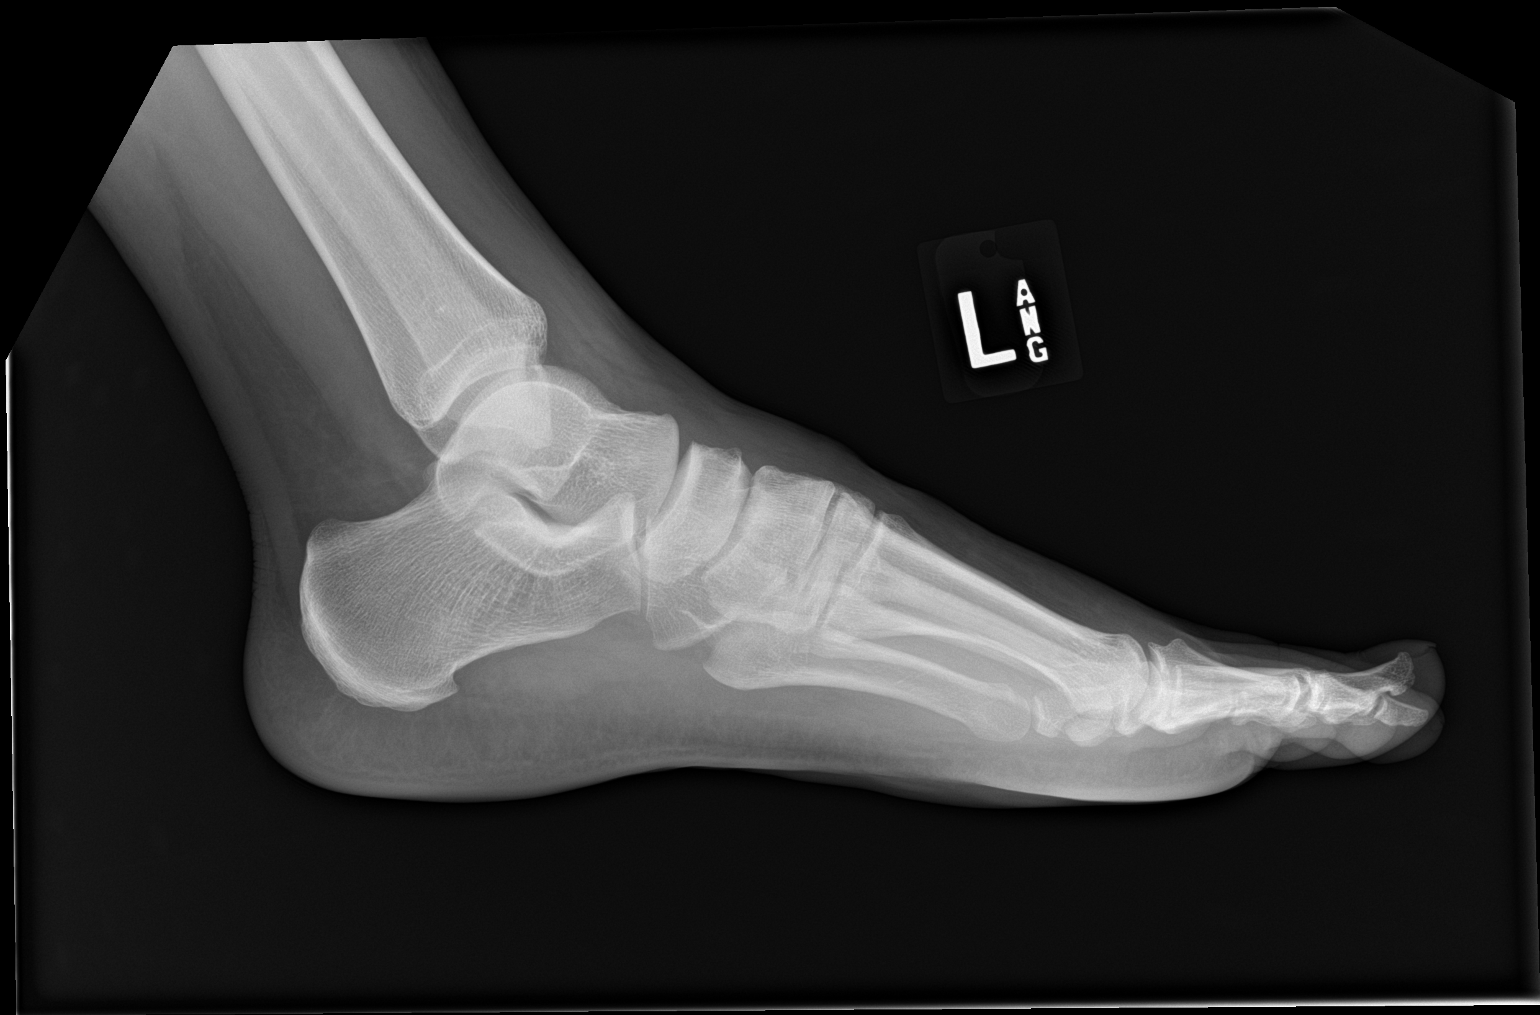

[3 of 3 positions shown; findings below may reference images not displayed]

FINDINGS: There is no evidence of fracture or dislocation. There is no
evidence of arthropathy or other focal bone abnormality. Soft
tissues are unremarkable.
IMPRESSION: Negative.

## 2022-05-20 ENCOUNTER — Telehealth: Payer: Self-pay | Admitting: Nurse Practitioner

## 2022-05-20 DIAGNOSIS — M545 Low back pain, unspecified: Secondary | ICD-10-CM

## 2022-05-20 MED ORDER — NAPROXEN 500 MG PO TABS: 500.0000 mg | ORAL_TABLET | Freq: Two times a day (BID) | ORAL | 0 refills | Status: DC

## 2022-05-20 MED ORDER — CYCLOBENZAPRINE HCL 10 MG PO TABS: 10.0000 mg | ORAL_TABLET | Freq: Three times a day (TID) | ORAL | 0 refills | Status: DC | PRN

## 2022-05-20 NOTE — Progress Notes (Signed)

## 2022-06-06 ENCOUNTER — Telehealth: Payer: Self-pay | Admitting: Physician Assistant

## 2022-06-06 DIAGNOSIS — J4521 Mild intermittent asthma with (acute) exacerbation: Secondary | ICD-10-CM

## 2022-06-06 NOTE — Progress Notes (Signed)
Because of no diagnosis of asthma and new symptoms, I feel your condition warrants further evaluation and I recommend that you be seen in a face to face visit. This way you can get a lung examination to make sure there is no concern for infection, etc and be started on proper medications.    NOTE: There will be NO CHARGE for this eVisit   If you are having a true medical emergency please call 911.      For an urgent face to face visit, [facility] [place] has seven urgent care centers for your convenience:     [facility] [place] Get Driving Directions [phone] [address] [place], [place] [address]    [facility]) Get Driving Directions [phone] [place], [place] [address]  [facility] Get Driving Directions [phone] [address] [place],  [place]  [address]  [facility] [address] [place] Get Driving Directions  [phone] [address] [place],  [place] [address]   [facility] Get Driving Directions [phone] [address], [place], [place] [address]   [facility] Get Driving Directions  [phone] [address] [place], [place] [address]   [facility] [place] Get Driving Directions [phone] [address], [place], [place] [address]  Your MyChart E-visit questionnaire answers were reviewed by a board certified advanced clinical practitioner to complete your personal care plan based on your specific symptoms.  Thank you for using e-Visits.

## 2022-07-16 ENCOUNTER — Telehealth: Payer: Self-pay | Admitting: Nurse Practitioner

## 2022-07-16 DIAGNOSIS — J069 Acute upper respiratory infection, unspecified: Secondary | ICD-10-CM

## 2022-07-16 MED ORDER — BENZONATATE 100 MG PO CAPS: 100.0000 mg | ORAL_CAPSULE | Freq: Three times a day (TID) | ORAL | 0 refills | Status: AC | PRN

## 2022-07-16 MED ORDER — FLUTICASONE PROPIONATE 50 MCG/ACT NA SUSP: 2.0000 | Freq: Every day | NASAL | 6 refills | Status: AC

## 2022-07-16 NOTE — Progress Notes (Signed)
E-Visit for Upper Respiratory Infection   COVID is a type of URI, we would recommend when you are able getting tested for COVID-19  Based on what you have shared with me, it looks like you may have a viral upper respiratory infection.  Upper respiratory infections are caused by a large number of viruses; however, rhinovirus is the most common cause.   Symptoms vary from person to person, with common symptoms including sore throat, cough, fatigue or lack of energy and feeling of general discomfort.  A low-grade fever of up to 100.4 may present, but is often uncommon.  Symptoms vary however, and are closely related to a person's age or underlying illnesses.  The most common symptoms associated with an upper respiratory infection are nasal discharge or congestion, cough, sneezing, headache and pressure in the ears and face.  These symptoms usually persist for about 3 to 10 days, but can last up to 2 weeks.  It is important to know that upper respiratory infections do not cause serious illness or complications in most cases.    Upper respiratory infections can be transmitted from person to person, with the most common method of transmission being a person's hands.  The virus is able to live on the skin and can infect other persons for up to 2 hours after direct contact.  Also, these can be transmitted when someone coughs or sneezes; thus, it is important to cover the mouth to reduce this risk.  To keep the spread of the illness at [facility], good hand hygiene is very important.  This is an infection that is most likely caused by a virus. There are no specific treatments other than to help you with the symptoms until the infection runs its course.  We are sorry you are not feeling well.  Here is how we plan to help!   For nasal congestion, you may use an oral decongestants such as Mucinex D or if you have glaucoma or high blood pressure use plain Mucinex.  Saline nasal spray or nasal drops can help and can safely  be used as often as needed for congestion.  For your congestion, I have prescribed Fluticasone nasal spray one spray in each nostril twice a day  If you do not have a history of heart disease, hypertension, diabetes or thyroid disease, prostate/bladder issues or glaucoma, you may also use Sudafed to treat nasal congestion.  It is highly recommended that you consult with a pharmacist or your primary care physician to ensure this medication is safe for you to take.     If you have a cough, you may use cough suppressants such as Delsym and Robitussin.  If you have glaucoma or high blood pressure, you can also use Coricidin HBP.   For cough I have prescribed for you A prescription cough medication called Tessalon Perles 100 mg. You may take 1-2 capsules every 8 hours as needed for cough  If you have a sore or scratchy throat, use a saltwater gargle-  to  teaspoon of salt dissolved in a 4-ounce to 8-ounce glass of warm water.  Gargle the solution for approximately 15-30 seconds and then spit.  It is important not to swallow the solution.  You can also use throat lozenges/cough drops and Chloraseptic spray to help with throat pain or discomfort.  Warm or cold liquids can also be helpful in relieving throat pain.  For headache, pain or general discomfort, you can use Ibuprofen or Tylenol as directed.   Some authorities  believe that zinc sprays or the use of Echinacea may shorten the course of your symptoms.   HOME CARE Only take medications as instructed by your medical team. Be sure to drink plenty of fluids. Water is fine as well as fruit juices, sodas and electrolyte beverages. You may want to stay away from caffeine or alcohol. If you are nauseated, try taking small sips of liquids. How do you know if you are getting enough fluid? Your urine should be a pale yellow or almost colorless. Get rest. Taking a steamy shower or using a humidifier may help nasal congestion and ease sore throat pain. You can  place a towel over your head and breathe in the steam from hot water coming from a faucet. Using a saline nasal spray works much the same way. Cough drops, hard candies and sore throat lozenges may ease your cough. Avoid close contacts especially the very young and the elderly Cover your mouth if you cough or sneeze Always remember to wash your hands.   GET HELP RIGHT AWAY IF: You develop worsening fever. If your symptoms do not improve within 10 days You develop yellow or green discharge from your nose over 3 days. You have coughing fits You develop a severe head ache or visual changes. You develop shortness of breath, difficulty breathing or start having chest pain Your symptoms persist after you have completed your treatment plan  MAKE SURE YOU  Understand these instructions. Will watch your condition. Will get help right away if you are not doing well or get worse.  Thank you for choosing an e-visit.  Your e-visit answers were reviewed by a board certified advanced clinical practitioner to complete your personal care plan. Depending upon the condition, your plan could have included both over the counter or prescription medications.  Please review your pharmacy choice. Make sure the pharmacy is open so you can pick up prescription now. If there is a problem, you may contact your provider through [employer] and have the prescription routed to another pharmacy.  Your safety is important to [place]. If you have drug allergies check your prescription carefully.   For the next 24 hours you can use MyChart to ask questions about today's visit, request a non-urgent call back, or ask for a work or school excuse. You will get an email in the next two days asking about your experience. I hope that your e-visit has been valuable and will speed your recovery.  Meds ordered this encounter  Medications   benzonatate (TESSALON) 100 MG capsule    Sig: Take 1 capsule (100 mg total) by mouth 3  (three) times daily as needed.    Dispense:  30 capsule    Refill:  0   fluticasone (FLONASE) 50 MCG/ACT nasal spray    Sig: Place 2 sprays into both nostrils daily.    Dispense:  16 g    Refill:  6     I spent approximately 5 minutes reviewing the patient's history, current symptoms and coordinating their plan of care today.

## 2023-01-15 ENCOUNTER — Telehealth: Payer: Self-pay | Admitting: Physician Assistant

## 2023-01-15 DIAGNOSIS — R6889 Other general symptoms and signs: Secondary | ICD-10-CM

## 2023-01-15 MED ORDER — OSELTAMIVIR PHOSPHATE 75 MG PO CAPS: 75.0000 mg | ORAL_CAPSULE | Freq: Two times a day (BID) | ORAL | 0 refills | Status: AC

## 2023-01-15 NOTE — Progress Notes (Signed)

## 2023-12-12 ENCOUNTER — Telehealth: Payer: Medicaid Other | Admitting: Physician Assistant

## 2023-12-12 DIAGNOSIS — M542 Cervicalgia: Secondary | ICD-10-CM | POA: Diagnosis not present

## 2023-12-12 DIAGNOSIS — M5441 Lumbago with sciatica, right side: Secondary | ICD-10-CM | POA: Diagnosis not present

## 2023-12-12 MED ORDER — CYCLOBENZAPRINE HCL 10 MG PO TABS: 10.0000 mg | ORAL_TABLET | Freq: Three times a day (TID) | ORAL | 0 refills | Status: AC | PRN

## 2023-12-12 MED ORDER — NAPROXEN 500 MG PO TABS: 500.0000 mg | ORAL_TABLET | Freq: Two times a day (BID) | ORAL | 0 refills | Status: AC

## 2023-12-12 NOTE — Progress Notes (Signed)

## 2023-12-12 NOTE — Progress Notes (Signed)
I have spent 5 minutes in review of e-visit questionnaire, review and updating patient chart, medical decision making and response to patient.   Mia Milan Cody Jacklynn Dehaas, PA-C
